# Patient Record
Sex: Male | Born: 1965 | Race: White | Hispanic: No | Marital: Married | Smoking: Never smoker
Health system: Southern US, Community
[De-identification: ages and names within clinical notes are randomized; demographics above are authoritative.]

## PROBLEM LIST (undated history)

## (undated) DIAGNOSIS — H7190 Unspecified cholesteatoma, unspecified ear: Secondary | ICD-10-CM

## (undated) DIAGNOSIS — F4320 Adjustment disorder, unspecified: Secondary | ICD-10-CM

## (undated) DIAGNOSIS — F329 Major depressive disorder, single episode, unspecified: Secondary | ICD-10-CM

## (undated) DIAGNOSIS — F32A Depression, unspecified: Secondary | ICD-10-CM

## (undated) DIAGNOSIS — M1990 Unspecified osteoarthritis, unspecified site: Secondary | ICD-10-CM

## (undated) DIAGNOSIS — Z8249 Family history of ischemic heart disease and other diseases of the circulatory system: Secondary | ICD-10-CM

## (undated) DIAGNOSIS — G43009 Migraine without aura, not intractable, without status migrainosus: Secondary | ICD-10-CM

## (undated) DIAGNOSIS — Z8709 Personal history of other diseases of the respiratory system: Secondary | ICD-10-CM

## (undated) DIAGNOSIS — M4800 Spinal stenosis, site unspecified: Secondary | ICD-10-CM

## (undated) DIAGNOSIS — E785 Hyperlipidemia, unspecified: Secondary | ICD-10-CM

## (undated) DIAGNOSIS — J45909 Unspecified asthma, uncomplicated: Secondary | ICD-10-CM

## (undated) DIAGNOSIS — F319 Bipolar disorder, unspecified: Secondary | ICD-10-CM

## (undated) HISTORY — DX: Bipolar disorder, unspecified: F31.9

## (undated) HISTORY — DX: Hyperlipidemia, unspecified: E78.5

## (undated) HISTORY — DX: Migraine without aura, not intractable, without status migrainosus: G43.009

## (undated) HISTORY — DX: Unspecified cholesteatoma, unspecified ear: H71.90

## (undated) HISTORY — PX: SYNOVIAL CYST EXCISION: SUR507

## (undated) HISTORY — DX: Unspecified osteoarthritis, unspecified site: M19.90

## (undated) HISTORY — DX: Spinal stenosis, site unspecified: M48.00

## (undated) HISTORY — DX: Personal history of other diseases of the respiratory system: Z87.09

## (undated) HISTORY — PX: INNER EAR SURGERY: SHX679

## (undated) HISTORY — DX: Family history of ischemic heart disease and other diseases of the circulatory system: Z82.49

## (undated) HISTORY — DX: Adjustment disorder, unspecified: F43.20

---

## 1996-05-08 HISTORY — PX: OTHER SURGICAL HISTORY: SHX169

## 1996-05-08 LAB — HM COLONOSCOPY: HM Colonoscopy: NORMAL

## 1997-05-08 HISTORY — PX: COLONOSCOPY: SHX174

## 2000-05-08 HISTORY — PX: NECK SURGERY: SHX720

## 2001-05-08 HISTORY — PX: ESOPHAGUS SURGERY: SHX626

## 2004-06-28 ENCOUNTER — Encounter: Payer: Self-pay | Admitting: Family Medicine

## 2007-01-14 ENCOUNTER — Ambulatory Visit: Payer: Self-pay | Admitting: Family Medicine

## 2007-01-14 DIAGNOSIS — M542 Cervicalgia: Secondary | ICD-10-CM | POA: Insufficient documentation

## 2007-01-14 DIAGNOSIS — H9319 Tinnitus, unspecified ear: Secondary | ICD-10-CM | POA: Insufficient documentation

## 2007-01-14 DIAGNOSIS — H6690 Otitis media, unspecified, unspecified ear: Secondary | ICD-10-CM | POA: Insufficient documentation

## 2007-01-14 DIAGNOSIS — J45909 Unspecified asthma, uncomplicated: Secondary | ICD-10-CM | POA: Insufficient documentation

## 2007-01-14 DIAGNOSIS — F319 Bipolar disorder, unspecified: Secondary | ICD-10-CM | POA: Insufficient documentation

## 2007-01-14 DIAGNOSIS — M549 Dorsalgia, unspecified: Secondary | ICD-10-CM | POA: Insufficient documentation

## 2007-01-14 DIAGNOSIS — G43009 Migraine without aura, not intractable, without status migrainosus: Secondary | ICD-10-CM | POA: Insufficient documentation

## 2007-01-22 ENCOUNTER — Encounter: Payer: Self-pay | Admitting: Family Medicine

## 2007-10-10 ENCOUNTER — Ambulatory Visit: Payer: Self-pay | Admitting: Family Medicine

## 2007-10-11 ENCOUNTER — Telehealth: Payer: Self-pay | Admitting: Family Medicine

## 2008-11-24 ENCOUNTER — Ambulatory Visit: Payer: Self-pay | Admitting: Family Medicine

## 2008-11-24 DIAGNOSIS — M79609 Pain in unspecified limb: Secondary | ICD-10-CM | POA: Insufficient documentation

## 2009-12-08 ENCOUNTER — Ambulatory Visit: Payer: Self-pay | Admitting: Unknown Physician Specialty

## 2010-05-08 DIAGNOSIS — H7190 Unspecified cholesteatoma, unspecified ear: Secondary | ICD-10-CM

## 2010-05-08 HISTORY — DX: Unspecified cholesteatoma, unspecified ear: H71.90

## 2010-06-07 NOTE — Assessment & Plan Note (Signed)
Summary: R FOOT/CLE   Vital Signs:  Patient profile:   45 year old male Height:      70.25 inches Weight:      202.25 pounds BMI:     28.92 Temp:     98.4 degrees F oral Pulse rate:   80 / minute Pulse rhythm:   regular BP sitting:   98 / 66  (left arm) Cuff size:   large  Vitals Entered By: Delilah Shan CMA (November 24, 2008 12:00 PM) CC: Right foot pain   History of Present Illness: 1 week of pain in right foot, 3-4/10 Pain with running or wlaking.  Feels like pressure on central foot. Noted lump in central foot. No swelling, no redness.  Has been running a lot, no change in activity. 3-5 miles at a time.  No injury.  No ankle pain.  No previous foot problems.  No OTC  Problems Prior to Update: 1)  Otitis Media, Left  (ICD-382.9) 2)  Back Pain, Upper  (ICD-724.5) 3)  Family History of Cad Male 1st Degree Relative <50  (ICD-V17.3) 4)  Neck Pain, Chronic  (ICD-723.1) 5)  Tinnitus, Chronic  (ICD-388.30) 6)  Common Migraine  (ICD-346.10) 7)  Disorder, Bipolar Nos  (ICD-296.80) 8)  Depression  (ICD-311) 9)  Asthma  (ICD-493.90)  Current Medications (verified): 1)  None  Allergies (verified): No Known Drug Allergies  Past History:  Past medical, surgical, family and social histories (including risk factors) reviewed, and no changes noted (except as noted below).  Past Medical History: Reviewed history from 01/14/2007 and no changes required. Asthma, childhood Depression  Past Surgical History: Reviewed history from 01/14/2007 and no changes required. 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 father age 26 CAD Family History of CAD Male 1st degree relative <50 mother age 30 groin tumor?,  sickle cell anemia, asthma 2  brothers healthy 2 kids (one adopted) healthy PGM: lung cancer PGF: prostate cancer  Social History: Reviewed history from 01/14/2007 and no changes required. 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 CV:  Denies chest pain or discomfort. Resp:  Denies shortness of breath.  Physical Exam  General:  Well-developed,well-nourished,in no acute distress; alert,appropriate and cooperative throughout examination Lungs:  Normal respiratory effort, chest expands symmetrically. Lungs are clear to auscultation, no crackles or wheezes. Heart:  Normal rate and regular rhythm. S1 and S2 normal without gallop, murmur, click, rub or other extra sounds.   Foot/Ankle Exam  General:    Well-developed, well-nourished ,normal body habitus; no deformities, normal grooming.    Gait:    Normal heel-toe gait pattern bilaterally.    Skin:    Intact with no scars, lesions, rashes, cafe-au-lait spots or bruising.    Inspection:    deformity: swelling at 3rd metatarsal head...tender at site as well, no eryhtema, no warmth  Vascular:    dorsalis pedis and posterior tibial pulses 2+ and symmetric, capillary refill < 2 seconds, normal hair pattern, no evidence of ischemia.   Ankle Exam:    Right:    Inspection:  Normal    Palpation:  Normal    Stability:  stable    Swelling:  no    Erythema:  no    Range of Motion:       Dorsiflex-Active: full       Plantar Flex-Active: full       Eversion-Active: full       Inversion-Active: full    Left:    Inspection:  Normal    Palpation:  Normal    Stability:  stable    Swelling:  no    Erythema:  no    Range of Motion:       Dorsiflex-Active: full       Plantar Flex-Active: full       Eversion-Active: full       Inversion-Active:  full  Foot Exam:    Right:    Stability:  stable    Left:    Stability:  stable  Syndesmosis Squeeze Test:    Right negative; Left negative First MTP Stability Test:    Right negative; Left negative   Impression & Recommendations:  Problem # 1:  FOOT PAIN, RIGHT (ICD-729.5) X-ray showed no evidence of March fracture. Will have radiaologist review.  Most likely metatarsalgia, but could be small stress fracture. If not improving in 4 weeks consider repeat X-ray/bone  Limit running x 1-2 weeks. Add metatarsal pads in shoes. NSAIDs, Ice as needed.  Orders: Misc. Referral (Misc. Ref)  Patient Instructions: 1)  Scheduled CPE with fasting labs prior Dx v77.91 lipids, CMET.  2)  Hold running x 1-2 weeks. Use ibuprofen 800 mg every 8 hours as needed pain. 3)  Elevat, ice.  4)  Insert metatarsal pads in shoes. 5)  Follow up in 3-4 weeks if not improving.   Prior Medications (reviewed today): None Current Allergies (reviewed today): No known allergies

## 2010-06-07 NOTE — Assessment & Plan Note (Signed)
Summary: COUGH, CONGESTION & EAR INFECTION  / LFW   Vital Signs:  Patient Profile:   45 Years Old Male Height:     70.25 inches Weight:      206.38 pounds Temp:     98.2 degrees F oral Pulse rate:   76 / minute Pulse rhythm:   regular BP sitting:   102 / 72  (left arm) Cuff size:   large  Vitals Entered By: Delilah Shan (October 10, 2007 3:34 PM)                 Chief Complaint:  Cough, congestion, and ? ear infection.  Acute Visit History:      The patient complains of cough and earache.  These symptoms began 1 week ago.  He denies fever, headache, sinus problems, and sore throat.  Other comments include: body aches using mucinex.        The character of the cough is described as productive.  There is no history of wheezing or shortness of breath associated with his cough.        The earache is located on the left side.  There is no history of recent antibiotic usage associated with the earache.          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 EOMI. Perrla.  Vision grossly normal. Ears:     left TM with white scarring, some wax no clear erythema or pus, Right TM clear Nose:     no external deformity and nasal discharge/mucosal pallor.   Mouth:     Oral mucosa and oropharynx  without lesions or exudates.  Teeth in good repair. Lungs:     Normal respiratory effort, chest expands symmetrically. Lungs are clear to auscultation, no crackles or wheezes. Heart:     Normal rate and regular rhythm. S1 and S2 normal without gallop, murmur, click, rub or other extra sounds. Cervical Nodes:     no cervical or supraclavicular lymphadenopathy     Impression & Recommendations:  Problem # 1:  BRONCHITIS-ACUTE (ICD-466.0)  His updated medication list for this problem includes:    Azithromycin 250 Mg Tabs (Azithromycin) .Marland Kitchen... 2 tab by mouth daily, 1 tab daily  Take antibiotics and other medications as directed. Encouraged to push clear liquids, get enough rest, and take acetaminophen as needed. To be seen in 5-7 days if no improvement, sooner if worse.   Complete Medication List: 1)  Azithromycin 250 Mg Tabs (Azithromycin) .... 2 tab by mouth daily, 1 tab daily  Prescriptions: AZITHROMYCIN 250 MG  TABS (AZITHROMYCIN) 2 tab by mouth daily, 1 tab daily  #6 x 0   Entered and Authorized by:   Kerby Nora MD   Signed by:   Kerby Nora MD on 10/10/2007   Method used:   Electronically sent to ...       [facility]*       [address]       [place], [place]  [address]       Ph: [phone] or [phone]       Fax: [phone]   RxID:   [id number]  ] Prior Medications (reviewed today): None Current Allergies (reviewed today): No known allergies  Current Medications (including changes made in today's visit):  AZITHROMYCIN 250 MG  TABS (AZITHROMYCIN) 2 tab by mouth daily, 1 tab daily

## 2010-06-07 NOTE — Letter (Signed)
Summary: Dr. Ocie Doyne Records  Dr. Ocie Doyne Records   Imported By: Beau Fanny 02/06/2007 16:55:59  _____________________________________________________________________  External Attachment:    Type:   Image     Comment:   External Document

## 2010-06-07 NOTE — Assessment & Plan Note (Signed)
Summary: NEW PATIENT/[facility]   Vital Signs:  Patient Profile:   45 Years Old Male Height:     70.25 inches Weight:      202.50 pounds Temp:     98.5 degrees F oral Pulse rate:   92 / minute Pulse rhythm:   regular BP sitting:   102 / 76  (left arm) Cuff size:   large  Vitals Entered By: Delilah Shan (January 14, 2007 3:30 PM)                 Chief Complaint:  New Patient to Establish.  History of Present Illness: Patient states that he has been on Lexapro and Depakote in the past for bipolar but has worked through it with counseling  to learn coping stratagies, etc. and has not been on it for a year or longer. Rapid cycler bipolar well controlled now treats with exercise   left ear fuid in it, decrease hearing, no pain, some dark brown liquid discharge that smells, wax plug frequently last ear infection 9 months  ago, no recent antibiotics  chronic neck pain, tried massage, PT chiropractor around T2-T3 Had previous surgery for c5-C6    Current Allergies: No known allergies   Past Medical History:    Asthma, childhood    Depression  Past Surgical History:    Hosp 2004 for depression, bipolar d/o, overdose ambien     mastoid tumor, left ear: s/p 2 surgeries 1975 and 1995    2003 torn esophagus    2002 neck surgery, spinal stenisis and arthritis    perianal abcess 1998   Family History:    father age 15 CAD    Family History of CAD Male 1st degree relative <50    mother age 30 groin tumor?,  sickle cell anemia, asthma    2 brothers healthy    2 kids (one adopted) healthy    PGM: lung cancer    PGF: prostate cancer      Social History:    Occupation: Runner, 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 No apparent alopecia or balding. Eyes:     No corneal or conjunctival inflammation noted. EOMI. Perrla.  Vision grossly normal. Ears:     haering aid right ear, l TM with purulent exudate and bulging Nose:     External nasal examination shows no deformity or inflammation. Nasal mucosa are pink and moist without lesions or exudates. Mouth:     Oral mucosa and oropharynx without lesions or exudates.  Teeth in good repair. Lungs:     Normal respiratory effort, chest expands symmetrically. Lungs are clear to auscultation, no crackles or wheezes. Heart:     Normal rate and regular rhythm. S1 and S2 normal without gallop, murmur, click, rub or other  extra sounds. Abdomen:     Bowel sounds positive,abdomen soft and non-tender without masses, organomegaly or hernias noted. Msk:     R erector spinae mucscle complex ttp neg straight leg neg faber's     Impression & Recommendations:  Problem # 1:  BACK PAIN, UPPER (ICD-724.5) Stretching exercises, NSAID, heat.  f/U as needed  Orders: Radiology Referral (Radiology) Check thoraic spine films  His updated medication list for this problem includes:    Arthrotec 75 75-200 Mg-mcg Tab (Diclofenac-misoprostol) .Marland Kitchen... Take one (1) tablet by mouth twice a day (with food).   Problem # 2:  OTITIS MEDIA, LEFT (ICD-382.9) Treat with antinbiotics, call if no improving. His updated medication list for this problem includes:    Arthrotec 75 75-200 Mg-mcg Tab (Diclofenac-misoprostol) .Marland Kitchen... Take one (1) tablet by mouth twice a day (with food).    Amoxicillin 500 Mg Cap (Amoxicillin) .Marland Kitchen... 2 caps by mouth two times a  day x 10 days Instructed on prevention and treatment. Call if no improvement in 48-72 hours or sooner if worsening symptoms.   Complete Medication List: 1)  Arthrotec 75 75-200 Mg-mcg Tab (Diclofenac-misoprostol) .... Take one (1) tablet by mouth twice a day (with food). 2)  Amoxicillin 500 Mg Cap (Amoxicillin) .... 2 caps by mouth two times a day x 10 days   Patient Instructions: 1)  Please schedule a follow-up appointment in 2 weeks if no improvement in pain. 2)  Referral Appointment Information 3)  Day/Date: 4)  Time: 5)  Place/MD: 6)  Address: 7)  Phone/Fax: 8)  Patient given appointment information. Information/Orders faxed/mailed.     Prescriptions: AMOXICILLIN 500 MG CAP (AMOXICILLIN) 2 caps by mouth two times a day x 10 days  #40 x 0   Entered and Authorized by:   Kerby Nora MD   Signed by:   Kerby Nora MD on 01/14/2007   Method used:   Print then Give to Patient   RxID:   [id number] ARTHROTEC 75 75-200 MG-MCG TAB (DICLOFENAC-MISOPROSTOL) Take one (1) tablet by mouth twice a day (with food).  #30 x 0   Entered and Authorized by:   Kerby Nora MD   Signed by:   Kerby Nora MD on 01/14/2007   Method used:   Print then Give to Patient   RxID:   [id number]   Prior Medications: ARTHROTEC 75 75-200 MG-MCG TAB (DICLOFENAC-MISOPROSTOL) Take one (1) tablet by mouth twice a day (with food). AMOXICILLIN 500 MG CAP (AMOXICILLIN) 2 caps by mouth two times a day x 10 days Current Allergies: No known allergies   Appended Document: NEW PATIENT/[facility]    Clinical Lists Changes  Observations: Added new observation of TD BOOSTER: Historical (11/18/2003 15:21)        Tetanus/Td Immunization History:    Tetanus/Td # 1:  Historical (11/18/2003)

## 2010-06-07 NOTE — Letter (Signed)
Summary: MEDICAL RELEASE FORM  MEDICAL RELEASE FORM   Imported By: Carrie Mew 01/22/2007 08:44:40  _____________________________________________________________________  External Attachment:    Type:   Image     Comment:   External Document

## 2010-07-05 ENCOUNTER — Other Ambulatory Visit: Payer: Self-pay | Admitting: Family Medicine

## 2010-07-05 ENCOUNTER — Ambulatory Visit (INDEPENDENT_AMBULATORY_CARE_PROVIDER_SITE_OTHER): Payer: BC Managed Care – PPO | Admitting: Family Medicine

## 2010-07-05 ENCOUNTER — Encounter: Payer: Self-pay | Admitting: Family Medicine

## 2010-07-05 ENCOUNTER — Telehealth: Payer: Self-pay | Admitting: Family Medicine

## 2010-07-05 DIAGNOSIS — F319 Bipolar disorder, unspecified: Secondary | ICD-10-CM

## 2010-07-06 LAB — BASIC METABOLIC PANEL
BUN: 18 mg/dL (ref 6–23)
CO2: 31 mEq/L (ref 19–32)
Calcium: 9.3 mg/dL (ref 8.4–10.5)
Chloride: 106 mEq/L (ref 96–112)
Creatinine, Ser: 1 mg/dL (ref 0.4–1.5)
GFR: 85.47 mL/min (ref >60.00)
Glucose, Bld: 76 mg/dL (ref 70–99)
Potassium: 4.6 mEq/L (ref 3.5–5.1)
Sodium: 143 mEq/L (ref 135–145)

## 2010-07-06 LAB — HEPATIC FUNCTION PANEL
ALT: 55 U/L — ABNORMAL HIGH (ref 0–53)
AST: 30 U/L (ref 0–37)
Albumin: 4.4 g/dL (ref 3.5–5.2)
Alkaline Phosphatase: 100 U/L (ref 39–117)
Bilirubin, Direct: 0.1 mg/dL (ref 0.0–0.3)
Total Bilirubin: 0.6 mg/dL (ref 0.3–1.2)
Total Protein: 6.9 g/dL (ref 6.0–8.3)

## 2010-07-06 LAB — TSH: TSH: 1.06 u[IU]/mL (ref 0.35–5.50)

## 2010-07-08 ENCOUNTER — Telehealth (INDEPENDENT_AMBULATORY_CARE_PROVIDER_SITE_OTHER): Payer: Self-pay | Admitting: *Deleted

## 2010-07-12 ENCOUNTER — Encounter (INDEPENDENT_AMBULATORY_CARE_PROVIDER_SITE_OTHER): Payer: Self-pay | Admitting: *Deleted

## 2010-07-12 ENCOUNTER — Encounter: Payer: Self-pay | Admitting: Family Medicine

## 2010-07-12 ENCOUNTER — Other Ambulatory Visit: Payer: Self-pay | Admitting: Family Medicine

## 2010-07-12 ENCOUNTER — Other Ambulatory Visit (INDEPENDENT_AMBULATORY_CARE_PROVIDER_SITE_OTHER): Payer: BC Managed Care – PPO

## 2010-07-12 DIAGNOSIS — F319 Bipolar disorder, unspecified: Secondary | ICD-10-CM

## 2010-07-13 LAB — CBC WITH DIFFERENTIAL/PLATELET
Basophils Absolute: 0 10*3/uL (ref 0.0–0.1)
Basophils Relative: 0.2 % (ref 0.0–3.0)
Eosinophils Absolute: 0.2 10*3/uL (ref 0.0–0.7)
Eosinophils Relative: 2.6 % (ref 0.0–5.0)
HCT: 42.9 % (ref 39.0–52.0)
Hemoglobin: 14.8 g/dL (ref 13.0–17.0)
Lymphocytes Relative: 33.9 % (ref 12.0–46.0)
Lymphs Abs: 2.4 10*3/uL (ref 0.7–4.0)
MCHC: 34.5 g/dL (ref 30.0–36.0)
MCV: 88.4 fl (ref 78.0–100.0)
Monocytes Absolute: 0.6 10*3/uL (ref 0.1–1.0)
Monocytes Relative: 8.2 % (ref 3.0–12.0)
Neutro Abs: 3.9 10*3/uL (ref 1.4–7.7)
Neutrophils Relative %: 55.1 % (ref 43.0–77.0)
Platelets: 228 10*3/uL (ref 150.0–400.0)
RBC: 4.85 Mil/uL (ref 4.22–5.81)
RDW: 13 % (ref 11.5–14.6)
WBC: 7.1 10*3/uL (ref 4.5–10.5)

## 2010-07-13 LAB — CONVERTED CEMR LAB: Valproic Acid Lvl: 87.9 ug/mL (ref 50.0–100.0)

## 2010-07-14 NOTE — Assessment & Plan Note (Signed)
Summary: DEPRESSION/CLE   [employer]   Vital Signs:  Patient profile:   45 year old male Weight:      205.75 pounds BMI:     29.42 Temp:     98.5 degrees F oral Pulse rate:   80 / minute Pulse rhythm:   regular BP sitting:   104 / 78  (left arm) Cuff size:   large  Vitals Entered By: [name] Dance CMA (AAMA) (July 05, 2010 3:11 PM) CC: Depression   History of Present Illness: CC: depression  h/o bipolar.  2004 - hospitalized psych in [place] for "sucide attempt" but actually ambien overdose because was trying to get some sleep.  denies trying to hurt himself.  there found to have bipolar d/o.  Was taking lexapro 20, depakote, and risperdal.  had been in with good psychiatric support network (psychiatrist, psychologist, counselors, etc).   then moved over last several years.  2006 moved to [place].  lost support.  had been controlling bipolar with diet changes and exercise.    Moved to [place] in last few years.  Recently worsening stressors esp last 2-3 wks.  Mentor of last 27 years passed away last week ([place]), has been dealing with this.  Marriage issues.  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  Pt feels like falling apart.  Doesn't want to get to point where he was back in [place].    mother Charity fundraiser at [employer].  Has started taking lexapro 10mg  daily (samples) for last several months.  Several ear issues recently - h/o cholesteatoma with R ear surgery 05/2010 and residual tinnitus.  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  another stressor.  Sxs bipolar include: big mood swings (easily agitated), erratic, short of temper, hyper.  Has not had manic sxs or able to go any duration of time without sleep.  No recent pleasure spending, not more goal directed, no pressured speech.  Does have trouble with sleep but due to tinnitus.  No SI/HI currently.  + frustration.  Current Medications (verified): 1)  Lexapro 10 Mg Tabs (Escitalopram Oxalate) .... 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   Neck:  No deformities, masses, or tenderness noted.  no TM Lungs:  Normal respiratory effort, chest expands symmetrically. Lungs are clear to auscultation, no crackles or wheezes. Heart:  Normal rate and regular rhythm. S1 and S2 normal without gallop, murmur, click, rub or other extra sounds. Psych:  Cognition and judgment appear intact. Alert and cooperative with normal attention span and concentration. No apparent delusions, illusions, hallucinations   Impression & Recommendations:  Problem # 1:  DISORDER, BIPOLAR NOS (ICD-296.80) Assessment Deteriorated referral to psych for initial eval given h/o bipolar although unsure if currently meeting criteria.  anticipate bipolar II.  does have deterioration recently, so start previous regimen of continued  lexapro and depakote.  not currently elevated mood so ok to continue lexapro.  blood work today for baseline.  currently not acute decompensated, no SI/HI.    considered starting antipsychotic (risperdal) but decided to go with lexapro/depakote as this was previous maintenance regimen and did well for him.  denies any current or past substance abuse.    will also get him set up with  psychology referral esp while get in to see psychiatrist.  Orders: Venipuncture ([address]) TLB-BMP (Basic Metabolic Panel-BMET) (80048-METABOL) TLB-TSH (Thyroid Stimulating Hormone) (84443-TSH) TLB-Hepatic/Liver Function Pnl (80076-HEPATIC) Psychiatric Referral (Psych) Psychology Referral (Psychology)  Problem # 2:  ADJUSTMENT DISORDER (ICD-309.9)  recent stressors in setting of h/o bipolar d/o.  likely aggravating situation.  pt interested in counseling.  given hx bipolar, start with psychiatric referral.    Orders: Psychology Referral (Psychology)  Complete Medication List: 1)  Lexapro 10 Mg Tabs (Escitalopram oxalate) .... One daily 2)  Valproic Acid 250 Mg Caps (Valproic acid) .... One by mouth three times a day  Patient Instructions: 1)  Return here in 2 weeks for follow up. 2)  Continue lexapro for now at 10mg  daily. 3)  start depakote (valproic acid) 250mg  three times a day. 4)  Return monday for repeat blood work [valproic acid level, CBC 296.8] 5)  I'll check with Dr. Ermalene Searing about setting up with psychiatry or psychology. 6)  Good to see you today, call us with any quesitons. Prescriptions: LEXAPRO 10 MG TABS (ESCITALOPRAM OXALATE) one daily  #30 x 1   Entered and Authorized by:   [name]  MD   Signed by:   [name]  MD on 07/05/2010   Method used:   Electronically to        [facility]* (retail)       [address]       [place], [place]  [address]       Ph: [phone]       Fax: [phone]   RxID:   [id number] VALPROIC ACID 250 MG CAPS (VALPROIC ACID) one by mouth three times a day  #90 x 1   Entered and Authorized by:   [name]  MD   Signed by:   [name]  MD on 07/05/2010   Method used:   Electronically to        [facility]* (retail)       [address]       [place], [place]  [address]       Ph: [phone]       Fax: [phone]   RxID:   [id number]    Orders Added: 1)  Venipuncture  [[address]] 2)  TLB-BMP (Basic Metabolic Panel-BMET) [80048-METABOL] 3)  TLB-TSH (Thyroid Stimulating Hormone) [84443-TSH] 4)  TLB-Hepatic/Liver Function Pnl [80076-HEPATIC] 5)  Est. Patient Level IV [[address]] 6)  Psychiatric Referral [Psych] 7)  Psychology Referral [Psychology]    Prior Medications: Current Allergies (reviewed today): No known allergies

## 2010-07-14 NOTE — Progress Notes (Signed)
----   Converted from flag ---- ---- 07/05/2010 6:25 PM, [name]  MD wrote: when pt returns next week I'd like [valproic acid level, CBC 296.8] checked. thanks. ------------------------------

## 2010-07-14 NOTE — Progress Notes (Signed)
Summary: lexapro is not covered by insurance  Phone Note From Pharmacy   Caller: [facility]* Summary of Call: Pharmacy has faxed a note stating lexapro is not covered by his insurance.  They will cover celexa, prozac, paxil or zoloft.          Lowella Petties CMA, AAMA  July 05, 2010 4:28 PM   Follow-up for Phone Call        in that case will have patient taper off lexapro (1/2 pill daily for 1 wk then stop if able to cut in half), just taper up depakote.  will readjust med dose based on next week lab visit for depakote level and CBC.  please call and notify above.  also notify we are setting him up for psychiatry and psychology referral. Follow-up by: [name]  MD,  July 05, 2010 6:23 PM  Additional Follow-up for Phone Call Additional follow up Details #1::        Message left for patient to return my call. Kim Dance CMA Duncan Dull)  July 06, 2010 11:44 AM   Advised pharmacist at [facility] drug that a new medicine would not be called in right now.  I asked them to have pt call us if they hear from him before we do.            Lowella Petties CMA, AAMA  July 06, 2010 12:22 PM     Additional Follow-up for Phone Call Additional follow up Details #2::    Spoke with pt and instructed as above.               Lowella Petties CMA, AAMA  July 06, 2010 1:23 PM

## 2010-07-19 ENCOUNTER — Encounter: Payer: Self-pay | Admitting: Family Medicine

## 2010-07-19 ENCOUNTER — Ambulatory Visit (INDEPENDENT_AMBULATORY_CARE_PROVIDER_SITE_OTHER): Payer: BC Managed Care – PPO | Admitting: Family Medicine

## 2010-07-19 DIAGNOSIS — F319 Bipolar disorder, unspecified: Secondary | ICD-10-CM

## 2010-07-20 ENCOUNTER — Ambulatory Visit (INDEPENDENT_AMBULATORY_CARE_PROVIDER_SITE_OTHER): Payer: BC Managed Care – PPO | Admitting: Psychology

## 2010-07-20 DIAGNOSIS — F4323 Adjustment disorder with mixed anxiety and depressed mood: Secondary | ICD-10-CM

## 2010-07-20 DIAGNOSIS — F319 Bipolar disorder, unspecified: Secondary | ICD-10-CM

## 2010-07-26 NOTE — Assessment & Plan Note (Signed)
Summary: 2WK FOLLOW UP / LFW   Vital Signs:  Patient profile:   45 year old male Weight:      204.25 pounds Temp:     98.4 degrees F oral Pulse rate:   80 / minute Pulse rhythm:   regular BP sitting:   108 / 62  (left arm) Cuff size:   large  Vitals Entered By: [name] Dance CMA Duncan Dull) (July 19, 2010 3:59 PM) CC: 2 week follow up   History of Present Illness: CC: 2 wk f/u  things geting better.  mood overall better.  not feeling depressed.  started feeling better on celexa, not so much on depakote.  Has appt tomorrow 4:30pm with Dr. Laymond Purser.  Has initial appt April with psych.  Taking depakote three times a day.  taking celexa daily in am.  stopped all caffeine.  (was 1 pot/day).  no headaches.  stopped caffeine because felt causing to be to hyper/irritable.  nausea/vomiting/diarrhea over weekend.  yesterday still had some diarrhea.  No more vomiting.  Pt out of work yesterday with illness.  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   Mouth:  MMM Lungs:  Normal respiratory effort, chest expands symmetrically. Lungs are clear to auscultation, no crackles or wheezes. Heart:  Normal rate and regular rhythm. S1 and S2 normal without  gallop, murmur, click, rub or other extra sounds. Pulses:  2+ rad pulses, brisk cap refill Psych:  Cognition and judgment appear intact. Alert and cooperative with normal attention span and concentration. No apparent delusions, illusions, hallucinations   Impression & Recommendations:  Problem # 1:  DISORDER, BIPOLAR NOS (ICD-296.80) improving on current regimen, feels better.  interestingly started feelign better only once antidepressant started.  ? true bipolar (II) vs depression. has appt tomorrow with Dr. Laymond Purser for CBT.   awaiting psychiatry referral 08/2010. return next week for blood work (prior to depakote dose in am), titrate according to level. RTC 1 mo. no SI/HI.  Complete Medication List: 1)  Valproic Acid 250 Mg Caps (Valproic acid) .... One by mouth three times a day 2)  Celexa 10 Mg Tabs (Citalopram hydrobromide) .... Take one daily for mood  Patient Instructions: 1)  Return in 1 month for f/u with myself or Dr. Ermalene Searing. 2)  Come in next week for blood work [valproic level 296.8] 3)  we will contact you with results and changes if any.   4)  No changes currently. 5)  good to see you today, call clinic with questions.   Orders Added: 1)  Est. Patient Level III [16109]    Current Allergies (reviewed today): No known allergies

## 2010-07-28 ENCOUNTER — Other Ambulatory Visit (INDEPENDENT_AMBULATORY_CARE_PROVIDER_SITE_OTHER): Payer: BC Managed Care – PPO | Admitting: Family Medicine

## 2010-07-28 DIAGNOSIS — F319 Bipolar disorder, unspecified: Secondary | ICD-10-CM

## 2010-07-29 ENCOUNTER — Telehealth (INDEPENDENT_AMBULATORY_CARE_PROVIDER_SITE_OTHER): Payer: BC Managed Care – PPO | Admitting: Family Medicine

## 2010-07-29 DIAGNOSIS — F3189 Other bipolar disorder: Secondary | ICD-10-CM

## 2010-07-29 DIAGNOSIS — F3181 Bipolar II disorder: Secondary | ICD-10-CM

## 2010-07-29 LAB — VALPROIC ACID LEVEL: Valproic Acid Lvl: 53.5 ug/mL (ref 50.0–100.0)

## 2010-07-29 MED ORDER — VALPROIC ACID 500 MG PO CPDR: 1.0000 | DELAYED_RELEASE_CAPSULE | Freq: Two times a day (BID) | ORAL | Status: DC

## 2010-07-29 NOTE — Telephone Encounter (Signed)
Message copied by [name] on Fri Jul 29, 2010  2:06 PM ------      Message from: [place], [place]      Created: Fri Jul 29, 2010  8:34 AM       Depakote in therapeutic range... Let pt know.      Keep appt with psychiatry in April.       I will forward this note and lab to Dr. Reece Agar who ordered it... He may have further instructions.

## 2010-07-29 NOTE — Telephone Encounter (Signed)
Level low limits therapeutic.  Room to go up.  Will recommend pt increase depakote to 500mg  bid (easier dosing as well). Please have him come in 3 wks for lab visit first thing in morning prior to 1st depakote dose [CBC, CMP, valproic level 296.8].  Already placed in chart. Keep appt with myself or PCP for April, keep appt with psychiatry. Notify us if questions.  Please fax copy of labwork as well as last 2 OV to psychiatry (appt scheduled 08/2010).

## 2010-08-01 ENCOUNTER — Telehealth: Payer: Self-pay | Admitting: *Deleted

## 2010-08-01 NOTE — Telephone Encounter (Signed)
Depakote will help with that.  If after a few days of new dose not feeling better, to let us know, will likely start risperdal. If worsening sooner, again let us know.

## 2010-08-01 NOTE — Telephone Encounter (Signed)
Noted  

## 2010-08-01 NOTE — Telephone Encounter (Signed)
Patient notified and will update [place] if not any improvement or if worsening symptoms.

## 2010-08-01 NOTE — Telephone Encounter (Signed)
Pt states his depakote was increased from 3 to 4 a day.  Because of the increase he will need a new script called to [facility].

## 2010-08-01 NOTE — Telephone Encounter (Signed)
Patient notified and lab appt scheduled. He said he is feeling okay with the depression issues but is really starting to feel very manic. He wanted to know if the depakote increase will help with that. He said it is really starting to bother him. I told him I would check and give him a call back.

## 2010-08-02 NOTE — Telephone Encounter (Signed)
Patient notified and verbalized understanding. 

## 2010-08-02 NOTE — Telephone Encounter (Signed)
Please notify ok to take 4 daily until runs out, but clarify with patient.  New dose will be 500mg  (increase from previous 250mg  dose) at a time so only needs to take 2 daily (one in am and one in pm).  Update me if any questions.  Already sent med to [facility]

## 2010-08-03 ENCOUNTER — Ambulatory Visit (INDEPENDENT_AMBULATORY_CARE_PROVIDER_SITE_OTHER): Payer: BC Managed Care – PPO | Admitting: Psychology

## 2010-08-03 DIAGNOSIS — F319 Bipolar disorder, unspecified: Secondary | ICD-10-CM

## 2010-08-03 DIAGNOSIS — F4323 Adjustment disorder with mixed anxiety and depressed mood: Secondary | ICD-10-CM

## 2010-08-23 ENCOUNTER — Encounter: Payer: Self-pay | Admitting: Family Medicine

## 2010-08-23 ENCOUNTER — Other Ambulatory Visit: Payer: BC Managed Care – PPO

## 2010-08-23 ENCOUNTER — Ambulatory Visit (INDEPENDENT_AMBULATORY_CARE_PROVIDER_SITE_OTHER): Payer: BC Managed Care – PPO | Admitting: Psychology

## 2010-08-23 DIAGNOSIS — F4323 Adjustment disorder with mixed anxiety and depressed mood: Secondary | ICD-10-CM

## 2010-08-23 DIAGNOSIS — F319 Bipolar disorder, unspecified: Secondary | ICD-10-CM

## 2010-08-24 ENCOUNTER — Other Ambulatory Visit (INDEPENDENT_AMBULATORY_CARE_PROVIDER_SITE_OTHER): Payer: BC Managed Care – PPO | Admitting: Family Medicine

## 2010-08-24 DIAGNOSIS — F3189 Other bipolar disorder: Secondary | ICD-10-CM

## 2010-08-24 DIAGNOSIS — F3181 Bipolar II disorder: Secondary | ICD-10-CM

## 2010-08-24 LAB — CBC WITH DIFFERENTIAL/PLATELET
Basophils Absolute: 0 10*3/uL (ref 0.0–0.1)
Basophils Relative: 0.7 % (ref 0.0–3.0)
Eosinophils Absolute: 0.2 10*3/uL (ref 0.0–0.7)
Eosinophils Relative: 3.8 % (ref 0.0–5.0)
HCT: 45.7 % (ref 39.0–52.0)
Hemoglobin: 15.7 g/dL (ref 13.0–17.0)
Lymphocytes Relative: 32.6 % (ref 12.0–46.0)
Lymphs Abs: 1.9 10*3/uL (ref 0.7–4.0)
MCHC: 34.3 g/dL (ref 30.0–36.0)
MCV: 89.3 fl (ref 78.0–100.0)
Monocytes Absolute: 0.6 10*3/uL (ref 0.1–1.0)
Monocytes Relative: 10.4 % (ref 3.0–12.0)
Neutro Abs: 3 10*3/uL (ref 1.4–7.7)
Neutrophils Relative %: 52.5 % (ref 43.0–77.0)
Platelets: 176 10*3/uL (ref 150.0–400.0)
RBC: 5.12 Mil/uL (ref 4.22–5.81)
RDW: 14 % (ref 11.5–14.6)
WBC: 5.8 10*3/uL (ref 4.5–10.5)

## 2010-08-24 LAB — COMPREHENSIVE METABOLIC PANEL
ALT: 20 U/L (ref 0–53)
AST: 16 U/L (ref 0–37)
Albumin: 4 g/dL (ref 3.5–5.2)
Alkaline Phosphatase: 56 U/L (ref 39–117)
BUN: 22 mg/dL (ref 6–23)
CO2: 30 mEq/L (ref 19–32)
Calcium: 9.1 mg/dL (ref 8.4–10.5)
Chloride: 106 mEq/L (ref 96–112)
Creatinine, Ser: 0.9 mg/dL (ref 0.4–1.5)
GFR: 92.88 mL/min (ref >60.00)
Glucose, Bld: 83 mg/dL (ref 70–99)
Potassium: 4.8 mEq/L (ref 3.5–5.1)
Sodium: 143 mEq/L (ref 135–145)
Total Bilirubin: 0.7 mg/dL (ref 0.3–1.2)
Total Protein: 6.8 g/dL (ref 6.0–8.3)

## 2010-08-24 NOTE — Telephone Encounter (Signed)
Addended by: Adriana Simas on: 08/24/2010 09:21 AM   Modules accepted: Orders

## 2010-08-25 ENCOUNTER — Ambulatory Visit (INDEPENDENT_AMBULATORY_CARE_PROVIDER_SITE_OTHER): Payer: BC Managed Care – PPO | Admitting: Family Medicine

## 2010-08-25 ENCOUNTER — Encounter: Payer: Self-pay | Admitting: Family Medicine

## 2010-08-25 ENCOUNTER — Telehealth: Payer: Self-pay | Admitting: Family Medicine

## 2010-08-25 VITALS — BP 136/80 | HR 84 | Temp 98.6°F | Wt 209.0 lb

## 2010-08-25 DIAGNOSIS — F319 Bipolar disorder, unspecified: Secondary | ICD-10-CM

## 2010-08-25 DIAGNOSIS — R21 Rash and other nonspecific skin eruption: Secondary | ICD-10-CM | POA: Insufficient documentation

## 2010-08-25 LAB — VALPROIC ACID LEVEL: Valproic Acid Lvl: 44.6 ug/mL — ABNORMAL LOW (ref 50.0–100.0)

## 2010-08-25 MED ORDER — VALPROIC ACID 500 MG PO CPDR: 1.0000 | DELAYED_RELEASE_CAPSULE | Freq: Three times a day (TID) | ORAL | Status: DC

## 2010-08-25 MED ORDER — RISPERIDONE 2 MG PO TABS: 3.0000 mg | ORAL_TABLET | Freq: Every day | ORAL | Status: DC

## 2010-08-25 MED ORDER — TRIAMCINOLONE ACETONIDE 0.1 % EX CREA: TOPICAL_CREAM | Freq: Two times a day (BID) | CUTANEOUS | Status: DC

## 2010-08-25 NOTE — Patient Instructions (Signed)
Increase depakote to 750mg  at night, 500mg  in AM for 4days then 500mg  three times daily. Start risperdal at 3 mg daily. Return in 2 wks for follow up, day prior for blood work.

## 2010-08-25 NOTE — Assessment & Plan Note (Addendum)
Not good control as evidenced by this past week increased irritability, angry outburst at school. Tolerating meds well. Continue to titrate up valproate, see pt instructions.  F/u 2 wks. Previously when treated was on lexapro, valporate and risperdal (seemed to need all three). Add on risperdal 3mg  daily. Discussed always has option of ER if things getting out of hand.

## 2010-08-25 NOTE — Assessment & Plan Note (Signed)
Seems consistent with a contact/irritant dermatitis. Treat with TCI cream.  Update if not better.

## 2010-08-25 NOTE — Telephone Encounter (Signed)
Please fax latest OV to psychiatry. Please notify patient I've sent in steroid cream for itchy rash, I forgot to mention this.  I think it's an allergic response to something he's exposed to. Update me if questions.

## 2010-08-25 NOTE — Progress Notes (Signed)
  Subjective:    Patient ID: Erik Smith, male    DOB: November 30, 1965, 45 y.o.   MRN: [id number]  HPI CC: f/u bipolar  depakote increased end of March.  Level yesterday am low at 46.  With intial titration of dose felt sedated/somnolent initially.  Last week vacation in [place], did well.  This week feels wired.  Sometimes feeling "out of control", no impulse control, angry, irritable.  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  Compliant with meds. Taking valproic acid 500mg  in am and 500mg  in pm as well as celexa 10mg  daily.  Tolerating well without side effects other than above.  Saw Dr. Laymond Purser for CBT.  Scheduled to see psychiatry May 9th (had to reschedule).  Still in grad school (significant stress) and with some stress at home.  Not sleeping well overall.  No mania, grandiosity.  No SI/HI.  Also noticing rash returning (had been present even prior to depakote, resolved with time).  Itchy.  Medications and allergies reviewed and updated as above. Current Outpatient Prescriptions on File Prior to Visit  Medication Sig Dispense Refill  . citalopram (CELEXA) 10 MG tablet Take 10 mg by mouth daily.        . Valproic Acid 500 MG CPDR Take 1 capsule (500 mg total) by mouth 2 (two) times daily.  60 each  3     Review of Systems Per HPI    Objective:   Physical Exam  Constitutional: He appears well-developed and well-nourished. No distress.  Neurological: He is alert.  Skin: Skin is warm and dry. Rash (itchy erythematous rash wrist R>L, macular, none between digits.) noted.  Psychiatric: His speech is normal and behavior is normal. Judgment and thought content normal. His mood appears not anxious. His affect is labile (slight). His affect is not angry, not blunt and not inappropriate. Cognition and memory are normal. He does not exhibit a depressed mood.          Assessment & Plan:

## 2010-08-26 NOTE — Telephone Encounter (Signed)
Dr. Mordecai Rasmussen.

## 2010-08-26 NOTE — Telephone Encounter (Signed)
Message left notifying patient of Rx. Notes faxed to psych as directed

## 2010-08-26 NOTE — Telephone Encounter (Signed)
Do you know who his psych is? I saw he saw Dr. Laymond Purser but was awaiting another appt and can't locate who he saw. Thanks!

## 2010-09-05 ENCOUNTER — Other Ambulatory Visit: Payer: BC Managed Care – PPO

## 2010-09-06 ENCOUNTER — Other Ambulatory Visit (INDEPENDENT_AMBULATORY_CARE_PROVIDER_SITE_OTHER): Payer: BC Managed Care – PPO

## 2010-09-06 DIAGNOSIS — F319 Bipolar disorder, unspecified: Secondary | ICD-10-CM

## 2010-09-06 LAB — CBC WITH DIFFERENTIAL/PLATELET
Basophils Absolute: 0 10*3/uL (ref 0.0–0.1)
Basophils Relative: 0.5 % (ref 0.0–3.0)
Eosinophils Absolute: 0.2 10*3/uL (ref 0.0–0.7)
Eosinophils Relative: 3.8 % (ref 0.0–5.0)
HCT: 44.8 % (ref 39.0–52.0)
Hemoglobin: 15.6 g/dL (ref 13.0–17.0)
Lymphocytes Relative: 34.6 % (ref 12.0–46.0)
Lymphs Abs: 1.8 10*3/uL (ref 0.7–4.0)
MCHC: 34.8 g/dL (ref 30.0–36.0)
MCV: 88.6 fl (ref 78.0–100.0)
Monocytes Absolute: 0.8 10*3/uL (ref 0.1–1.0)
Monocytes Relative: 14.4 % — ABNORMAL HIGH (ref 3.0–12.0)
Neutro Abs: 2.4 10*3/uL (ref 1.4–7.7)
Neutrophils Relative %: 46.7 % (ref 43.0–77.0)
Platelets: 206 10*3/uL (ref 150.0–400.0)
RBC: 5.05 Mil/uL (ref 4.22–5.81)
RDW: 14 % (ref 11.5–14.6)
WBC: 5.2 10*3/uL (ref 4.5–10.5)

## 2010-09-06 LAB — COMPREHENSIVE METABOLIC PANEL
ALT: 21 U/L (ref 0–53)
AST: 18 U/L (ref 0–37)
Albumin: 3.9 g/dL (ref 3.5–5.2)
Alkaline Phosphatase: 43 U/L (ref 39–117)
BUN: 15 mg/dL (ref 6–23)
CO2: 30 mEq/L (ref 19–32)
Calcium: 9.1 mg/dL (ref 8.4–10.5)
Chloride: 105 mEq/L (ref 96–112)
Creatinine, Ser: 1 mg/dL (ref 0.4–1.5)
GFR: 88.46 mL/min (ref >60.00)
Glucose, Bld: 95 mg/dL (ref 70–99)
Potassium: 4.7 mEq/L (ref 3.5–5.1)
Sodium: 141 mEq/L (ref 135–145)
Total Bilirubin: 0.6 mg/dL (ref 0.3–1.2)
Total Protein: 6.6 g/dL (ref 6.0–8.3)

## 2010-09-07 ENCOUNTER — Ambulatory Visit (INDEPENDENT_AMBULATORY_CARE_PROVIDER_SITE_OTHER): Payer: BC Managed Care – PPO | Admitting: Family Medicine

## 2010-09-07 ENCOUNTER — Encounter: Payer: Self-pay | Admitting: Family Medicine

## 2010-09-07 VITALS — BP 112/64 | HR 84 | Temp 98.6°F | Wt 214.1 lb

## 2010-09-07 DIAGNOSIS — F319 Bipolar disorder, unspecified: Secondary | ICD-10-CM

## 2010-09-07 DIAGNOSIS — R21 Rash and other nonspecific skin eruption: Secondary | ICD-10-CM

## 2010-09-07 DIAGNOSIS — W57XXXA Bitten or stung by nonvenomous insect and other nonvenomous arthropods, initial encounter: Secondary | ICD-10-CM

## 2010-09-07 LAB — VALPROIC ACID LEVEL: Valproic Acid Lvl: 70.7 ug/mL (ref 50.0–100.0)

## 2010-09-07 LAB — POCT SKIN KOH: Skin KOH, POC: NEGATIVE

## 2010-09-07 MED ORDER — VALPROIC ACID 250 MG PO CAPS: ORAL_CAPSULE | ORAL | Status: DC

## 2010-09-07 MED ORDER — CLOTRIMAZOLE 1 % EX CREA: TOPICAL_CREAM | Freq: Two times a day (BID) | CUTANEOUS | Status: AC

## 2010-09-07 MED ORDER — CITALOPRAM HYDROBROMIDE 10 MG PO TABS: 10.0000 mg | ORAL_TABLET | Freq: Every day | ORAL | Status: DC

## 2010-09-07 NOTE — Assessment & Plan Note (Addendum)
Initially thought to have contact dermatitis component, still likely part of this. Given scaling - checked KOH = negative.  However, as scaled on steroids, treat with clotrimazole as well.  Will send in.   Triamcinolone cream hasn't seemed to help. ? dyshydrotic eczema? Doubt scabies as not intensely pruritic.  Doubt valproate reaction as started prior to that med's commencement.   Update [place] if not better, consider referral to derm.

## 2010-09-07 NOTE — Assessment & Plan Note (Signed)
Removed head of tick, nothing remains. Discussed red flags to call us for abx course.  Pt reliable.

## 2010-09-07 NOTE — Patient Instructions (Signed)
Increase valproate to 500mg  in am, 750mg  at noon and 750mg  at dinner for 1 week then increase to 750mg  three times a day. Watch for fever, headache, abd pain, muscle aches, new rash on arms, confusion in next 7-10 days.  If case, call us for antibiotic. Return in 2 weeks for recheck valproate level and in 1 month for follow up office visit.

## 2010-09-07 NOTE — Progress Notes (Signed)
  Subjective:    Patient ID: Erik Smith, male    DOB: 1966-02-21, 45 y.o.   MRN: [id number]  HPI CC: f/u as well as tick bite  1. Tick - noticed this morning.  Went to school, took most of it out, tried and tried to get little piece out but unable to.  Wants me to take look at it.  No HA, fever, NEW rashes, abd pain, n/v, joint pains.  Wants entire tick removed.  2. Bipolar - mood improved.  appetite up.  Feels stress tolerating better, no manic sxs.  No depressive sxs.  Saw Dr. Laymond Smith for CBT. Scheduled to see psychiatry May 9th (had to reschedule). Still in grad school (significant stress) and with some stress at home.  Not sleeping well overall.  No mania, grandiosity.  No SI/HI.  3. Skin rash on hands - noticed even before VPA.  Itchy occasionally.  Along glove distribution bilateral hands.  More irritating certain areas ie ring finger, so has removed ring.  Thought initially contact dermatitis, and treated with TCI.  Hasn't resolved rash, now scaling.  No new products, no mouth lesions.  Medications and allergies reviewed and updated as above. PMHx reviewed  Review of Systems Per HPI    Objective:   Physical Exam  Constitutional: He appears well-developed and well-nourished. No distress.  Neurological: He is alert.  Skin: Skin is warm and dry. Rash (erythematous scaly rash wrist R>L, macular, now spread to between digits especially L ring finger.  ) noted.       Head of tick remains in R upper back, removed in entirety with forceps, area cleaned with alcohol swab.  Psychiatric: His speech is normal and behavior is normal. Judgment and thought content normal. His mood appears not anxious. His affect is labile (slight). His affect is not angry, not blunt and not inappropriate. Cognition and memory are normal. He does not exhibit a depressed mood.          Assessment & Plan:

## 2010-09-07 NOTE — Assessment & Plan Note (Addendum)
Much improved on increased dose of VPA, mood more stable.  Not at therapeutic range however (85-125). Increase VPA again, recheck in 2 wks. Goal is stablility with just celexa and VPA.  Hopeful to titrate off risperdal as has noted weight gain with this med in just first 2 wks. RTC 2 wks for recheck levels titrate accordingly, 1 mo for f/u. Has psych appt 09/14/2010. (valproate 1/2 life is 9-16 hours, could do BID dosing)

## 2010-09-08 ENCOUNTER — Ambulatory Visit: Payer: BC Managed Care – PPO | Admitting: Family Medicine

## 2010-09-14 ENCOUNTER — Ambulatory Visit (INDEPENDENT_AMBULATORY_CARE_PROVIDER_SITE_OTHER): Payer: BC Managed Care – PPO | Admitting: Psychology

## 2010-09-14 DIAGNOSIS — F4323 Adjustment disorder with mixed anxiety and depressed mood: Secondary | ICD-10-CM

## 2010-09-14 DIAGNOSIS — F319 Bipolar disorder, unspecified: Secondary | ICD-10-CM

## 2010-09-19 ENCOUNTER — Other Ambulatory Visit: Payer: BC Managed Care – PPO | Admitting: Family Medicine

## 2010-09-19 DIAGNOSIS — F319 Bipolar disorder, unspecified: Secondary | ICD-10-CM

## 2010-09-20 ENCOUNTER — Telehealth: Payer: Self-pay | Admitting: Family Medicine

## 2010-09-20 ENCOUNTER — Ambulatory Visit: Payer: BC Managed Care – PPO | Admitting: Psychology

## 2010-09-20 LAB — VALPROIC ACID LEVEL: Valproic Acid Lvl: 89.8 ug/mL (ref 50.0–100.0)

## 2010-09-20 NOTE — Telephone Encounter (Signed)
Message left on cell notifying pt of results and to increase to 1250 mg twice daily. Instructed him to call with any questions.

## 2010-09-20 NOTE — Telephone Encounter (Signed)
VPA level good at 89.  Is this on 750 tid?  If so, go up to 1250mg  bid.  I have changed dose in chart.

## 2010-10-05 ENCOUNTER — Ambulatory Visit (INDEPENDENT_AMBULATORY_CARE_PROVIDER_SITE_OTHER): Payer: BC Managed Care – PPO | Admitting: Psychology

## 2010-10-05 DIAGNOSIS — F4323 Adjustment disorder with mixed anxiety and depressed mood: Secondary | ICD-10-CM

## 2010-10-05 DIAGNOSIS — F319 Bipolar disorder, unspecified: Secondary | ICD-10-CM

## 2010-10-10 ENCOUNTER — Encounter: Payer: Self-pay | Admitting: Family Medicine

## 2010-10-10 ENCOUNTER — Ambulatory Visit (INDEPENDENT_AMBULATORY_CARE_PROVIDER_SITE_OTHER): Payer: BC Managed Care – PPO | Admitting: Family Medicine

## 2010-10-10 VITALS — BP 110/72 | HR 84 | Temp 98.1°F | Wt 223.1 lb

## 2010-10-10 DIAGNOSIS — F319 Bipolar disorder, unspecified: Secondary | ICD-10-CM

## 2010-10-10 DIAGNOSIS — W57XXXA Bitten or stung by nonvenomous insect and other nonvenomous arthropods, initial encounter: Secondary | ICD-10-CM

## 2010-10-10 DIAGNOSIS — Z0000 Encounter for general adult medical examination without abnormal findings: Secondary | ICD-10-CM

## 2010-10-10 MED ORDER — PROMETHAZINE HCL 25 MG PO TABS: 25.0000 mg | ORAL_TABLET | Freq: Four times a day (QID) | ORAL | Status: AC | PRN

## 2010-10-10 NOTE — Patient Instructions (Addendum)
Return in next few months for physical. Return fasting a few days prior for blood work. Continue meds as up to now. May consider trial off risperdal (check with psych). Good to see you today.  Call us with questions.

## 2010-10-10 NOTE — Assessment & Plan Note (Addendum)
Discussed possibility of coming off risperdal to see how he does now that VPA level therapeutic (and hopeful to decrease appetite).  Pt prefers to continue meds for now, no change. To check with psych tomorrow regarding trial of risperdal. Due for CPE, return fasting a few days prior for blood work

## 2010-10-10 NOTE — Progress Notes (Signed)
  Subjective:    Patient ID: Erik Smith, male    DOB: 1965/12/12, 44 y.o.   MRN: [id number]  HPI CC: mood f/u  School ends next week.  Grad school starting same time.  Will finish master's education this summer.  Things at home ok.  Besides 25lb weight gain, thinks things doing well.  On depakote 1250mg  bid.  Feeling some GERD sxs - epigastric burning - since put on weight.  No CP/tightness, SOB.  Feels weight.  Thinks this summer will have more time for exercise, watching diet.  Planning to start walking/running.  Did run out of risperdal over weekend last month, noted deterioration off med.  H/o torn esophagus 2001, treated with nexium.    appt with psychiatry tomorrow.  Also wants me to recheck tick bite - wife worried.  Not bothering him.  CPE - unsure when last one was.  Due.  Review of Systems Per HPI    Objective:   Physical Exam  Nursing note and vitals reviewed. Constitutional: He appears well-developed and well-nourished. No distress.  HENT:  Head: Normocephalic and atraumatic.  Right Ear: External ear normal.  Left Ear: External ear normal.  Nose: Nose normal.  Mouth/Throat: Oropharynx is clear and moist. No oropharyngeal exudate.       No cerumen  Eyes: Conjunctivae and EOM are normal. Pupils are equal, round, and reactive to light. No scleral icterus.  Neck: Normal range of motion. Neck supple. No thyromegaly present.  Cardiovascular: Normal rate, regular rhythm, normal heart sounds and intact distal pulses.   No murmur heard. Pulmonary/Chest: Effort normal and breath sounds normal. No respiratory distress. He has no wheezes. He has no rales.  Lymphadenopathy:    He has no cervical adenopathy.  Skin: Skin is warm and dry. No rash noted.       R post shoulderblade with nodule where tick bite was, no break in skin, nontender, no erythema or pruritis  Psychiatric: He has a normal mood and affect. Judgment and thought content normal.          Assessment &  Plan:

## 2010-10-10 NOTE — Assessment & Plan Note (Signed)
Firm nodule remains at site of tick head removal, but pretty confident removed entire tick head last visit.   Not bothering him currently, continue to monitor.

## 2010-10-17 ENCOUNTER — Encounter: Payer: BC Managed Care – PPO | Admitting: Family Medicine

## 2010-11-02 ENCOUNTER — Ambulatory Visit (INDEPENDENT_AMBULATORY_CARE_PROVIDER_SITE_OTHER): Payer: BC Managed Care – PPO | Admitting: Psychology

## 2010-11-02 DIAGNOSIS — F4323 Adjustment disorder with mixed anxiety and depressed mood: Secondary | ICD-10-CM

## 2010-11-02 DIAGNOSIS — F319 Bipolar disorder, unspecified: Secondary | ICD-10-CM

## 2010-12-06 ENCOUNTER — Ambulatory Visit: Payer: BC Managed Care – PPO | Admitting: Psychology

## 2010-12-12 ENCOUNTER — Other Ambulatory Visit (INDEPENDENT_AMBULATORY_CARE_PROVIDER_SITE_OTHER): Payer: BC Managed Care – PPO | Admitting: Family Medicine

## 2010-12-12 DIAGNOSIS — Z0000 Encounter for general adult medical examination without abnormal findings: Secondary | ICD-10-CM

## 2010-12-12 DIAGNOSIS — F319 Bipolar disorder, unspecified: Secondary | ICD-10-CM

## 2010-12-12 LAB — COMPREHENSIVE METABOLIC PANEL
ALT: 19 U/L (ref 0–53)
AST: 22 U/L (ref 0–37)
Albumin: 4 g/dL (ref 3.5–5.2)
Alkaline Phosphatase: 48 U/L (ref 39–117)
BUN: 17 mg/dL (ref 6–23)
CO2: 26 mEq/L (ref 19–32)
Calcium: 8.6 mg/dL (ref 8.4–10.5)
Chloride: 100 mEq/L (ref 96–112)
Creatinine, Ser: 1.1 mg/dL (ref 0.4–1.5)
GFR: 78.9 mL/min (ref >60.00)
Glucose, Bld: 83 mg/dL (ref 70–99)
Potassium: 4.2 mEq/L (ref 3.5–5.1)
Sodium: 136 mEq/L (ref 135–145)
Total Bilirubin: 1 mg/dL (ref 0.3–1.2)
Total Protein: 6.4 g/dL (ref 6.0–8.3)

## 2010-12-12 LAB — CBC WITH DIFFERENTIAL/PLATELET
Basophils Absolute: 0 10*3/uL (ref 0.0–0.1)
Basophils Relative: 0.6 % (ref 0.0–3.0)
Eosinophils Absolute: 0.3 10*3/uL (ref 0.0–0.7)
Eosinophils Relative: 4.8 % (ref 0.0–5.0)
HCT: 44 % (ref 39.0–52.0)
Hemoglobin: 14.8 g/dL (ref 13.0–17.0)
Lymphocytes Relative: 38.9 % (ref 12.0–46.0)
Lymphs Abs: 2 10*3/uL (ref 0.7–4.0)
MCHC: 33.7 g/dL (ref 30.0–36.0)
MCV: 91.8 fl (ref 78.0–100.0)
Monocytes Absolute: 0.6 10*3/uL (ref 0.1–1.0)
Monocytes Relative: 10.7 % (ref 3.0–12.0)
Neutro Abs: 2.4 10*3/uL (ref 1.4–7.7)
Neutrophils Relative %: 45 % (ref 43.0–77.0)
Platelets: 174 10*3/uL (ref 150.0–400.0)
RBC: 4.79 Mil/uL (ref 4.22–5.81)
RDW: 14.2 % (ref 11.5–14.6)
WBC: 5.2 10*3/uL (ref 4.5–10.5)

## 2010-12-12 LAB — LIPID PANEL
Cholesterol: 179 mg/dL (ref 0–200)
HDL: 59 mg/dL (ref >39.00)
LDL Cholesterol: 98 mg/dL (ref 0–99)
Total CHOL/HDL Ratio: 3
Triglycerides: 108 mg/dL (ref 0.0–149.0)
VLDL: 21.6 mg/dL (ref 0.0–40.0)

## 2010-12-12 LAB — TSH: TSH: 1.03 u[IU]/mL (ref 0.35–5.50)

## 2010-12-14 ENCOUNTER — Ambulatory Visit (INDEPENDENT_AMBULATORY_CARE_PROVIDER_SITE_OTHER): Payer: BC Managed Care – PPO | Admitting: Psychology

## 2010-12-14 DIAGNOSIS — F4323 Adjustment disorder with mixed anxiety and depressed mood: Secondary | ICD-10-CM

## 2010-12-14 DIAGNOSIS — F319 Bipolar disorder, unspecified: Secondary | ICD-10-CM

## 2010-12-19 ENCOUNTER — Ambulatory Visit (INDEPENDENT_AMBULATORY_CARE_PROVIDER_SITE_OTHER): Payer: BC Managed Care – PPO | Admitting: Family Medicine

## 2010-12-19 ENCOUNTER — Encounter: Payer: Self-pay | Admitting: Family Medicine

## 2010-12-19 VITALS — BP 112/74 | HR 76 | Temp 98.0°F | Wt 227.8 lb

## 2010-12-19 DIAGNOSIS — Z0000 Encounter for general adult medical examination without abnormal findings: Secondary | ICD-10-CM | POA: Insufficient documentation

## 2010-12-19 DIAGNOSIS — F319 Bipolar disorder, unspecified: Secondary | ICD-10-CM

## 2010-12-19 MED ORDER — VALPROIC ACID 250 MG PO CAPS: ORAL_CAPSULE | ORAL | Status: DC

## 2010-12-19 NOTE — Assessment & Plan Note (Signed)
Reviewed preventative protocols. Seat belt 100%, reviewed sunscreen use. UTD tetanus. Reviewed blood work. Not due for colonoscopy, blood in stool sounds external, advised if any change to return sooner.

## 2010-12-19 NOTE — Assessment & Plan Note (Signed)
To f/u with psych 01/2011. RTC 3 mo for recheck blood work, 6 mo for f/u OV.

## 2010-12-19 NOTE — Patient Instructions (Signed)
Blood work provided today. Good to see you today. Call us with quesitons. Return in 3 months for blood work, in 6 months for office visit, sooner if needed.

## 2010-12-19 NOTE — Progress Notes (Signed)
Subjective:    Patient ID: Erik Smith, male    DOB: [birth year], 45 y.o.   MRN: [id number]  HPI CC: CPE  Bipolar - to see psych beginning of September.  Advised to stay on risperdal for now.  Doing well on 5 tabs depakote twice daily as well as celexa daily.  Only worry is weight gain.  Tries to stay active.    Here for CPE today.    Occasionally gets sore spot under arms.  H/o boil under arm, none currently.  H/o perianal abscess.  Has noted surface blood occasionally.  At times feels blisters around area.  Thinks has had hemorrhoids in past.  No BM changes.  More when wiping, not in stool or on stool.  Wt Readings from Last 3 Encounters:  12/19/10 227 lb 12 oz (103.307 kg)  10/10/10 223 lb 1.9 oz (101.207 kg)  09/07/10 214 lb 1.3 oz (97.106 kg)   Preventative: Colonoscopy - 1999 for blood in stool, normal.  No BM changes. Prostate - strong stream, no nocturia.  + fmhx prostate cancer - paternal grandfather. Tetanus 2005.  Medications and allergies reviewed and updated in chart. Patient Active Problem List  Diagnoses  . DISORDER, BIPOLAR NOS  . DEPRESSION  . COMMON MIGRAINE  . TINNITUS, CHRONIC  . ASTHMA  . NECK PAIN, CHRONIC  . Skin rash  . Tick bite   Past Medical History  Diagnosis Date  . Unspecified adjustment reaction   . History of asthma   . Migraine without aura, without mention of intractable migraine without mention of status migrainosus   . Bipolar disorder, unspecified     hospitalization 2004 for depression, bipolar, ambien overdose  . Family history of ischemic heart disease   . Cholesteatoma 05/2010    s/p R ear surgery, residual tinnitus  . Spinal stenosis   . Arthritis    Past Surgical History  Procedure Date  . Inner ear surgery [phone] and 1995    for cholesteatoma, bilateral first L then R  . Esophagus surgery 2003    torn esophagus  . Neck surgery 2002    spinal stenosis and arthritis  . Perianal abscess 1998  . Colonoscopy 1999    normal  (in )   History  Substance Use Topics  . Smoking status: Never Smoker   . Smokeless tobacco: Not on file  . Alcohol Use: Yes     1 beer/day   Family History  Problem Relation Age of Onset  . Coronary artery disease Father 20  . Prostate cancer Paternal Grandfather 35    deceased from prostate CA  . Lung cancer Paternal Grandmother 74    smoker  . Sickle cell anemia Mother   . Asthma Mother     smoker  . Breast cancer Paternal Uncle   . Stroke Neg Hx   . Diabetes Neg Hx    No Known Allergies Current Outpatient Prescriptions on File Prior to Visit  Medication Sig Dispense Refill  . citalopram (CELEXA) 10 MG tablet Take 1 tablet (10 mg total) by mouth daily.  90 tablet  1  . risperiDONE (RISPERDAL) 2 MG tablet Take 1.5 tablets (3 mg total) by mouth daily.  30 tablet  3  . clotrimazole (LOTRIMIN) 1 % cream Apply topically 2 (two) times daily. For 3 wks  30 g  0  . triamcinolone (KENALOG) 0.1 % cream Apply topically 2 (two) times daily.  30 g  0   Review of Systems  Constitutional: Positive for  unexpected weight change. Negative for fever, chills, activity change, appetite change and fatigue.  HENT: Negative for hearing loss and neck pain.   Eyes: Negative for visual disturbance.  Respiratory: Negative for cough, chest tightness, shortness of breath and wheezing.   Cardiovascular: Negative for chest pain, palpitations and leg swelling.  Gastrointestinal: Negative for nausea, vomiting, abdominal pain, diarrhea, constipation, blood in stool and abdominal distention.  Genitourinary: Negative for hematuria and difficulty urinating.  Musculoskeletal: Negative for myalgias and arthralgias.  Skin: Negative for rash.  Neurological: Negative for dizziness, seizures, syncope and headaches.  Hematological: Does not bruise/bleed easily.  Psychiatric/Behavioral: Negative for dysphoric mood. The patient is not nervous/anxious.        Objective:   Physical Exam  Nursing note  and vitals reviewed. Constitutional: He is oriented to person, place, and time. He appears well-developed and well-nourished. No distress.  HENT:  Head: Normocephalic and atraumatic.  Right Ear: External ear normal.  Left Ear: External ear normal.  Nose: Nose normal.  Mouth/Throat: Oropharynx is clear and moist.  Eyes: Conjunctivae and EOM are normal. Pupils are equal, round, and reactive to light.  Neck: Normal range of motion. Neck supple.  Cardiovascular: Normal rate, regular rhythm, normal heart sounds and intact distal pulses.   No murmur heard. Pulses:      Radial pulses are 2+ on the right side, and 2+ on the left side.  Pulmonary/Chest: Effort normal and breath sounds normal. No respiratory distress. He has no wheezes. He has no rales.  Abdominal: Soft. Bowel sounds are normal. He exhibits no distension and no mass. There is no tenderness. There is no rebound and no guarding.  Musculoskeletal: Normal range of motion. He exhibits no edema.  Lymphadenopathy:    He has no cervical adenopathy.    He has no axillary adenopathy.       Right: No supraclavicular adenopathy present.       Left: No supraclavicular adenopathy present.  Neurological: He is alert and oriented to person, place, and time.       CN grossly intact, station and gait intact  Skin: Skin is warm and dry. No rash noted.       No skin changes under arms.  Psychiatric: He has a normal mood and affect. His behavior is normal. Judgment and thought content normal.          Assessment & Plan:

## 2010-12-28 ENCOUNTER — Ambulatory Visit (INDEPENDENT_AMBULATORY_CARE_PROVIDER_SITE_OTHER): Payer: BC Managed Care – PPO | Admitting: Psychology

## 2010-12-28 DIAGNOSIS — F319 Bipolar disorder, unspecified: Secondary | ICD-10-CM

## 2010-12-28 DIAGNOSIS — F4323 Adjustment disorder with mixed anxiety and depressed mood: Secondary | ICD-10-CM

## 2011-01-06 ENCOUNTER — Other Ambulatory Visit: Payer: Self-pay | Admitting: *Deleted

## 2011-01-06 MED ORDER — RISPERIDONE 2 MG PO TABS: 3.0000 mg | ORAL_TABLET | Freq: Every day | ORAL | Status: DC

## 2011-01-06 NOTE — Telephone Encounter (Signed)
Refilled

## 2011-01-06 NOTE — Telephone Encounter (Signed)
Ok to refill 

## 2011-01-18 ENCOUNTER — Ambulatory Visit: Payer: BC Managed Care – PPO | Admitting: Psychology

## 2011-02-01 ENCOUNTER — Ambulatory Visit (INDEPENDENT_AMBULATORY_CARE_PROVIDER_SITE_OTHER): Payer: BC Managed Care – PPO | Admitting: Psychology

## 2011-02-01 DIAGNOSIS — F319 Bipolar disorder, unspecified: Secondary | ICD-10-CM

## 2011-02-01 DIAGNOSIS — F4323 Adjustment disorder with mixed anxiety and depressed mood: Secondary | ICD-10-CM

## 2011-02-22 ENCOUNTER — Other Ambulatory Visit: Payer: Self-pay | Admitting: *Deleted

## 2011-02-22 MED ORDER — PROMETHAZINE HCL 25 MG PO TABS: 25.0000 mg | ORAL_TABLET | Freq: Four times a day (QID) | ORAL | Status: DC | PRN

## 2011-02-22 NOTE — Telephone Encounter (Signed)
Ok to refill x 1  

## 2011-02-22 NOTE — Telephone Encounter (Signed)
Ok to refill? (It wasn't on current med list, so I added it.)

## 2011-02-22 NOTE — Telephone Encounter (Signed)
Rx called in as directed.   

## 2011-03-01 ENCOUNTER — Ambulatory Visit (INDEPENDENT_AMBULATORY_CARE_PROVIDER_SITE_OTHER): Payer: BC Managed Care – PPO | Admitting: Psychology

## 2011-03-01 DIAGNOSIS — F319 Bipolar disorder, unspecified: Secondary | ICD-10-CM

## 2011-03-01 DIAGNOSIS — F4323 Adjustment disorder with mixed anxiety and depressed mood: Secondary | ICD-10-CM

## 2011-03-21 ENCOUNTER — Ambulatory Visit (INDEPENDENT_AMBULATORY_CARE_PROVIDER_SITE_OTHER): Payer: BC Managed Care – PPO | Admitting: Psychology

## 2011-03-21 DIAGNOSIS — F319 Bipolar disorder, unspecified: Secondary | ICD-10-CM

## 2011-03-21 DIAGNOSIS — F4323 Adjustment disorder with mixed anxiety and depressed mood: Secondary | ICD-10-CM

## 2011-03-27 ENCOUNTER — Other Ambulatory Visit: Payer: Self-pay | Admitting: *Deleted

## 2011-03-27 MED ORDER — CITALOPRAM HYDROBROMIDE 10 MG PO TABS: 10.0000 mg | ORAL_TABLET | Freq: Every day | ORAL | Status: DC

## 2011-03-29 ENCOUNTER — Other Ambulatory Visit: Payer: Self-pay | Admitting: *Deleted

## 2011-03-29 MED ORDER — PROMETHAZINE HCL 25 MG PO TABS: 25.0000 mg | ORAL_TABLET | Freq: Four times a day (QID) | ORAL | Status: DC | PRN

## 2011-03-29 NOTE — Telephone Encounter (Signed)
Ok. Sent in

## 2011-03-29 NOTE — Telephone Encounter (Signed)
Ok to refill 

## 2011-04-19 ENCOUNTER — Ambulatory Visit (INDEPENDENT_AMBULATORY_CARE_PROVIDER_SITE_OTHER): Payer: BC Managed Care – PPO | Admitting: Psychology

## 2011-04-19 DIAGNOSIS — F4323 Adjustment disorder with mixed anxiety and depressed mood: Secondary | ICD-10-CM

## 2011-04-19 DIAGNOSIS — F319 Bipolar disorder, unspecified: Secondary | ICD-10-CM

## 2011-05-30 ENCOUNTER — Ambulatory Visit: Payer: BC Managed Care – PPO | Admitting: Psychology

## 2012-02-12 ENCOUNTER — Ambulatory Visit (INDEPENDENT_AMBULATORY_CARE_PROVIDER_SITE_OTHER): Payer: BC Managed Care – PPO | Admitting: Family Medicine

## 2012-02-12 ENCOUNTER — Encounter: Payer: Self-pay | Admitting: Family Medicine

## 2012-02-12 VITALS — BP 108/76 | HR 84 | Temp 98.1°F | Wt 213.0 lb

## 2012-02-12 DIAGNOSIS — R21 Rash and other nonspecific skin eruption: Secondary | ICD-10-CM

## 2012-02-12 DIAGNOSIS — R197 Diarrhea, unspecified: Secondary | ICD-10-CM | POA: Insufficient documentation

## 2012-02-12 MED ORDER — CIPROFLOXACIN HCL 500 MG PO TABS: 500.0000 mg | ORAL_TABLET | Freq: Two times a day (BID) | ORAL | Status: DC

## 2012-02-12 MED ORDER — CLOTRIMAZOLE 1 % EX CREA: TOPICAL_CREAM | Freq: Two times a day (BID) | CUTANEOUS | Status: DC

## 2012-02-12 NOTE — Assessment & Plan Note (Signed)
Anticipate infectious enteritis. Going on 11+ days - will treat with cipro x 3 days. Recent exposure to possibly raw chicken - sent stool culture today. Discussed importance of hydration.

## 2012-02-12 NOTE — Patient Instructions (Addendum)
I wonder if spots are ringworm - treat with clotrimazole (lotrimin) cream twice daly for 2-4 weeks. For diarrhea - we will send you to lab for stool cultures.  After you return specimen, may start antibiotic.  Let us know if not resolved with this. Good to see you today, call us with questions.

## 2012-02-12 NOTE — Progress Notes (Signed)
  Subjective:    Patient ID: Erik Smith, male    DOB: 21-Dec-1965, 46 y.o.   MRN: [id number]  HPI CC: diarrhea  H/o bipolar - off all meds.  Feels doing well with this.  Started running more. Wt Readings from Last 3 Encounters:  02/12/12 213 lb (96.616 kg)  12/19/10 227 lb 12 oz (103.307 kg)  10/10/10 223 lb 1.9 oz (101.207 kg)    Diarrhea started 11d ago.  Went to [employer] last weekend, wonders if started after bad chik fil a sandwich - chills and in bed all night.  Fever finally broke, but diarrhea has continued.    Using immodium, not helping. Has had diarrhea x2 today, loose and water.  Appetite slowly improving.  drinking more fluids.  Mild abd pain.  No more fevers/chills, no clay colored stool.  This past weekend noticed blood in stool - bright red with wiping.  Bottom doesn't feel irritated.  Uses city water and denies recent abx use.  Noticing red blotches at armpits last few months.  Not itchy or irritating  Past Medical History  Diagnosis Date  . Unspecified adjustment reaction   . History of asthma   . Migraine without aura, without mention of intractable migraine without mention of status migrainosus   . Bipolar disorder, unspecified     hospitalization 2004 for depression, bipolar, ambien overdose  . Family history of ischemic heart disease   . Cholesteatoma 05/2010    s/p R ear surgery, residual tinnitus, planning on L ear surgery  . Spinal stenosis   . Arthritis      Review of Systems Per HPI    Objective:   Physical Exam  Nursing note and vitals reviewed. Constitutional: He appears well-developed and well-nourished. No distress.  HENT:  Head: Normocephalic and atraumatic.  Mouth/Throat: Oropharynx is clear and moist. No oropharyngeal exudate.  Eyes: Conjunctivae normal and EOM are normal. Pupils are equal, round, and reactive to light. No scleral icterus.  Neck: Normal range of motion. Neck supple.  Cardiovascular: Normal rate, regular rhythm,  normal heart sounds and intact distal pulses.   No murmur heard. Pulmonary/Chest: Effort normal and breath sounds normal. No respiratory distress. He has no wheezes. He has no rales.  Abdominal: Soft. Bowel sounds are normal. He exhibits no distension and no mass. There is no hepatosplenomegaly. There is tenderness (mild to deep palpation) in the left lower quadrant. There is no rebound, no guarding and no CVA tenderness.  Musculoskeletal: He exhibits no edema.  Skin: Skin is warm and dry. No rash noted.       bilat axillae with annular erythematous patches with some central clearing, nonpruritic  Psychiatric: He has a normal mood and affect.       Assessment & Plan:

## 2012-02-12 NOTE — Assessment & Plan Note (Signed)
?  tinea corporis - treat with clotrimazole cream bid x 3-4 wks.  Update if not better.  Consider pityriasis rosea.

## 2012-02-19 ENCOUNTER — Telehealth: Payer: Self-pay

## 2012-02-19 NOTE — Telephone Encounter (Signed)
Erik Smith with [facility] said final stool report was campylobacter species positive. [name] will notify Dr Reece Agar.

## 2012-02-19 NOTE — Telephone Encounter (Signed)
Noted.  plz notify pt - stool returned positive for campylobacter.  abx should cover infection, to notify us if not improving despite treatment. plz notify health dept.

## 2012-02-20 ENCOUNTER — Telehealth: Payer: Self-pay | Admitting: Radiology

## 2012-02-20 ENCOUNTER — Telehealth: Payer: Self-pay

## 2012-02-20 NOTE — Telephone Encounter (Signed)
Noted. See prior phone note. 

## 2012-02-20 NOTE — Telephone Encounter (Signed)
Erik Smith with [employer] health dept sent request for additional info. Received and given to [employer]. Erik Smith notified while on phone did receive request.

## 2012-02-20 NOTE — Telephone Encounter (Signed)
Message left notifying patient that stool culture came back positive for bacteria, but that abx should take care of it. Advised to call if no better after treatment. Health Dept. Notified.

## 2012-02-20 NOTE — Telephone Encounter (Signed)
Additional info sent as requested.

## 2012-02-20 NOTE — Telephone Encounter (Signed)
Pt left v/m returning Kims call. Call pt [phone].

## 2012-02-20 NOTE — Telephone Encounter (Signed)
[facility] called critical stool culture - positive Campylobacter species, results given to Dr Sharen Hones

## 2012-02-21 NOTE — Telephone Encounter (Signed)
Spoke with patient. He just wanted to know about why health department would be calling him, but he had already spoken with them.

## 2012-02-26 LAB — STOOL CULTURE

## 2012-10-17 ENCOUNTER — Telehealth: Payer: Self-pay | Admitting: Family Medicine

## 2012-10-17 ENCOUNTER — Other Ambulatory Visit: Payer: Self-pay | Admitting: Family Medicine

## 2012-10-17 ENCOUNTER — Ambulatory Visit
Admission: RE | Admit: 2012-10-17 | Discharge: 2012-10-17 | Disposition: A | Discharge status: Home / Self Care | Payer: Worker's Compensation | Source: Ambulatory Visit | Attending: Family Medicine | Admitting: Family Medicine

## 2012-10-17 DIAGNOSIS — T1490XA Injury, unspecified, initial encounter: Secondary | ICD-10-CM

## 2012-10-17 NOTE — Telephone Encounter (Signed)
Patient Information:  Caller Name: [place]  Phone: [phone]  Patient: Shanta, Dorvil  Gender: Male  DOB: 11-30-1965  Age: 47 Years  PCP: [name] [facility])  Office Follow Up:  Does the office need to follow up with this patient?: No  Instructions For The Office: N/A  RN Note:  Before RN called back for triage, Augusten was told by school staff that worker's comp case needs to be seen at [employer] in [place] so he declined triage. Agreed to call back if needs assistance.  Symptoms  Reason For Call & Symptoms: Injured right knee while playing tug of war with 3rd grade class at school. Felt snap followed by knee pain.  Reviewed Health History In EMR: N/A  Reviewed Medications In EMR: N/A  Reviewed Allergies In EMR: N/A  Reviewed Surgeries / Procedures: N/A  Date of Onset of Symptoms: 10/17/2012  Treatments Tried: ice pack  Treatments Tried Worked: No  Guideline(s) Used:  No Protocol Available - Information Only  Disposition Per Guideline:   Home Care  Reason For Disposition Reached:   Information only question and nurse able to answer  Advice Given:  N/A  RN Overrode Recommendation:  Document Patient  [place]an is seeking care from Workers Comp office per work Tour manager.

## 2012-12-09 ENCOUNTER — Telehealth: Payer: Self-pay

## 2012-12-09 NOTE — Telephone Encounter (Signed)
Noted  Will discuss then

## 2012-12-09 NOTE — Telephone Encounter (Signed)
Since starting Risperdal and Depakene pt has had issues with upset stomach; pt has tried taking with different foods but no improvement; pt stopped meds end of May. Pt scheduled appt with Dr Reece Agar on 12/10/12 at 9:15 am. Pt said he will be OK to wait until 12/10/12; wants to get started on new med prior to school starting back. [place] [facility].

## 2012-12-10 ENCOUNTER — Encounter: Payer: Self-pay | Admitting: Family Medicine

## 2012-12-10 ENCOUNTER — Ambulatory Visit (INDEPENDENT_AMBULATORY_CARE_PROVIDER_SITE_OTHER): Payer: BC Managed Care – PPO | Admitting: Family Medicine

## 2012-12-10 VITALS — BP 118/82 | HR 68 | Temp 98.3°F | Wt 212.5 lb

## 2012-12-10 DIAGNOSIS — I499 Cardiac arrhythmia, unspecified: Secondary | ICD-10-CM

## 2012-12-10 DIAGNOSIS — F319 Bipolar disorder, unspecified: Secondary | ICD-10-CM

## 2012-12-10 DIAGNOSIS — M79609 Pain in unspecified limb: Secondary | ICD-10-CM

## 2012-12-10 DIAGNOSIS — M79629 Pain in unspecified upper arm: Secondary | ICD-10-CM

## 2012-12-10 MED ORDER — LITHIUM CARBONATE ER 300 MG PO TBCR: 300.0000 mg | EXTENDED_RELEASE_TABLET | Freq: Two times a day (BID) | ORAL | Status: DC

## 2012-12-10 NOTE — Assessment & Plan Note (Signed)
Unclear etiology.  Exam WNL today. Will continue to monitor.

## 2012-12-10 NOTE — Assessment & Plan Note (Addendum)
Start lithium ER 300mg  bid - discussed precautions. rtc 10 d for lithium level and other blood work. Check EKG today given irreg heart beat.  EKG - NSR rate 75, normal axis, intervals, no acute ST/T changes.  1 PVC. ?p mitrale

## 2012-12-10 NOTE — Patient Instructions (Addendum)
EKG today. Start lithium extended release twice daily. Return in 10 days first thing in am for blood draw.  We will call you with plan after that. (blood work prior to lithium dose that morning)  Lithium extended-release tablets What is this medicine? LITHIUM (LITH ee um) is used to prevent and treat the manic episodes caused by manic-depressive illness. This medicine may be used for other purposes; ask your health care provider or pharmacist if you have questions. What should I tell my health care provider before I take this medicine? They need to know if you have any of these conditions: -Brugada Syndrome -dehydration (diarrhea or sweating) -heart or blood vessel disease -kidney disease -low level of salt in the blood, or low salt diet -an unusual or allergic reaction to lithium, other medicines, foods, dyes, or preservatives -pregnant or trying to get pregnant -breast-feeding How should I use this medicine? Take this medicine by mouth with a glass of water. Follow the directions on the prescription label. Swallow the tablets whole. Do not break, crush or chew. Take after a meal or snack to avoid stomach upset. Take your doses at regular intervals. Do not take your medicine more often than directed. The amount of this medicine you take is very important. Taking more than the prescribed dose can cause serious side effects. Do not stop taking except on your the advice of your doctor or health care professional. Talk to your pediatrician regarding the use of this medicine in children. Special care may be needed. While this drug may be prescribed for children as young as 12 years for selected conditions, precautions do apply. Overdosage: If you think you have taken too much of this medicine contact a poison control center or emergency room at once. NOTE: This medicine is only for you. Do not share this medicine with others. What if I miss a dose? If you miss a dose, take it as soon as you can.  If it is almost time for your next dose, take only that dose. Do not take double or extra doses. What may interact with this medicine? Do not take this medicine with any of the following medications: -stimulant medicines used to treat ADHD or narcolepsy This medicine may also interact with the following medications: -caffeine -calcium iodide -carbamazepine -diuretics -medicines for high blood pressure -medicines for mental problems and psychotic disturbances -metronidazole -NSAIDs, medicines for pain and inflammation, like ibuprofen or naproxen -phenytoin -potassium iodide, KI -sodium bicarbonate -sodium chloride -urea This list may not describe all possible interactions. Give your health care provider a list of all the medicines, herbs, non-prescription drugs, or dietary supplements you use. Also tell them if you smoke, drink alcohol, or use illegal drugs. Some items may interact with your medicine. What should I watch for while using this medicine? Visit your doctor or health care professional for regular checks on your progress. It can take several weeks of treatment before you start to get better. The amount of salt (sodium) in your body influences the effects of this medicine, and this medicine can increase salt loss from the body. Eat a normal diet that includes salt. Do not change to salt substitutes. Avoid changes involving diet, or medications that include large amounts of sodium like sodium bicarbonate. Ask your doctor or health care professional for advice if you are not sure. Drink plenty of fluids while you are taking this medicine. Avoid drinks that contain caffeine, such as coffee, tea and colas. You will need extra fluids if you  have diarrhea or sweat a lot. This will help prevent toxic effects from this medicine. Be careful not to get overheated during exercise, saunas, hot baths, and hot weather. Consult your doctor or health care professional if you have a high fever or  persistent diarrhea. You may get drowsy or dizzy. Do not drive, use machinery, or do anything that needs mental alertness until you know how this medicine affects you. Do not stand or sit up quickly, especially if you are an older patient. This reduces the risk of dizzy or fainting spells. What side effects may I notice from receiving this medicine? Side effects that you should report to your doctor or health care professional as soon as possible: -allergic reactions like skin rash, itching or hives, swelling of the face, lips, or tongue -blurred vision -breathing problems -clumsiness or loss of balance -confusion -difficulty speaking or swallowing -dizziness -feeling faint or lightheaded, falls -increased thirst -increased urination -loss of appetite -muscle weakness -nausea, vomiting -pain, coldness, or blue coloration of fingers or toes -sensitivity to cold -seizures -slow, fast, or irregular heartbeat (palpitations) -slurred speech -swelling in the neck -unusually weak or tired Side effects that usually do not require medical attention (report to your doctor or health care professional if they continue or are bothersome): -acne -diarrhea -mild tremor -stomach pain -weight gain This list may not describe all possible side effects. Call your doctor for medical advice about side effects. You may report side effects to FDA at 1-800-FDA-1088. Where should I keep my medicine? Keep out of the reach of children. Store at room temperature between 15 and 30 degrees C (59 and 86 degrees F). Keep container tightly closed. Protect from light. Throw away any unused medicine after the expiration date. NOTE: This sheet is a summary. It may not cover all possible information. If you have questions about this medicine, talk to your doctor, pharmacist, or health care provider.  2012, [employer]vier/[employer] Standard. (05/27/2010 3:18:53 PM)

## 2012-12-10 NOTE — Progress Notes (Signed)
  Subjective:    Patient ID: Erik Smith, male    DOB: 1965-11-28, 47 y.o.   MRN: [id number]  HPI CC: meds   pleasant 47 yo with h/o bipolar presents today to discuss med concerns. Prior on depakote and risperdal and celexa 10mg  daily, but concerned about weight gain on risperdal and depakote.  Had gained weight and was on nausea, GI upset.  Stopped all meds cold [place] several months ago. Has been on depakote longterm. Over last 2 weeks increased irritability, short temper, lashing out more. School starts 12/30/2012, wants to be on med for then. Would like to discuss starting new bipolar medication. Not interested    Hand rash - TCI cream helps but persists.  Moisturizing daily.  Sensitive in axillary region intermittently over last few years.  No muscle pain.  Cousin with h/o leukemia.  Wt Readings from Last 3 Encounters:  12/10/12 212 lb 8 oz (96.389 kg)  02/12/12 213 lb (96.616 kg)  12/19/10 227 lb 12 oz (103.307 kg)   Body mass index is 30.29 kg/(m^2).  Past Medical History  Diagnosis Date  . Unspecified adjustment reaction   . History of asthma   . Migraine without aura, without mention of intractable migraine without mention of status migrainosus   . Bipolar disorder, unspecified     hospitalization 2004 for depression, bipolar, ambien overdose  . Family history of ischemic heart disease   . Cholesteatoma 05/2010    s/p R ear surgery, residual tinnitus, planning on L ear surgery  . Spinal stenosis   . Arthritis     Review of Systems Per HPI    Objective:   Physical Exam  Nursing note and vitals reviewed. Constitutional: He appears well-developed and well-nourished. No distress.  HENT:  Head: Normocephalic and atraumatic.  Mouth/Throat: Oropharynx is clear and moist. No oropharyngeal exudate.  Neck: Normal range of motion. Neck supple. No thyromegaly present.  Cardiovascular: Normal rate, normal heart sounds and intact distal pulses.  An irregular rhythm present.  No  murmur heard. Intermittent skipped beats  Pulmonary/Chest: Effort normal and breath sounds normal. No respiratory distress. He has no wheezes. He has no rales.  Musculoskeletal: He exhibits no edema.  No axillary LAD  No axillary skin changes  Psychiatric: He has a normal mood and affect. His behavior is normal. Judgment and thought content normal.       Assessment & Plan:

## 2012-12-20 ENCOUNTER — Other Ambulatory Visit (INDEPENDENT_AMBULATORY_CARE_PROVIDER_SITE_OTHER): Payer: BC Managed Care – PPO

## 2012-12-20 DIAGNOSIS — F319 Bipolar disorder, unspecified: Secondary | ICD-10-CM

## 2012-12-20 LAB — COMPREHENSIVE METABOLIC PANEL
ALT: 48 U/L (ref 0–53)
AST: 32 U/L (ref 0–37)
Albumin: 4.3 g/dL (ref 3.5–5.2)
Alkaline Phosphatase: 91 U/L (ref 39–117)
BUN: 12 mg/dL (ref 6–23)
CO2: 29 mEq/L (ref 19–32)
Calcium: 9.6 mg/dL (ref 8.4–10.5)
Chloride: 102 mEq/L (ref 96–112)
Creatinine, Ser: 1 mg/dL (ref 0.4–1.5)
GFR: 82.66 mL/min (ref >60.00)
Glucose, Bld: 83 mg/dL (ref 70–99)
Potassium: 4.2 mEq/L (ref 3.5–5.1)
Sodium: 137 mEq/L (ref 135–145)
Total Bilirubin: 0.9 mg/dL (ref 0.3–1.2)
Total Protein: 7.2 g/dL (ref 6.0–8.3)

## 2012-12-20 LAB — CBC WITH DIFFERENTIAL/PLATELET
Basophils Absolute: 0 10*3/uL (ref 0.0–0.1)
Basophils Relative: 0.5 % (ref 0.0–3.0)
Eosinophils Absolute: 0.3 10*3/uL (ref 0.0–0.7)
Eosinophils Relative: 3.4 % (ref 0.0–5.0)
HCT: 46.5 % (ref 39.0–52.0)
Hemoglobin: 15.9 g/dL (ref 13.0–17.0)
Lymphocytes Relative: 25 % (ref 12.0–46.0)
Lymphs Abs: 2.2 10*3/uL (ref 0.7–4.0)
MCHC: 34.3 g/dL (ref 30.0–36.0)
MCV: 87 fl (ref 78.0–100.0)
Monocytes Absolute: 0.7 10*3/uL (ref 0.1–1.0)
Monocytes Relative: 7.3 % (ref 3.0–12.0)
Neutro Abs: 5.7 10*3/uL (ref 1.4–7.7)
Neutrophils Relative %: 63.8 % (ref 43.0–77.0)
Platelets: 250 10*3/uL (ref 150.0–400.0)
RBC: 5.34 Mil/uL (ref 4.22–5.81)
RDW: 13.2 % (ref 11.5–14.6)
WBC: 8.9 10*3/uL (ref 4.5–10.5)

## 2012-12-20 LAB — TSH: TSH: 1.75 u[IU]/mL (ref 0.35–5.50)

## 2012-12-21 LAB — LITHIUM LEVEL: Lithium Lvl: 0.3 mEq/L — ABNORMAL LOW (ref 0.80–1.40)

## 2012-12-23 ENCOUNTER — Other Ambulatory Visit: Payer: Self-pay | Admitting: Family Medicine

## 2012-12-23 DIAGNOSIS — F319 Bipolar disorder, unspecified: Secondary | ICD-10-CM

## 2012-12-23 MED ORDER — LITHIUM CARBONATE ER 450 MG PO TBCR: 450.0000 mg | EXTENDED_RELEASE_TABLET | Freq: Two times a day (BID) | ORAL | Status: DC

## 2012-12-23 MED ORDER — LITHIUM CARBONATE ER 300 MG PO TBCR: 600.0000 mg | EXTENDED_RELEASE_TABLET | Freq: Two times a day (BID) | ORAL | Status: DC

## 2013-01-09 ENCOUNTER — Other Ambulatory Visit (INDEPENDENT_AMBULATORY_CARE_PROVIDER_SITE_OTHER): Payer: BC Managed Care – PPO

## 2013-01-09 DIAGNOSIS — F319 Bipolar disorder, unspecified: Secondary | ICD-10-CM

## 2013-01-09 DIAGNOSIS — Z0000 Encounter for general adult medical examination without abnormal findings: Secondary | ICD-10-CM

## 2013-01-09 LAB — CBC WITH DIFFERENTIAL/PLATELET
Basophils Absolute: 0.1 10*3/uL (ref 0.0–0.1)
Basophils Relative: 0.7 % (ref 0.0–3.0)
Eosinophils Absolute: 0.2 10*3/uL (ref 0.0–0.7)
Eosinophils Relative: 2.7 % (ref 0.0–5.0)
HCT: 46.4 % (ref 39.0–52.0)
Hemoglobin: 15.6 g/dL (ref 13.0–17.0)
Lymphocytes Relative: 21.7 % (ref 12.0–46.0)
Lymphs Abs: 1.9 10*3/uL (ref 0.7–4.0)
MCHC: 33.5 g/dL (ref 30.0–36.0)
MCV: 88.6 fl (ref 78.0–100.0)
Monocytes Absolute: 0.8 10*3/uL (ref 0.1–1.0)
Monocytes Relative: 9 % (ref 3.0–12.0)
Neutro Abs: 5.8 10*3/uL (ref 1.4–7.7)
Neutrophils Relative %: 65.9 % (ref 43.0–77.0)
Platelets: 273 10*3/uL (ref 150.0–400.0)
RBC: 5.24 Mil/uL (ref 4.22–5.81)
RDW: 13.4 % (ref 11.5–14.6)
WBC: 8.8 10*3/uL (ref 4.5–10.5)

## 2013-01-09 LAB — BASIC METABOLIC PANEL
BUN: 12 mg/dL (ref 6–23)
CO2: 30 mEq/L (ref 19–32)
Calcium: 9.5 mg/dL (ref 8.4–10.5)
Chloride: 103 mEq/L (ref 96–112)
Creatinine, Ser: 1 mg/dL (ref 0.4–1.5)
GFR: 80.81 mL/min (ref >60.00)
Glucose, Bld: 91 mg/dL (ref 70–99)
Potassium: 4.5 mEq/L (ref 3.5–5.1)
Sodium: 137 mEq/L (ref 135–145)

## 2013-01-09 LAB — TSH: TSH: 1.4 u[IU]/mL (ref 0.35–5.50)

## 2013-01-10 ENCOUNTER — Encounter: Payer: Self-pay | Admitting: *Deleted

## 2013-01-10 LAB — LITHIUM LEVEL: Lithium Lvl: 0.4 mEq/L — ABNORMAL LOW (ref 0.80–1.40)

## 2013-01-15 ENCOUNTER — Other Ambulatory Visit: Payer: Self-pay | Admitting: Family Medicine

## 2013-01-15 DIAGNOSIS — F319 Bipolar disorder, unspecified: Secondary | ICD-10-CM

## 2013-01-15 MED ORDER — LITHIUM CARBONATE ER 300 MG PO TBCR: 600.0000 mg | EXTENDED_RELEASE_TABLET | Freq: Two times a day (BID) | ORAL | Status: DC

## 2013-01-23 ENCOUNTER — Other Ambulatory Visit (INDEPENDENT_AMBULATORY_CARE_PROVIDER_SITE_OTHER): Payer: BC Managed Care – PPO

## 2013-01-23 DIAGNOSIS — F319 Bipolar disorder, unspecified: Secondary | ICD-10-CM

## 2013-01-24 ENCOUNTER — Encounter: Payer: Self-pay | Admitting: *Deleted

## 2013-01-24 LAB — LITHIUM LEVEL: Lithium Lvl: 0.6 mEq/L — ABNORMAL LOW (ref 0.80–1.40)

## 2013-04-16 ENCOUNTER — Other Ambulatory Visit: Payer: Self-pay | Admitting: Family Medicine

## 2013-04-16 DIAGNOSIS — F319 Bipolar disorder, unspecified: Secondary | ICD-10-CM

## 2013-04-24 ENCOUNTER — Other Ambulatory Visit: Payer: BC Managed Care – PPO

## 2013-04-29 ENCOUNTER — Encounter: Payer: Self-pay | Admitting: Family Medicine

## 2013-04-29 ENCOUNTER — Ambulatory Visit (INDEPENDENT_AMBULATORY_CARE_PROVIDER_SITE_OTHER): Payer: BC Managed Care – PPO | Admitting: Family Medicine

## 2013-04-29 VITALS — BP 110/74 | HR 76 | Temp 98.5°F | Wt 222.0 lb

## 2013-04-29 DIAGNOSIS — F319 Bipolar disorder, unspecified: Secondary | ICD-10-CM

## 2013-04-29 DIAGNOSIS — Z23 Encounter for immunization: Secondary | ICD-10-CM

## 2013-04-29 NOTE — Assessment & Plan Note (Signed)
Mood stable on lithium ER 600mg  BID. Noticing some GI side effects but manageable. Check Lithium level today and update pt with results. No changes today. rtc 6 mo for annual exam.

## 2013-04-29 NOTE — Addendum Note (Signed)
Addended by: Baldomero Lamy on: 04/29/2013 01:31 PM   Modules accepted: Orders

## 2013-04-29 NOTE — Patient Instructions (Signed)
Flu shot today. Come in fasting for blood work and afterwards for physical next summer. Good to see you today, call us with questions.

## 2013-04-29 NOTE — Addendum Note (Signed)
Addended by: Josph Macho A on: 04/29/2013 10:24 AM   Modules accepted: Orders

## 2013-04-29 NOTE — Progress Notes (Signed)
   Subjective:    Patient ID: Erik Smith, male    DOB: [birth year], 47 y.o.   MRN: [id number]  HPI CC: f/u bipolar  Pleasant 47 yo with h/o bipolar presents today for routine f/u.  Prior on depakote and risperdal and celexa 10mg  daily, but concerned about weight gain on risperdal and depakote.  Started on lithium 12/2012 (taking 600mg  ER BID).  Has noticed side effect of soft stool, mild nausea, worse smelling stool, stays hungry.  No blood in stool.  Overall mood is stable.  No recent asthma issues. Flu shot today.  Wt Readings from Last 3 Encounters:  04/29/13 222 lb (100.699 kg)  12/10/12 212 lb 8 oz (96.389 kg)  02/12/12 213 lb (96.616 kg)  Body mass index is 31.64 kg/(m^2).   Past Medical History  Diagnosis Date  . Unspecified adjustment reaction   . History of asthma   . Migraine without aura, without mention of intractable migraine without mention of status migrainosus   . Bipolar disorder, unspecified     hospitalization 2004 for depression, bipolar, ambien overdose  . Family history of ischemic heart disease   . Cholesteatoma 05/2010    s/p R ear surgery, residual tinnitus, planning on L ear surgery  . Spinal stenosis   . Arthritis     Past Surgical History  Procedure Laterality Date  . Inner ear surgery  [phone] and 1995    for cholesteatoma, bilateral first L then R  . Esophagus surgery  2003    torn esophagus  . Neck surgery  2002    spinal stenosis and arthritis  . Perianal abscess  1998  . Colonoscopy  1999    normal (in [place])    Review of Systems Per HPI    Objective:   Physical Exam  Nursing note and vitals reviewed. Constitutional: He appears well-developed and well-nourished. No distress.  HENT:  Mouth/Throat: No oropharyngeal exudate.  Oropharyngeal cobblestoning  Neck: Normal range of motion. Neck supple.  Cardiovascular: Normal rate, regular rhythm, normal heart sounds and intact distal pulses.   No murmur heard. Pulmonary/Chest:  Effort normal and breath sounds normal. No respiratory distress. He has no wheezes. He has no rales.  Musculoskeletal: He exhibits no edema.  Skin: Skin is warm and dry. No rash noted.       Assessment & Plan:

## 2013-04-29 NOTE — Progress Notes (Signed)
Pre-visit discussion using our clinic review tool. No additional management support is needed unless otherwise documented below in the visit note.  

## 2013-04-30 LAB — LITHIUM LEVEL: Lithium Lvl: 0.5 mEq/L — ABNORMAL LOW (ref 0.80–1.40)

## 2013-10-22 ENCOUNTER — Other Ambulatory Visit: Payer: Self-pay | Admitting: Family Medicine

## 2013-10-22 ENCOUNTER — Other Ambulatory Visit (INDEPENDENT_AMBULATORY_CARE_PROVIDER_SITE_OTHER): Payer: BC Managed Care – PPO

## 2013-10-22 DIAGNOSIS — F319 Bipolar disorder, unspecified: Secondary | ICD-10-CM

## 2013-10-22 DIAGNOSIS — E669 Obesity, unspecified: Secondary | ICD-10-CM

## 2013-10-22 DIAGNOSIS — Z0000 Encounter for general adult medical examination without abnormal findings: Secondary | ICD-10-CM

## 2013-10-22 LAB — COMPREHENSIVE METABOLIC PANEL
ALT: 32 U/L (ref 0–53)
AST: 27 U/L (ref 0–37)
Albumin: 4.3 g/dL (ref 3.5–5.2)
Alkaline Phosphatase: 83 U/L (ref 39–117)
BUN: 9 mg/dL (ref 6–23)
CO2: 28 mEq/L (ref 19–32)
Calcium: 9.5 mg/dL (ref 8.4–10.5)
Chloride: 107 mEq/L (ref 96–112)
Creatinine, Ser: 0.9 mg/dL (ref 0.4–1.5)
GFR: 92.79 mL/min (ref >60.00)
Glucose, Bld: 101 mg/dL — ABNORMAL HIGH (ref 70–99)
Potassium: 4.6 mEq/L (ref 3.5–5.1)
Sodium: 140 mEq/L (ref 135–145)
Total Bilirubin: 0.6 mg/dL (ref 0.2–1.2)
Total Protein: 6.6 g/dL (ref 6.0–8.3)

## 2013-10-22 LAB — CBC WITH DIFFERENTIAL/PLATELET
Basophils Absolute: 0 10*3/uL (ref 0.0–0.1)
Basophils Relative: 0.5 % (ref 0.0–3.0)
Eosinophils Absolute: 0.3 10*3/uL (ref 0.0–0.7)
Eosinophils Relative: 3.6 % (ref 0.0–5.0)
HCT: 46.4 % (ref 39.0–52.0)
Hemoglobin: 15.7 g/dL (ref 13.0–17.0)
Lymphocytes Relative: 27 % (ref 12.0–46.0)
Lymphs Abs: 1.9 10*3/uL (ref 0.7–4.0)
MCHC: 33.8 g/dL (ref 30.0–36.0)
MCV: 86.8 fl (ref 78.0–100.0)
Monocytes Absolute: 0.8 10*3/uL (ref 0.1–1.0)
Monocytes Relative: 10.6 % (ref 3.0–12.0)
Neutro Abs: 4.2 10*3/uL (ref 1.4–7.7)
Neutrophils Relative %: 58.3 % (ref 43.0–77.0)
Platelets: 260 10*3/uL (ref 150.0–400.0)
RBC: 5.34 Mil/uL (ref 4.22–5.81)
RDW: 13.7 % (ref 11.5–15.5)
WBC: 7.2 10*3/uL (ref 4.0–10.5)

## 2013-10-22 LAB — LIPID PANEL
Cholesterol: 180 mg/dL (ref 0–200)
HDL: 58.3 mg/dL (ref >39.00)
LDL Cholesterol: 101 mg/dL — ABNORMAL HIGH (ref 0–99)
NonHDL: 121.7
Total CHOL/HDL Ratio: 3
Triglycerides: 102 mg/dL (ref 0.0–149.0)
VLDL: 20.4 mg/dL (ref 0.0–40.0)

## 2013-10-22 LAB — TSH: TSH: 1.64 u[IU]/mL (ref 0.35–4.50)

## 2013-10-23 LAB — LITHIUM LEVEL: Lithium Lvl: 0.6 mEq/L — ABNORMAL LOW (ref 0.80–1.40)

## 2013-10-29 ENCOUNTER — Encounter: Payer: Self-pay | Admitting: Family Medicine

## 2013-10-29 ENCOUNTER — Ambulatory Visit (INDEPENDENT_AMBULATORY_CARE_PROVIDER_SITE_OTHER): Payer: BC Managed Care – PPO | Admitting: Family Medicine

## 2013-10-29 VITALS — BP 110/72 | HR 66 | Temp 98.2°F | Ht 70.0 in | Wt 225.5 lb

## 2013-10-29 DIAGNOSIS — Z0000 Encounter for general adult medical examination without abnormal findings: Secondary | ICD-10-CM

## 2013-10-29 DIAGNOSIS — M25512 Pain in left shoulder: Secondary | ICD-10-CM | POA: Insufficient documentation

## 2013-10-29 DIAGNOSIS — F319 Bipolar disorder, unspecified: Secondary | ICD-10-CM

## 2013-10-29 DIAGNOSIS — M25519 Pain in unspecified shoulder: Secondary | ICD-10-CM

## 2013-10-29 MED ORDER — NAPROXEN 500 MG PO TABS: ORAL_TABLET | ORAL | Status: DC

## 2013-10-29 NOTE — Assessment & Plan Note (Signed)
Anticipate L subacromial bursitis as well as RTC tendinopathy. Treat with naprosyn, rest, ice, and exercises from SM pt advisor provided today. Update if not better to consider steroid shot.

## 2013-10-29 NOTE — Progress Notes (Signed)
Pre visit review using our clinic review tool, if applicable. No additional management support is needed unless otherwise documented below in the visit note. 

## 2013-10-29 NOTE — Progress Notes (Signed)
BP 110/72  Pulse 66  Temp(Src) 98.2 F (36.8 C) (Oral)  Ht 5\' 10"  (1.778 m)  Wt 225 lb 8 oz (102.286 kg)  BMI 32.36 kg/m2  SpO2 97%   CC: CPE  Subjective:    Patient ID: Erik Smith, male    DOB: February 16, 1966, 48 y.o.   MRN: [id number]  HPI: Erik Smith is a 48 y.o. male presenting on 10/29/2013 for Annual Exam   Bipolar - when takes lithium consistently causes some diarrhea. Occasionally skips a dose. L shoulder pain. Worried about torn RTC. Certain position causes sharp shoulder pain. Ongoing for last 6 weeks. Self treated with aleve. Denies inciting trauma/injury or falls. Not worse at night time.  Taking trip to [place] today.  Wt Readings from Last 3 Encounters:  10/29/13 225 lb 8 oz (102.286 kg)  04/29/13 222 lb (100.699 kg)  12/10/12 212 lb 8 oz (96.389 kg)   Body mass index is 32.36 kg/(m^2).  Preventative:  Colonoscopy - 1999 for blood in stool, normal. No BM changes.  Prostate - strong stream, no nocturia. + fmhx prostate cancer - paternal grandfather at older age. Flu 04/2013 Tetanus 2005. Hold off for now, consider visit.  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  Allergies and medications reviewed and updated. Current Outpatient Prescriptions on File Prior to Visit  Medication Sig  . lithium carbonate (LITHOBID) 300 MG CR tablet Take 2 tablets (600 mg total) by mouth 2 (two) times daily.   No current facility-administered medications on file prior to visit.    Review of Systems  Constitutional: Negative for fever, chills, activity change, appetite change, fatigue and unexpected weight change.  HENT: Negative for hearing loss.   Eyes: Negative for visual disturbance.  Respiratory: Negative for cough, chest tightness, shortness of breath and wheezing.   Cardiovascular: Negative for chest pain,  palpitations and leg swelling.  Gastrointestinal: Positive for diarrhea (on lithium) and blood in stool (?hemorrhoid related). Negative for nausea, vomiting, abdominal pain, constipation and abdominal distention.  Genitourinary: Negative for hematuria and difficulty urinating.  Musculoskeletal: Negative for arthralgias, myalgias and neck pain.  Skin: Negative for rash.  Neurological: Negative for dizziness, seizures, syncope and headaches.  Hematological: Negative for adenopathy. Does not bruise/bleed easily.  Psychiatric/Behavioral: Negative for dysphoric mood. The patient is not nervous/anxious.    Per HPI unless specifically indicated above    Objective:    BP 110/72  Pulse 66  Temp(Src) 98.2 F (36.8 C) (Oral)  Ht 5\' 10"  (1.778 m)  Wt 225 lb 8 oz (102.286 kg)  BMI 32.36 kg/m2  SpO2 97%  Physical Exam  Nursing note and vitals reviewed. Constitutional: He is oriented to person, place, and time. He appears well-developed and well-nourished. No distress.  HENT:  Head: Normocephalic and atraumatic.  Right Ear: Hearing, external ear and ear canal normal.  Left Ear: Hearing, external ear and ear canal normal.  Nose: Nose normal.  Mouth/Throat: Uvula is midline, oropharynx is clear and moist and mucous membranes are normal. No oropharyngeal exudate, posterior oropharyngeal edema or posterior oropharyngeal erythema.  S/p bilateral TM reconstruction  Eyes: Conjunctivae and EOM are normal. Pupils are equal, round, and reactive to light. No scleral icterus.  Neck: Normal range of motion. Neck supple. No thyromegaly present.  Cardiovascular: Normal rate, regular rhythm, normal heart sounds and intact distal pulses.  No murmur heard. Pulses:      Radial pulses are 2+ on the right side, and 2+ on the left side.  Pulmonary/Chest: Effort normal and breath sounds normal. No respiratory distress. He has no wheezes. He has no rales.  Abdominal: Soft. Bowel sounds are normal. He exhibits no  distension and no mass. There is no tenderness. There is no rebound and no guarding.  Musculoskeletal: Normal range of motion. He exhibits no edema.  R shoulder WNL L Shoulder exam: No deformity of shoulders on inspection. Tender to palpation L subacromial bursa FROM in abduction and forward flexion. Pain with testing SITS in ext rotation and empty can sign. Pain with speed test. No impingement. No pain with crossover test. No pain with rotation of humeral head in Jordan joint.   Lymphadenopathy:    He has no cervical adenopathy.  Neurological: He is alert and oriented to person, place, and time.  CN grossly intact, station and gait intact  Skin: Skin is warm and dry. No rash noted.  Psychiatric: He has a normal mood and affect. His behavior is normal. Judgment and thought content normal.   Results for orders placed in visit on 10/22/13  LITHIUM LEVEL      Result Value Ref Range   Lithium Lvl 0.60 (*) 0.80 - 1.40 mEq/L  LIPID PANEL      Result Value Ref Range   Cholesterol 180  0 - 200 mg/dL   Triglycerides 102.0  0.0 - 149.0 mg/dL   HDL 58.30  >39.00 mg/dL   VLDL 20.4  0.0 - 40.0 mg/dL   LDL Cholesterol 101 (*) 0 - 99 mg/dL   Total CHOL/HDL Ratio 3     NonHDL 121.70    COMPREHENSIVE METABOLIC PANEL      Result Value Ref Range   Sodium 140  135 - 145 mEq/L   Potassium 4.6  3.5 - 5.1 mEq/L   Chloride 107  96 - 112 mEq/L   CO2 28  19 - 32 mEq/L   Glucose, Bld 101 (*) 70 - 99 mg/dL   BUN 9  6 - 23 mg/dL   Creatinine, Ser 0.9  0.4 - 1.5 mg/dL   Total Bilirubin 0.6  0.2 - 1.2 mg/dL   Alkaline Phosphatase 83  39 - 117 U/L   AST 27  0 - 37 U/L   ALT 32  0 - 53 U/L   Total Protein 6.6  6.0 - 8.3 g/dL   Albumin 4.3  3.5 - 5.2 g/dL   Calcium 9.5  8.4 - 10.5 mg/dL   GFR 92.79  >60.00 mL/min  CBC WITH DIFFERENTIAL      Result Value Ref Range   WBC 7.2  4.0 - 10.5 K/uL   RBC 5.34  4.22 - 5.81 Mil/uL   Hemoglobin 15.7  13.0 - 17.0 g/dL   HCT 46.4  39.0 - 52.0 %   MCV 86.8  78.0 -  100.0 fl   MCHC 33.8  30.0 - 36.0 g/dL   RDW 13.7  11.5 - 15.5 %   Platelets 260.0  150.0 - 400.0 K/uL   Neutrophils Relative % 58.3  43.0 - 77.0 %   Lymphocytes Relative 27.0  12.0 - 46.0 %   Monocytes Relative 10.6  3.0 - 12.0 %   Eosinophils Relative 3.6  0.0 - 5.0 %   Basophils Relative 0.5  0.0 - 3.0 %   Neutro Abs 4.2  1.4 - 7.7 K/uL   Lymphs Abs 1.9  0.7 - 4.0 K/uL   Monocytes Absolute 0.8  0.1 - 1.0 K/uL   Eosinophils Absolute 0.3  0.0 - 0.7 K/uL   Basophils Absolute 0.0  0.0 - 0.1 K/uL  TSH      Result Value Ref Range   TSH 1.64  0.35 - 4.50 uIU/mL      Assessment & Plan:   Problem List Items Addressed This Visit   Left shoulder pain     Anticipate L subacromial bursitis as well as RTC tendinopathy. Treat with naprosyn, rest, ice, and exercises from [facility] pt advisor provided today. Update if not better to consider steroid shot.    Healthcare maintenance - Primary     Preventative protocols reviewed and updated unless pt declined. Discussed healthy diet and lifestyle.    DISORDER, BIPOLAR NOS     Chronic, stable. Continue lithium.        Follow up plan: Return in about 1 year (around 10/30/2014), or if symptoms worsen or fail to improve, for annual exam, prior fasting for blood work.

## 2013-10-29 NOTE — Assessment & Plan Note (Signed)
Preventative protocols reviewed and updated unless pt declined. Discussed healthy diet and lifestyle.  

## 2013-10-29 NOTE — Assessment & Plan Note (Signed)
Chronic, stable. Continue lithium.

## 2013-10-29 NOTE — Patient Instructions (Signed)
Good to see you today, call us with questions. Safe trip. For left shoulder - I think you have combination of bursitis and rotator cuff tendinopathy. Treat with naprosyn with food for 5-7 days then as needed. May use ice and start stretching exercises provided today. If not better, let me know to discuss steroid shot

## 2014-01-19 ENCOUNTER — Other Ambulatory Visit: Payer: Self-pay | Admitting: Family Medicine

## 2014-09-28 ENCOUNTER — Ambulatory Visit (INDEPENDENT_AMBULATORY_CARE_PROVIDER_SITE_OTHER): Payer: BC Managed Care – PPO | Admitting: Family Medicine

## 2014-09-28 ENCOUNTER — Ambulatory Visit (INDEPENDENT_AMBULATORY_CARE_PROVIDER_SITE_OTHER)
Admission: RE | Admit: 2014-09-28 | Discharge: 2014-09-28 | Disposition: A | Discharge status: Home / Self Care | Payer: BC Managed Care – PPO | Source: Ambulatory Visit | Attending: Family Medicine | Admitting: Family Medicine

## 2014-09-28 ENCOUNTER — Encounter: Payer: Self-pay | Admitting: Family Medicine

## 2014-09-28 VITALS — BP 130/80 | HR 65 | Temp 98.3°F | Ht 70.0 in | Wt 229.8 lb

## 2014-09-28 DIAGNOSIS — M7542 Impingement syndrome of left shoulder: Secondary | ICD-10-CM

## 2014-09-28 DIAGNOSIS — M5412 Radiculopathy, cervical region: Secondary | ICD-10-CM

## 2014-09-28 MED ORDER — GABAPENTIN 300 MG PO CAPS: 300.0000 mg | ORAL_CAPSULE | Freq: Three times a day (TID) | ORAL | Status: DC

## 2014-09-28 NOTE — Patient Instructions (Signed)
REFERRALS TO SPECIALISTS, SPECIAL TESTS (MRI, CT, ULTRASOUNDS)  [facility] or [facility] will help you. ASK CHECK-IN FOR HELP.  Imaging / Special Testing referrals sometimes can be done same day if EMERGENCY, but others can take 2 or 3 days to get an appointment. Starting in 2015, many of the new Medicare plans and Obamacare plans take much longer.   Specialist appointment times vary a great deal, based on their schedule / openings. -- Some specialists have very long wait times. (Example. Dermatology. Multiple months  for non-cancer)    Generic Gabapentin Titration Schedule  Generic Gabapentin (generic form of Neurontin) comes in 300 mg tablets or capsules.   You have to titrate your dose slowly to reduce side effects and reduce sedation / sleepiness.    Week               Breakfast  Lunch   Dinner One                 0   0   300 mg Two   300mg    0   300mg  Three  300mg    300mg    300mg   If you have any problems at any time, drop back to the previous dosing schedule. Continue with this dose for 1 week, and then try to go up the next step again.

## 2014-09-28 NOTE — Progress Notes (Signed)
Pre visit review using our clinic review tool, if applicable. No additional management support is needed unless otherwise documented below in the visit note. 

## 2014-09-28 NOTE — Progress Notes (Signed)
Dr. Frederico Hamman T. Takela Varden, MD, [facility] [place], [address] Phone: [phone] Fax: [phone]  09/28/2014  Patient: Erik Smith, MRN: [id number], DOB: 1966-04-01, 49 y.o.  Primary Physician:  Ria Bush, MD  Chief Complaint: Shoulder Pain and Neck Pain  Subjective:   Erik Smith is a 49 y.o. very pleasant male patient who presents with the following:  Left arm and shouldre, and for the past 4-6 weeks and numbness and especially on the pinky side. Dr. Darnell Level thought that he had some partial cuff tear. This eval was 11 months ago! He primarily is having pain with abduction and terminal internal range of motion at this point, and this is all on the left side.  Certain places will hurt with movement in the shoulder ROM.  Neck has been hurting since the 1990's.  2002 had a cervical laminectomy. Neck ROM is decreased. Exact procedure is unknown. No interventions since then.   Naprosyn has not helped much.  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  No shoulder interventions in the past.   History is also significant for the patient being manic depressive, and he is stopped all of his medications at this point.  Currently appears to be interacting with me normally in the office without evidence of significant hypomania or depression.  Past Medical History, Surgical History, Social History, Family History, Problem List, Medications, and Allergies have been reviewed and updated if relevant.  GEN: No fevers, chills. Nontoxic. Primarily MSK c/o today. MSK: Detailed in the HPI GI: tolerating PO intake without difficulty Neuro: as above Otherwise the pertinent positives of the ROS are noted above.   Objective:   BP 130/80 mmHg  Pulse 65  Temp(Src) 98.3 F (36.8 C) (Oral)  Ht 5\' 10"  (1.778 m)  Wt 229 lb 12 oz (104.214 kg)  BMI 32.97 kg/m2   GEN: Well-developed,well-nourished,in no acute distress; alert,appropriate and  cooperative throughout examination HEENT: Normocephalic and atraumatic without obvious abnormalities. Ears, externally no deformities PULM: Breathing comfortably in no respiratory distress EXT: No clubbing, cyanosis, or edema PSYCH: Normally interactive. Cooperative during the interview. Pleasant. Friendly and conversant. Not anxious or depressed appearing. Normal, full affect.  Shoulder: L Inspection: No muscle wasting or winging Ecchymosis/edema: neg  AC joint, scapula, clavicle: NT  CERVICAL SPINE EXAM Range of motion: Flexion, extension, lateral bending, and rotation: modest restriction only Pain with terminal motion: mild Spinous Processes: NT SCM: NT Upper paracervical muscles: mild Upper traps: NT C5-T1 intact, sensation and motor   Spurling's: neg Abduction: full, 5/5, painful arc of motion Flexion: full, 5/5 IR, full, lift-off: 5/5 ER at neutral: full, 5/5 AC crossover: neg Neer: pos Hawkins: pos Drop Test: neg Empty Can: pos Supraspinatus insertion: mild-mod T Bicipital groove: NT Speed's: neg Yergason's: neg Sulcus sign: neg Scapular dyskinesis: notable winging C5-T1 intact  Neuro: Sensation intact Grip 5/5   Radiology: Dg Cervical Spine Complete  09/28/2014   CLINICAL DATA:  Left cervical radiculopathy for 5 weeks, no injury.  EXAM: CERVICAL SPINE  4+ VIEWS  COMPARISON:  None.  FINDINGS: Cervical spine is visualized from the occiput to the cervicothoracic junction. Alignment is anatomic. Vertebral body height is maintained. Prevertebral soft tissues are within normal limits. Endplate degenerative changes, loss of disc space height, uncovertebral hypertrophy and facet sclerosis are seen at C5-6 and C6-7. Mild neural foraminal narrowing on the left at C5-6 and C6-7. Visualized lung apices show left apical pleural thickening.  IMPRESSION: 1. No acute findings.  2. Degenerative disc disease at C5-6 and C6-7.   Electronically Signed   By: Lorin Picket M.D.   On:  09/28/2014 16:55   Dg Shoulder Left  09/28/2014   CLINICAL DATA:  Left shoulder pain for 1 year, no trauma. Left arm numbness.  EXAM: LEFT SHOULDER - 2+ VIEW  COMPARISON:  None.  FINDINGS: No acute osseous or joint abnormality. There may be trace degenerative change along the inferior margin of the glenohumeral joint.  IMPRESSION: 1. No acute findings. 2. Question trace degenerative change along the inferior margin of the glenohumeral joint.   Electronically Signed   By: Lorin Picket M.D.   On: 09/28/2014 16:56    The radiological images were independently reviewed by myself in the office and results were reviewed with the patient. My independent interpretation of images:  No significant GH or AC joint arthropathy on my interpretation. No fx or dislocation. Owens Loffler, MD   Assessment and Plan:   Impingement syndrome of left shoulder - Plan: DG Shoulder Left, Ambulatory referral to Physical Therapy  Left cervical radiculopathy - Plan: DG Cervical Spine Complete, Ambulatory referral to Physical Therapy  The patient's history of cervical spine surgery and cervical radiculopathy confound this case, but I think that most of his symptoms are from his shoulder.  We will start some relatively low dose gabapentin, which she has not tried before, and see if some physical therapy and traction will help with his neck. RTC strong and appears intact.  He also has some fairly significant scapular dyskinesis on examination, and I hope that physical therapy will help with this also.  I would be reluctant to inject this patient's shoulder and highly reluctant to recommend cervical spine injections without him being on any form of mood stabilizer given risk for induction of mania.  Generic Gabapentin Titration Schedule  Generic Gabapentin (generic form of Neurontin) comes in 300 mg tablets or capsules.   You have to titrate your dose slowly to reduce side effects and reduce sedation / sleepiness.     Week               Breakfast  Lunch   Dinner One                 0   0   300 mg Two   300mg    0   300mg  Three  300mg    300mg    300mg   If you have any problems at any time, drop back to the previous dosing schedule. Continue with this dose for 1 week, and then try to go up the next step again.    Follow-up: 2 mo   New Prescriptions   GABAPENTIN (NEURONTIN) 300 MG CAPSULE    Take 1 capsule (300 mg total) by mouth 3 (three) times daily.   Orders Placed This Encounter  Procedures  . DG Cervical Spine Complete  . DG Shoulder Left  . Ambulatory referral to Physical Therapy    Signed,  Frederico Hamman T. Dhanvin Szeto, MD   Patient's Medications  New Prescriptions   GABAPENTIN (NEURONTIN) 300 MG CAPSULE    Take 1 capsule (300 mg total) by mouth 3 (three) times daily.  Previous Medications   LITHIUM CARBONATE (LITHOBID) 300 MG CR TABLET    TAKE 2 TABLETS BY MOUTH TWICE A DAY   NAPROXEN (NAPROSYN) 500 MG TABLET    Take one po bid x 1 week then prn pain, take with food  Modified Medications   No medications on  file  Discontinued Medications   No medications on file

## 2014-10-01 ENCOUNTER — Encounter: Payer: Self-pay | Admitting: Family Medicine

## 2014-10-01 ENCOUNTER — Ambulatory Visit: Payer: Self-pay | Admitting: Family Medicine

## 2014-11-05 ENCOUNTER — Emergency Department: Payer: BC Managed Care – PPO

## 2014-11-05 ENCOUNTER — Emergency Department
Admission: EM | Admit: 2014-11-05 | Discharge: 2014-11-05 | Disposition: A | Discharge status: Home / Self Care | Payer: BC Managed Care – PPO | Attending: Emergency Medicine | Admitting: Emergency Medicine

## 2014-11-05 ENCOUNTER — Encounter: Payer: Self-pay | Admitting: *Deleted

## 2014-11-05 DIAGNOSIS — Y9289 Other specified places as the place of occurrence of the external cause: Secondary | ICD-10-CM | POA: Insufficient documentation

## 2014-11-05 DIAGNOSIS — Y288XXA Contact with other sharp object, undetermined intent, initial encounter: Secondary | ICD-10-CM | POA: Insufficient documentation

## 2014-11-05 DIAGNOSIS — Z791 Long term (current) use of non-steroidal anti-inflammatories (NSAID): Secondary | ICD-10-CM | POA: Diagnosis not present

## 2014-11-05 DIAGNOSIS — Y9389 Activity, other specified: Secondary | ICD-10-CM | POA: Insufficient documentation

## 2014-11-05 DIAGNOSIS — Y998 Other external cause status: Secondary | ICD-10-CM | POA: Diagnosis not present

## 2014-11-05 DIAGNOSIS — S92912B Unspecified fracture of left toe(s), initial encounter for open fracture: Secondary | ICD-10-CM

## 2014-11-05 DIAGNOSIS — Z792 Long term (current) use of antibiotics: Secondary | ICD-10-CM | POA: Diagnosis not present

## 2014-11-05 DIAGNOSIS — S99922A Unspecified injury of left foot, initial encounter: Secondary | ICD-10-CM | POA: Diagnosis present

## 2014-11-05 DIAGNOSIS — Z79899 Other long term (current) drug therapy: Secondary | ICD-10-CM | POA: Insufficient documentation

## 2014-11-05 DIAGNOSIS — S91115A Laceration without foreign body of left lesser toe(s) without damage to nail, initial encounter: Secondary | ICD-10-CM | POA: Insufficient documentation

## 2014-11-05 DIAGNOSIS — S92532B Displaced fracture of distal phalanx of left lesser toe(s), initial encounter for open fracture: Secondary | ICD-10-CM | POA: Insufficient documentation

## 2014-11-05 MED ORDER — BUPIVACAINE HCL 0.5 % IJ SOLN: 50.0000 mL | Freq: Once | INTRAMUSCULAR | Status: DC

## 2014-11-05 MED ORDER — OXYCODONE HCL 5 MG PO TABS
5.0000 mg | ORAL_TABLET | Freq: Once | ORAL | Status: AC
  Administered 2014-11-05: 5 mg via ORAL

## 2014-11-05 MED ORDER — LIDOCAINE HCL (PF) 1 % IJ SOLN
INTRAMUSCULAR | Status: AC
  Filled 2014-11-05: qty 5

## 2014-11-05 MED ORDER — DIAZEPAM 2 MG PO TABS
2.0000 mg | ORAL_TABLET | Freq: Once | ORAL | Status: AC
  Administered 2014-11-05: 2 mg via ORAL

## 2014-11-05 MED ORDER — SULFAMETHOXAZOLE-TRIMETHOPRIM 800-160 MG PO TABS
1.0000 | ORAL_TABLET | Freq: Once | ORAL | Status: AC
  Administered 2014-11-05: 1 via ORAL

## 2014-11-05 MED ORDER — OXYCODONE HCL 5 MG PO TABS
ORAL_TABLET | ORAL | Status: AC
  Administered 2014-11-05: 5 mg via ORAL
  Filled 2014-11-05: qty 1

## 2014-11-05 MED ORDER — BUPIVACAINE HCL (PF) 0.25 % IJ SOLN
INTRAMUSCULAR | Status: AC
  Filled 2014-11-05: qty 60

## 2014-11-05 MED ORDER — LIDOCAINE HCL (PF) 1 % IJ SOLN: 5.0000 mL | Freq: Once | INTRAMUSCULAR | Status: DC

## 2014-11-05 MED ORDER — CEPHALEXIN 500 MG PO CAPS
ORAL_CAPSULE | ORAL | Status: AC
  Filled 2014-11-05: qty 1

## 2014-11-05 MED ORDER — DIAZEPAM 2 MG PO TABS
ORAL_TABLET | ORAL | Status: AC
  Administered 2014-11-05: 2 mg via ORAL
  Filled 2014-11-05: qty 1

## 2014-11-05 MED ORDER — SULFAMETHOXAZOLE-TRIMETHOPRIM 800-160 MG PO TABS: 1.0000 | ORAL_TABLET | Freq: Two times a day (BID) | ORAL | Status: DC

## 2014-11-05 MED ORDER — CEPHALEXIN 500 MG PO CAPS
500.0000 mg | ORAL_CAPSULE | Freq: Once | ORAL | Status: AC
  Administered 2014-11-05: 500 mg via ORAL

## 2014-11-05 MED ORDER — OXYCODONE-ACETAMINOPHEN 5-325 MG PO TABS: 1.0000 | ORAL_TABLET | ORAL | Status: DC | PRN

## 2014-11-05 MED ORDER — SULFAMETHOXAZOLE-TRIMETHOPRIM 800-160 MG PO TABS
ORAL_TABLET | ORAL | Status: AC
  Filled 2014-11-05: qty 1

## 2014-11-05 MED ORDER — CEPHALEXIN 500 MG PO CAPS: 500.0000 mg | ORAL_CAPSULE | Freq: Four times a day (QID) | ORAL | Status: DC

## 2014-11-05 MED ORDER — IBUPROFEN 800 MG PO TABS: 800.0000 mg | ORAL_TABLET | Freq: Three times a day (TID) | ORAL | Status: DC | PRN

## 2014-11-05 NOTE — ED Notes (Signed)
Pt discharged home after verbalizing understanding of discharge instructions; nad noted. 

## 2014-11-05 NOTE — Discharge Instructions (Signed)
Toe Fracture Your caregiver has diagnosed you as having a fractured toe. A toe fracture is a break in the bone of a toe. "Buddy taping" is a way of splinting your broken toe, by taping the broken toe to the toe next to it. This "buddy taping" will keep the injured toe from moving beyond normal range of motion. Buddy taping also helps the toe heal in a more normal alignment. It may take 6 to 8 weeks for the toe injury to heal. [place] your toes taped together for as long as directed by your caregiver or until you see a doctor for a follow-up examination. You can change the tape after bathing. Always use a small piece of gauze or cotton between the toes when taping them together. This will help the skin stay dry and prevent infection.  Apply ice to the injury for 15-20 minutes each hour while awake for the first 2 days. Put the ice in a plastic bag and place a towel between the bag of ice and your skin.  After the first 2 days, apply heat to the injured area. Use heat for the next 2 to 3 days. Place a heating pad on the foot or soak the foot in warm water as directed by your caregiver.  Keep your foot elevated as much as possible to lessen swelling.  Wear sturdy, supportive shoes. The shoes should not pinch the toes or fit tightly against the toes.  Your caregiver may prescribe a rigid shoe if your foot is very swollen.  Your may be given crutches if the pain is too great and it hurts too much to walk.  Only take over-the-counter or prescription medicines for pain, discomfort, or fever as directed by your caregiver.  If your caregiver has given you a follow-up appointment, it is very important to keep that appointment. Not keeping the appointment could result in a chronic or permanent injury, pain, and disability. If there is any problem keeping the appointment, you must call back to this facility for assistance. SEEK MEDICAL CARE IF:   You have increased pain or swelling,  not relieved with medications.  The pain does not get better after 1 week.  Your injured toe is cold when the others are warm. SEEK IMMEDIATE MEDICAL CARE IF:   The toe becomes cold, numb, or white.  The toe becomes hot (inflamed) and red. Document Released: 04/21/2000 Document Revised: 07/17/2011 Document Reviewed: 12/09/2007 [facility] Patient Information 2015 [place], [place]. This information is not intended to replace advice given to you by your health care provider. Make sure you discuss any questions you have with your health care provider.  Foot elevated for the next 4 days. All with orthopedics, Dr. Sabra Heck as directed. No full weightbearing, use crutches.

## 2014-11-05 NOTE — ED Notes (Signed)
Pt has pain in left 5th toe.  Pt states his toe got caught on the mat while doing [place] [employer] [employer].  Bleeding controlled.  States toe pulled off the bone.

## 2014-11-05 NOTE — ED Provider Notes (Addendum)
[facility] Emergency Department Provider Note  ____________________________________________  Time seen: Approximately 9:01 PM  I have reviewed the triage vital signs and the nursing notes.   HISTORY  Chief Complaint Toe Injury   HPI Erik Smith is a 49 y.o. male who presents for evaluation of a laceration to his left fifth toe. Patient states that "the entire outer skinning covering came off the toe and he re-capped the toe by putting the skin back on."   Past Medical History  Diagnosis Date  . Unspecified adjustment reaction   . History of asthma   . Migraine without aura, without mention of intractable migraine without mention of status migrainosus   . Bipolar disorder, unspecified     hospitalization 2004 for depression, bipolar, ambien overdose  . Family history of ischemic heart disease   . Cholesteatoma 05/2010    s/p R ear surgery, residual tinnitus, planning on L ear surgery  . Spinal stenosis   . Arthritis     Patient Active Problem List   Diagnosis Date Noted  . Left shoulder pain 10/29/2013  . Healthcare maintenance 12/19/2010  . DISORDER, BIPOLAR NOS 01/14/2007  . DEPRESSION 01/14/2007  . COMMON MIGRAINE 01/14/2007  . TINNITUS, CHRONIC 01/14/2007  . ASTHMA 01/14/2007  . NECK PAIN, CHRONIC 01/14/2007    Past Surgical History  Procedure Laterality Date  . Inner ear surgery  [phone] and 1995    for cholesteatoma, bilateral first L then R  . Esophagus surgery  2003    torn esophagus  . Neck surgery  2002    spinal stenosis and arthritis  . Perianal abscess  1998  . Colonoscopy  1999    normal (in [place])    Current Outpatient Rx  Name  Route  Sig  Dispense  Refill  . cephALEXin (KEFLEX) 500 MG capsule   Oral   Take 1 capsule (500 mg total) by mouth 4 (four) times daily.   40 capsule   0   . gabapentin (NEURONTIN) 300 MG capsule   Oral   Take 1 capsule (300 mg total) by mouth 3 (three) times daily.   90 capsule    3   . ibuprofen (ADVIL,MOTRIN) 800 MG tablet   Oral   Take 1 tablet (800 mg total) by mouth every 8 (eight) hours as needed.   30 tablet   0   . lithium carbonate (LITHOBID) 300 MG CR tablet      TAKE 2 TABLETS BY MOUTH TWICE A DAY Patient not taking: Reported on 09/28/2014   120 tablet   3   . naproxen (NAPROSYN) 500 MG tablet      Take one po bid x 1 week then prn pain, take with food   60 tablet   0   . oxyCODONE-acetaminophen (ROXICET) 5-325 MG per tablet   Oral   Take 1-2 tablets by mouth every 4 (four) hours as needed for severe pain.   15 tablet   0   . sulfamethoxazole-trimethoprim (BACTRIM DS,SEPTRA DS) 800-160 MG per tablet   Oral   Take 1 tablet by mouth 2 (two) times daily.   20 tablet   0     Allergies Morphine and related  Family History  Problem Relation Age of Onset  . Coronary artery disease Father 66  . Prostate cancer Paternal Grandfather 30    deceased from prostate CA  . Lung cancer Paternal Grandmother 37    smoker  . Sickle cell anemia Mother   .  Asthma Mother     smoker  . Breast cancer Paternal Uncle   . Stroke Neg Hx   . Diabetes Neg Hx     Social History History  Substance Use Topics  . Smoking status: Never Smoker   . Smokeless tobacco: Never Used  . Alcohol Use: Yes     Comment: 1 beer/day    Review of Systems Constitutional: No fever/chills Eyes: No visual changes. ENT: No sore throat. Cardiovascular: Denies chest pain. Respiratory: Denies shortness of breath. Gastrointestinal: No abdominal pain.  No nausea, no vomiting.  No diarrhea.  No constipation. Genitourinary: Negative for dysuria. Musculoskeletal: Negative for back pain. Skin: Positive for laceration and left foot pain, 5th toe Neurological: Negative for headaches, focal weakness or numbness.  10-point ROS otherwise negative.  ____________________________________________   PHYSICAL EXAM:  VITAL SIGNS: ED Triage Vitals  Enc Vitals Group     BP  11/05/14 2028 141/102 mmHg     Pulse Rate 11/05/14 2028 85     Resp 11/05/14 2028 20     Temp 11/05/14 2028 98.6 F (37 C)     Temp Source 11/05/14 2028 Oral     SpO2 11/05/14 2028 96 %     Weight 11/05/14 2028 220 lb (99.791 kg)     Height 11/05/14 2028 5\' 10"  (1.778 m)     Head Cir --      Peak Flow --      Pain Score --      Pain Loc --      Pain Edu? --      Excl. in [place]? --      Constitutional: Alert and oriented. Well appearing and in no acute distress. Eyes: Conjunctivae are normal. PERRL. EOMI. Head: Atraumatic. Nose: No congestion/rhinnorhea. Mouth/Throat: Mucous membranes are moist.  Oropharynx non-erythematous. Neck: No stridor.   Cardiovascular: Normal rate, regular rhythm. Grossly normal heart sounds.  Good peripheral circulation. Respiratory: Normal respiratory effort.  No retractions. Lungs CTAB. Gastrointestinal: Soft and nontender. No distention. No abdominal bruits. No CVA tenderness. Musculoskeletal: No lower extremity tenderness nor edema.  No joint effusions. Neurologic:  Normal speech and language. No gross focal neurologic deficits are appreciated. Speech is normal. No gait instability. Skin:  Skin is warm, dry and intact. No rash noted. Psychiatric: Mood and affect are normal. Speech and behavior are normal.  ____________________________________________   LABS (all labs ordered are listed, but only abnormal results are displayed)  Labs Reviewed - No data to display ____________________________________________  EKG  Not applicable ____________________________________________  RADIOLOGY  Distal fifth phalanx displaced fracture interpreted by radiologist and reviewed by myself. ____________________________________________   PROCEDURES  Procedure(s) performed: YES  LACERATION REPAIR Performed by: Sherrie George, FNP-C Authorized by: Sherrie George, FNP-C Consent: Verbal consent obtained. Risks and benefits: risks, benefits and alternatives  were discussed Consent given by: patient Patient identity confirmed: provided demographic data Prepped and Draped in normal sterile fashion Wound explored  Laceration Location: Right 5th toe  Laceration Length: 3.5cm  No Foreign Bodies seen or palpated  Anesthesia: local infiltration  Local anesthetic: lidocaine1% without epinephrine, with Bupivicaine 0.25%  Anesthetic total: 3 ml  Irrigation method: syringe Amount of cleaning: standard  Skin closure:4-0 Nylon  Number of sutures: 6  Technique: Simple interrupted, loosely closed per Dr. Ammie Ferrier instruction.  Patient tolerance: Patient tolerated the procedure well with no immediate complications.  Critical Care performed: No  ____________________________________________   INITIAL IMPRESSION / ASSESSMENT AND PLAN / ED COURSE  Pertinent labs & imaging results  that were available during my care of the patient were reviewed by me and considered in my medical decision making (see chart for details).  Open displaced fracture cussed with Dr. Sabra Heck, orthopedic provider on call. Instructions given to loosely close the laceration after copiously irrigating with saline. Keep foot elevated start on antibiotics Keflex and Bactrim provided crutches and have patient follow-up with orthopedics next week. Patient tolerated procedure well voices no other urgency medical complaints at this time. Patient reports Tdap up-to-date prior to departure. ____________________________________________   FINAL CLINICAL IMPRESSION(S) / ED DIAGNOSES  Final diagnoses:  Toe fracture, left, open, initial encounter      Arlyss Repress, PA-C 11/05/14 [email or web address]  Medical screening examination/treatment/procedure(s) were performed by non-physician practitioner and as supervising physician I was immediately available for consultation/collaboration.   Earleen Newport, MD 11/16/14 0700  Earleen Newport, MD 11/18/14 [phone]

## 2014-11-05 NOTE — ED Notes (Signed)
Pt presents with injury to left 5th toe. Reports that he was doing martial arts and came down and caught his toe in the mat and it de-gloved. Pt alert & oriented with NAD. Pt denies pain.

## 2014-11-12 ENCOUNTER — Ambulatory Visit (INDEPENDENT_AMBULATORY_CARE_PROVIDER_SITE_OTHER): Payer: BC Managed Care – PPO | Admitting: Family Medicine

## 2014-11-12 ENCOUNTER — Encounter: Payer: Self-pay | Admitting: Family Medicine

## 2014-11-12 VITALS — BP 110/70 | HR 84 | Temp 98.2°F | Wt 216.5 lb

## 2014-11-12 DIAGNOSIS — S99922D Unspecified injury of left foot, subsequent encounter: Secondary | ICD-10-CM

## 2014-11-12 DIAGNOSIS — Z23 Encounter for immunization: Secondary | ICD-10-CM

## 2014-11-12 DIAGNOSIS — S99922A Unspecified injury of left foot, initial encounter: Secondary | ICD-10-CM | POA: Insufficient documentation

## 2014-11-12 NOTE — Progress Notes (Signed)
Pre visit review using our clinic review tool, if applicable. No additional management support is needed unless otherwise documented below in the visit note. 

## 2014-11-12 NOTE — Progress Notes (Addendum)
BP 110/70 mmHg  Pulse 84  Temp(Src) 98.2 F (36.8 C) (Oral)  Wt 216 lb 8 oz (98.204 kg)   CC: f/u [facility] ER, suture removal  Subjective:    Patient ID: Erik Smith, male    DOB: August 05, 1965, 49 y.o.   MRN: [id number]  HPI: Erik Smith is a 49 y.o. male presenting on 11/12/2014 for Follow-up   Records reviewed  Seen at [facility] ER 11/05/2014 - R pinky toe degloving injury while doing martial arts 11/05/2014. Xray showed distal 5th phalanx displaced fracture. Laceration repaired with 6 stitches. Placed on Keflex/Bactrim course. rec f/u with ortho.   Td 2005.  Relevant past medical, surgical, family and social history reviewed and updated as indicated. Interim medical history since our last visit reviewed. Allergies and medications reviewed and updated. Current Outpatient Prescriptions on File Prior to Visit  Medication Sig  . cephALEXin (KEFLEX) 500 MG capsule Take 1 capsule (500 mg total) by mouth 4 (four) times daily.  Marland Kitchen gabapentin (NEURONTIN) 300 MG capsule Take 1 capsule (300 mg total) by mouth 3 (three) times daily.  Marland Kitchen ibuprofen (ADVIL,MOTRIN) 800 MG tablet Take 1 tablet (800 mg total) by mouth every 8 (eight) hours as needed.  . naproxen (NAPROSYN) 500 MG tablet Take one po bid x 1 week then prn pain, take with food  . oxyCODONE-acetaminophen (ROXICET) 5-325 MG per tablet Take 1-2 tablets by mouth every 4 (four) hours as needed for severe pain.  Marland Kitchen sulfamethoxazole-trimethoprim (BACTRIM DS,SEPTRA DS) 800-160 MG per tablet Take 1 tablet by mouth 2 (two) times daily.  Marland Kitchen lithium carbonate (LITHOBID) 300 MG CR tablet TAKE 2 TABLETS BY MOUTH TWICE A DAY   No current facility-administered medications on file prior to visit.    Review of Systems Per HPI unless specifically indicated above     Objective:    BP 110/70 mmHg  Pulse 84  Temp(Src) 98.2 F (36.8 C) (Oral)  Wt 216 lb 8 oz (98.204 kg)  Wt Readings from Last 3 Encounters:  11/16/14 216 lb (97.977 kg)  11/12/14 216 lb 8 oz  (98.204 kg)  11/05/14 220 lb (99.791 kg)    Physical Exam  Constitutional: He appears well-developed and well-nourished. No distress.  Musculoskeletal: He exhibits edema.  Swelling L lateral toe around 5th digit 5th digit neurovascularly intact.  Skin: Skin is warm and dry.  6 sutures in place on inside of base of 5th digit with skin well approximated, some maceration present.  No erythema/drainage appreciated.  Nursing note and vitals reviewed.   LEFT FOOT - COMPLETE 3+ VIEW COMPARISON: None. FINDINGS: Moderately displaced fracture is seen involving the medial aspect of the distal portion of the fifth proximal phalanx. This appears to be closed and posttraumatic. Joint spaces are intact. No radiopaque foreign body is noted. IMPRESSION: Moderately displaced fracture is seen involving the distal portion of the fifth proximal phalanx. Electronically Signed  By: Marijo Conception, M.D.  On: 11/05/2014 21:02    Assessment & Plan:  Over 25 minutes were spent face-to-face with the patient during this encounter and >50% of that time was spent on counseling and coordination of care  Problem List Items Addressed This Visit    Injury of toe on left foot - Primary    Today is day 6 after stitches placed - sutures not ready to remove yet - RTC Monday for suture removal and re-eval.  Xray reviewed with patient.  Do recommend f/u with ortho given it was an open intra articular fracture.  Discussed all this with patient, including risks of bone and joint infection, risks of arthritis and future pain/disability if not healing well. Pt very hesitant for ortho referral for financial reasons. Discussed possibility of osteomyelitis, with hospitalization IV abx and possible amputation.  Pt will consider over weekend and let me know decision when he returns.  Wound redressed and 5th digit buddy taped to 4th. rec crutches and non -weight bearing as much as possible along with use of closed toed shoes as  much as possible to optimize healing. Tdap updated today.       Other Visit Diagnoses    Need for Tdap vaccination        Relevant Orders    Tdap vaccine greater than or equal to 7yo IM (Completed)        Follow up plan: No Follow-up on file.

## 2014-11-12 NOTE — Patient Instructions (Addendum)
Tdap today. Stitches not ready yet for removal return Monday for re evaluation. I do recommend ortho involvement - let me know if you decide to do this. After shower, soak a few minutes in water then pat dry, then apply neosporin and buddy tape to 4th toe - every day.  Wear closed toed shoes as much as able. Elevate leg when sitting down.

## 2014-11-12 NOTE — Assessment & Plan Note (Addendum)
Today is day 6 after stitches placed - sutures not ready to remove yet - RTC Monday for suture removal and re-eval.  Xray reviewed with patient.  Do recommend f/u with ortho given it was an open intra articular fracture. Discussed all this with patient, including risks of bone and joint infection, risks of arthritis and future pain/disability if not healing well. Pt very hesitant for ortho referral for financial reasons. Discussed possibility of osteomyelitis, with hospitalization IV abx and possible amputation.  Pt will consider over weekend and let me know decision when he returns.  Wound redressed and 5th digit buddy taped to 4th. rec crutches and non -weight bearing as much as possible along with use of closed toed shoes as much as possible to optimize healing. Tdap updated today.

## 2014-11-12 NOTE — Addendum Note (Signed)
Addended by: Royann Shivers A on: 11/12/2014 11:05 AM   Modules accepted: Orders

## 2014-11-16 ENCOUNTER — Encounter: Payer: Self-pay | Admitting: Family Medicine

## 2014-11-16 ENCOUNTER — Ambulatory Visit (INDEPENDENT_AMBULATORY_CARE_PROVIDER_SITE_OTHER): Payer: BC Managed Care – PPO | Admitting: Family Medicine

## 2014-11-16 VITALS — BP 128/80 | HR 76 | Temp 98.4°F | Wt 216.0 lb

## 2014-11-16 DIAGNOSIS — S99922D Unspecified injury of left foot, subsequent encounter: Secondary | ICD-10-CM

## 2014-11-16 NOTE — Progress Notes (Signed)
Pre visit review using our clinic review tool, if applicable. No additional management support is needed unless otherwise documented below in the visit note. 

## 2014-11-16 NOTE — Assessment & Plan Note (Signed)
Stitches removed. Pt tolerated well. Discussed ortho referral. Pt today agrees to this. Has seen [place] [facility] in past [employer]). Will refer today. Buddy tape toe until seen.

## 2014-11-16 NOTE — Patient Instructions (Addendum)
Good to see you today. Stitches removed. Pat dry after shower, then have some time with buddy tape and shoe off to allow for drying, then use buddy tape and close toed shoe. Referral to ortho.

## 2014-11-16 NOTE — Progress Notes (Addendum)
BP 128/80 mmHg  Pulse 76  Temp(Src) 98.4 F (36.9 C) (Oral)  Wt 216 lb (97.977 kg)   CC: 4 d f/u  Subjective:    Patient ID: Erik Smith, male    DOB: 1965-05-22, 49 y.o.   MRN: [id number]  HPI: Erik Smith is a 49 y.o. male presenting on 11/16/2014 for Follow-up   See prior note for details.  Degloving injury to LEFT pinky. Seen at [facility] ER on 11/05/2014, xray showing distal 5th phalax displaced fracture with bone chip in articulation. Stitched with 6 stitches, placed on keflex/bactrim which he is finishing. rec f/u with ortho. F/u with me on 11/12/2014. Did not want to f/u with ortho. Discussed multiple reasons to f/u with ortho.  Last visit stitches not ready to be removed. Here today for stitch removal. Agrees to ortho referral today.  Tdap updated last visit.  Relevant past medical, surgical, family and social history reviewed and updated as indicated. Interim medical history since our last visit reviewed. Allergies and medications reviewed and updated. Current Outpatient Prescriptions on File Prior to Visit  Medication Sig  . cephALEXin (KEFLEX) 500 MG capsule Take 1 capsule (500 mg total) by mouth 4 (four) times daily.  Marland Kitchen gabapentin (NEURONTIN) 300 MG capsule Take 1 capsule (300 mg total) by mouth 3 (three) times daily.  Marland Kitchen ibuprofen (ADVIL,MOTRIN) 800 MG tablet Take 1 tablet (800 mg total) by mouth every 8 (eight) hours as needed.  . lithium carbonate (LITHOBID) 300 MG CR tablet TAKE 2 TABLETS BY MOUTH TWICE A DAY  . naproxen (NAPROSYN) 500 MG tablet Take one po bid x 1 week then prn pain, take with food  . oxyCODONE-acetaminophen (ROXICET) 5-325 MG per tablet Take 1-2 tablets by mouth every 4 (four) hours as needed for severe pain.  Marland Kitchen sulfamethoxazole-trimethoprim (BACTRIM DS,SEPTRA DS) 800-160 MG per tablet Take 1 tablet by mouth 2 (two) times daily.   No current facility-administered medications on file prior to visit.   Past Medical History  Diagnosis Date  .  Unspecified adjustment reaction   . History of asthma   . Migraine without aura, without mention of intractable migraine without mention of status migrainosus   . Bipolar disorder, unspecified     hospitalization 2004 for depression, bipolar, ambien overdose  . Family history of ischemic heart disease   . Cholesteatoma 05/2010    s/p R ear surgery, residual tinnitus, planning on L ear surgery  . Spinal stenosis   . Arthritis     Past Surgical History  Procedure Laterality Date  . Inner ear surgery  [phone] and 1995    for cholesteatoma, bilateral first L then R  . Esophagus surgery  2003    torn esophagus  . Neck surgery  2002    spinal stenosis and arthritis  . Perianal abscess  1998  . Colonoscopy  1999    normal (in [place])    History   Social History  . Marital Status: Married    Spouse Name: N/A  . Number of Children: 2  . Years of Education: N/A   Occupational History  . 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  [place] History Main Topics  . Smoking status: Never Smoker   . Smokeless tobacco: Never Used  . Alcohol Use: Yes     Comment: 1 beer/day  . Drug Use: No  . Sexual Activity: Not on file   Other Topics Concern  . 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 No distress.  Musculoskeletal: He exhibits edema.  Swelling L lateral toe around 5th digit 5th digit neurovascularly intact.  Skin: Skin is warm and dry.  6 sutures  removed. Maceration present.  No erythema/drainage appreciated.  Nursing note and vitals reviewed.     Assessment & Plan:  Injury only to LEFT foot, not right.  Problem List Items Addressed This Visit    Injury of toe on left foot - Primary    Stitches removed. Pt tolerated well. Discussed ortho referral. Pt today agrees to this. Has seen [place] [facility] in past [employer]). Will refer today. Buddy tape toe until seen.      Relevant Orders   Ambulatory referral to Orthopedic Surgery       Follow up plan: Return if symptoms worsen or fail to improve.

## 2015-04-06 ENCOUNTER — Other Ambulatory Visit: Payer: Self-pay | Admitting: Family Medicine

## 2015-04-06 ENCOUNTER — Encounter: Payer: Self-pay | Admitting: Family Medicine

## 2015-04-06 MED ORDER — NAPROXEN 500 MG PO TABS: 500.0000 mg | ORAL_TABLET | Freq: Two times a day (BID) | ORAL | Status: DC

## 2015-05-09 DIAGNOSIS — L719 Rosacea, unspecified: Secondary | ICD-10-CM

## 2015-05-09 HISTORY — DX: Rosacea, unspecified: L71.9

## 2015-06-25 ENCOUNTER — Ambulatory Visit (INDEPENDENT_AMBULATORY_CARE_PROVIDER_SITE_OTHER): Payer: BC Managed Care – PPO | Admitting: Family Medicine

## 2015-06-25 ENCOUNTER — Encounter: Payer: Self-pay | Admitting: Family Medicine

## 2015-06-25 VITALS — BP 126/84 | HR 89 | Temp 98.0°F | Wt 210.0 lb

## 2015-06-25 DIAGNOSIS — M25561 Pain in right knee: Secondary | ICD-10-CM

## 2015-06-25 DIAGNOSIS — A6002 Herpesviral infection of other male genital organs: Secondary | ICD-10-CM

## 2015-06-25 DIAGNOSIS — R21 Rash and other nonspecific skin eruption: Secondary | ICD-10-CM

## 2015-06-25 DIAGNOSIS — F319 Bipolar disorder, unspecified: Secondary | ICD-10-CM | POA: Diagnosis not present

## 2015-06-25 DIAGNOSIS — A609 Anogenital herpesviral infection, unspecified: Secondary | ICD-10-CM

## 2015-06-25 MED ORDER — LITHIUM CARBONATE ER 300 MG PO TBCR: 600.0000 mg | EXTENDED_RELEASE_TABLET | Freq: Two times a day (BID) | ORAL | Status: DC

## 2015-06-25 MED ORDER — FLUCONAZOLE 150 MG PO TABS: 150.0000 mg | ORAL_TABLET | ORAL | Status: DC

## 2015-06-25 MED ORDER — VALACYCLOVIR HCL 500 MG PO TABS: 500.0000 mg | ORAL_TABLET | Freq: Two times a day (BID) | ORAL | Status: DC

## 2015-06-25 MED ORDER — CLOTRIMAZOLE 1 % EX CREA: 1.0000 "application " | TOPICAL_CREAM | Freq: Two times a day (BID) | CUTANEOUS | Status: DC

## 2015-06-25 NOTE — Patient Instructions (Addendum)
Flu shot today Blood work today Meds refilled today Valtrex sent to pharmacy Letter written today. For rash under arms - treat with oral antifungal sent to pharmacy, use clotrimazole to skin. Possible ACL irritation. Use brace to right knee as needed. Rest knee, elevate, ice as needed. If worsening let us know for ortho referral ([facility] [place] [facility]).

## 2015-06-25 NOTE — Progress Notes (Signed)
BP 126/84 mmHg  Pulse 89  Temp(Src) 98 F (36.7 C) (Oral)  Wt 210 lb (95.255 kg)  SpO2 98%   CC: mult issues  Subjective:    Patient ID: Erik Smith, male    DOB: Jan 04, 1966, 50 y.o.   MRN: [id number]  HPI: Erik Smith is a 50 y.o. male presenting on 06/25/2015 for Rash; Knee Pain; genital herpes; and Medication Refill   Rash under arms - present for weeks to months. Wonders if contact dermatitis to deodorant. Some spits distal to axilla on upper arm as well. Stopped deodorant and may have improved some.   Genital herpes - new dx. 2nd outbreak over the past 6 months. GF dx with this as well. Requests rx for flares. Currently towards end of flare.   Bipolar disorder - on lithium CR 600mg  BID. Last seen here for bipolar 10/2013. No recent labs. Saw psychiatrist but we started following after stable med. Due for labwork  Remote R knee injury playing with children (around 2004). Active in taekwondo - worsening knee pain with this. Main concern is when he does L knee kicks with R knee planted. Needs doctor's note that he cannot participate in taekwondo. Never had knee surgery. Has knee brace to use when pain flares. Really enjoys taekwondo and is green belt but feels physically unable to continue this.   Now off gabapentin - prior on this for neck pain.  Relevant past medical, surgical, family and social history reviewed and updated as indicated. Interim medical history since our last visit reviewed. Allergies and medications reviewed and updated. Current Outpatient Prescriptions on File Prior to Visit  Medication Sig  . gabapentin (NEURONTIN) 300 MG capsule Take 1 capsule (300 mg total) by mouth 3 (three) times daily.  . naproxen (NAPROSYN) 500 MG tablet Take 1 tablet (500 mg total) by mouth 2 (two) times daily with a meal. Take one po bid x 1 week then prn pain, take with food   No current facility-administered medications on file prior to visit.    Review of Systems Per HPI unless  specifically indicated in ROS section     Objective:    BP 126/84 mmHg  Pulse 89  Temp(Src) 98 F (36.7 C) (Oral)  Wt 210 lb (95.255 kg)  SpO2 98%  Wt Readings from Last 3 Encounters:  06/25/15 210 lb (95.255 kg)  11/16/14 216 lb (97.977 kg)  11/12/14 216 lb 8 oz (98.204 kg)    Physical Exam  Constitutional: He appears well-developed and well-nourished. No distress.  Genitourinary: Penis normal.  Dried lesion with scab on dorsal glans of penis with minimal surrounding erythema  Musculoskeletal: He exhibits no edema.  L knee WNL R Knee exam: No deformity on inspection. No pain with palpation of knee landmarks. No effusion/swelling noted. FROM in flex/extension without crepitus. No popliteal fullness. ++ tender with testing ACL without obvious laxity Neg mcmurray test. No pain with valgus/varus stress. No PFgrind. No abnormal patellar mobility.   Skin: Skin is warm and dry. Rash noted. There is erythema.  Erythematous patchy scaling rash bilateral axilla with clearing erythematous rash distal inner upper arms  Psychiatric: He has a normal mood and affect.  Nursing note and vitals reviewed.  Results for orders placed or performed in visit on 06/25/15  Lithium level  Result Value Ref Range   Lithium Lvl <0.10 (L) 0.80 - 1.40 mEq/L  TSH  Result Value Ref Range   TSH 1.39 0.40 - 4.50 mIU/L  Basic Metabolic Panel  Result Value Ref Range   Sodium 141 135 - 146 mmol/L   Potassium 4.3 3.5 - 5.3 mmol/L   Chloride 105 98 - 110 mmol/L   CO2 26 20 - 31 mmol/L   Glucose, Bld 93 65 - 99 mg/dL   BUN 21 7 - 25 mg/dL   Creat 1.00 0.70 - 1.33 mg/dL   Calcium 9.5 8.6 - 10.3 mg/dL      Assessment & Plan:  RTC 6 mo CPE Problem List Items Addressed This Visit    Skin rash    ?candidal intertrigo with satellite lesions - will treat with diflucan 150mg  weekly x3 wks and topical clotrimazole - update with effect.       Right knee pain - Primary    Concern for remote ACL injury  that is now exacerbated with increased stress from higher taekwondo exertion. No obvious tear appreciated today on exam. Will provide letter for pt to be able to get out of his 68 month taekwondo contract.  Rec rest, elevation of leg, knee brace use and to update [place] if worsening sxs for referral to ortho - declines ortho referral at this time.      Genital herpes in men    GF with dx, now pt with 2 outbreaks over the past year.  Discussed with patient.  Discussed valtrex use - will prescribe 500mg  bid x 3d each flare #30.       Relevant Medications   fluconazole (DIFLUCAN) 150 MG tablet   clotrimazole (LOTRIMIN) 1 % cream   valACYclovir (VALTREX) 500 MG tablet   Bipolar disorder, unspecified ([place])    Reports compliance with lithium, very stable on this medication. No recent psychiatric evaluation. Check labs today, refilled.       Relevant Orders   Lithium level (Completed)   TSH (Completed)   Basic Metabolic Panel (Completed)       Follow up plan: Return in about 6 months (around 12/23/2015), or as needed, for annual exam, prior fasting for blood work.

## 2015-06-25 NOTE — Progress Notes (Signed)
Pre visit review using our clinic review tool, if applicable. No additional management support is needed unless otherwise documented below in the visit note. 

## 2015-06-26 ENCOUNTER — Telehealth: Payer: Self-pay | Admitting: Family Medicine

## 2015-06-26 DIAGNOSIS — M25561 Pain in right knee: Secondary | ICD-10-CM | POA: Insufficient documentation

## 2015-06-26 DIAGNOSIS — R21 Rash and other nonspecific skin eruption: Secondary | ICD-10-CM | POA: Insufficient documentation

## 2015-06-26 DIAGNOSIS — A6000 Herpesviral infection of urogenital system, unspecified: Secondary | ICD-10-CM | POA: Insufficient documentation

## 2015-06-26 LAB — LITHIUM LEVEL: Lithium Lvl: <0.1 mEq/L — ABNORMAL LOW (ref 0.80–1.40)

## 2015-06-26 LAB — BASIC METABOLIC PANEL
BUN: 21 mg/dL (ref 7–25)
CO2: 26 mmol/L (ref 20–31)
Calcium: 9.5 mg/dL (ref 8.6–10.3)
Chloride: 105 mmol/L (ref 98–110)
Creat: 1 mg/dL (ref 0.70–1.33)
Glucose, Bld: 93 mg/dL (ref 65–99)
Potassium: 4.3 mmol/L (ref 3.5–5.3)
Sodium: 141 mmol/L (ref 135–146)

## 2015-06-26 LAB — TSH: TSH: 1.39 mIU/L (ref 0.40–4.50)

## 2015-06-26 NOTE — Telephone Encounter (Signed)
plz notify - I forgot to provide letter for taekwondo to patient while at office visit. In chart, plz mail or have him drop by to pick up. thanks

## 2015-06-26 NOTE — Assessment & Plan Note (Signed)
Reports compliance with lithium, very stable on this medication. No recent psychiatric evaluation. Check labs today, refilled.

## 2015-06-26 NOTE — Assessment & Plan Note (Signed)
Concern for remote ACL injury that is now exacerbated with increased stress from higher taekwondo exertion. No obvious tear appreciated today on exam. 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  Rec rest, elevation of leg, knee brace use and to update [place] if worsening sxs for referral to ortho - declines ortho referral at this time.

## 2015-06-26 NOTE — Assessment & Plan Note (Addendum)
?  candidal intertrigo with satellite lesions - will treat with diflucan 150mg  weekly x3 wks and topical clotrimazole - update with effect.

## 2015-06-26 NOTE — Assessment & Plan Note (Signed)
GF with dx, now pt with 2 outbreaks over the past year.  Discussed with patient.  Discussed valtrex use - will prescribe 500mg  bid x 3d each flare #30.

## 2015-06-27 ENCOUNTER — Encounter: Payer: Self-pay | Admitting: Family Medicine

## 2015-06-27 NOTE — Telephone Encounter (Signed)
See pt e-mail

## 2015-06-28 NOTE — Telephone Encounter (Signed)
Patient notified. Letter placed up front for pick up.  

## 2015-07-19 ENCOUNTER — Telehealth: Payer: Self-pay | Admitting: Family Medicine

## 2015-07-19 NOTE — Telephone Encounter (Signed)
Pt dropped off form needed to be filled out in order to get out of [employer]ae [employer]  Please fill out and call when ready to pick up  Thanks  cb number is [phone] Placing in rx tower

## 2015-07-19 NOTE — Telephone Encounter (Signed)
In your IN box for completion.  

## 2015-07-23 NOTE — Telephone Encounter (Signed)
filled out - will see if Dr Damita Dunnings can take into office on Monday.

## 2015-07-26 NOTE — Telephone Encounter (Signed)
Form faxed. Message left advising patient.

## 2015-10-20 DIAGNOSIS — C801 Malignant (primary) neoplasm, unspecified: Secondary | ICD-10-CM

## 2015-10-20 HISTORY — DX: Malignant (primary) neoplasm, unspecified: C80.1

## 2016-05-24 ENCOUNTER — Ambulatory Visit: Payer: BC Managed Care – PPO | Admitting: Psychology

## 2016-06-14 ENCOUNTER — Ambulatory Visit (INDEPENDENT_AMBULATORY_CARE_PROVIDER_SITE_OTHER): Payer: BC Managed Care – PPO | Admitting: Psychology

## 2016-06-14 DIAGNOSIS — F4322 Adjustment disorder with anxiety: Secondary | ICD-10-CM

## 2016-06-14 DIAGNOSIS — F3111 Bipolar disorder, current episode manic without psychotic features, mild: Secondary | ICD-10-CM

## 2016-07-05 ENCOUNTER — Ambulatory Visit (INDEPENDENT_AMBULATORY_CARE_PROVIDER_SITE_OTHER): Payer: BC Managed Care – PPO | Admitting: Psychology

## 2016-07-05 DIAGNOSIS — F4322 Adjustment disorder with anxiety: Secondary | ICD-10-CM | POA: Diagnosis not present

## 2016-07-05 DIAGNOSIS — F3173 Bipolar disorder, in partial remission, most recent episode manic: Secondary | ICD-10-CM

## 2016-07-26 ENCOUNTER — Ambulatory Visit (INDEPENDENT_AMBULATORY_CARE_PROVIDER_SITE_OTHER): Payer: BC Managed Care – PPO | Admitting: Psychology

## 2016-07-26 ENCOUNTER — Other Ambulatory Visit: Payer: Self-pay | Admitting: Family Medicine

## 2016-07-26 DIAGNOSIS — F3112 Bipolar disorder, current episode manic without psychotic features, moderate: Secondary | ICD-10-CM | POA: Diagnosis not present

## 2016-07-27 MED ORDER — VALACYCLOVIR HCL 500 MG PO TABS: 500.0000 mg | ORAL_TABLET | Freq: Two times a day (BID) | ORAL | 0 refills | Status: DC

## 2016-08-30 ENCOUNTER — Ambulatory Visit (INDEPENDENT_AMBULATORY_CARE_PROVIDER_SITE_OTHER): Payer: BC Managed Care – PPO | Admitting: Psychology

## 2016-09-20 ENCOUNTER — Ambulatory Visit (INDEPENDENT_AMBULATORY_CARE_PROVIDER_SITE_OTHER): Payer: BC Managed Care – PPO | Admitting: Psychology

## 2016-09-20 DIAGNOSIS — F3112 Bipolar disorder, current episode manic without psychotic features, moderate: Secondary | ICD-10-CM

## 2016-09-27 ENCOUNTER — Ambulatory Visit (INDEPENDENT_AMBULATORY_CARE_PROVIDER_SITE_OTHER): Payer: BC Managed Care – PPO | Admitting: Psychology

## 2016-09-27 DIAGNOSIS — F3112 Bipolar disorder, current episode manic without psychotic features, moderate: Secondary | ICD-10-CM

## 2016-09-27 DIAGNOSIS — F4323 Adjustment disorder with mixed anxiety and depressed mood: Secondary | ICD-10-CM

## 2016-10-23 ENCOUNTER — Ambulatory Visit (INDEPENDENT_AMBULATORY_CARE_PROVIDER_SITE_OTHER): Payer: BC Managed Care – PPO | Admitting: Psychology

## 2016-10-30 ENCOUNTER — Ambulatory Visit (INDEPENDENT_AMBULATORY_CARE_PROVIDER_SITE_OTHER): Payer: BC Managed Care – PPO | Admitting: Psychology

## 2016-10-30 DIAGNOSIS — F312 Bipolar disorder, current episode manic severe with psychotic features: Secondary | ICD-10-CM

## 2016-10-30 DIAGNOSIS — F4323 Adjustment disorder with mixed anxiety and depressed mood: Secondary | ICD-10-CM

## 2016-11-27 ENCOUNTER — Ambulatory Visit (INDEPENDENT_AMBULATORY_CARE_PROVIDER_SITE_OTHER): Payer: BC Managed Care – PPO | Admitting: Psychology

## 2016-11-27 DIAGNOSIS — F3112 Bipolar disorder, current episode manic without psychotic features, moderate: Secondary | ICD-10-CM | POA: Diagnosis not present

## 2016-12-13 ENCOUNTER — Other Ambulatory Visit: Payer: Self-pay | Admitting: Family Medicine

## 2016-12-13 ENCOUNTER — Encounter: Payer: BC Managed Care – PPO | Admitting: Family Medicine

## 2016-12-13 DIAGNOSIS — Z0000 Encounter for general adult medical examination without abnormal findings: Secondary | ICD-10-CM

## 2016-12-13 DIAGNOSIS — Z0001 Encounter for general adult medical examination with abnormal findings: Secondary | ICD-10-CM | POA: Diagnosis not present

## 2016-12-13 DIAGNOSIS — Z125 Encounter for screening for malignant neoplasm of prostate: Secondary | ICD-10-CM

## 2016-12-13 DIAGNOSIS — Z1211 Encounter for screening for malignant neoplasm of colon: Secondary | ICD-10-CM

## 2016-12-13 DIAGNOSIS — F3176 Bipolar disorder, in full remission, most recent episode depressed: Secondary | ICD-10-CM

## 2016-12-13 DIAGNOSIS — H73891 Other specified disorders of tympanic membrane, right ear: Secondary | ICD-10-CM | POA: Diagnosis not present

## 2016-12-14 ENCOUNTER — Other Ambulatory Visit (INDEPENDENT_AMBULATORY_CARE_PROVIDER_SITE_OTHER): Payer: BC Managed Care – PPO

## 2016-12-14 DIAGNOSIS — Z125 Encounter for screening for malignant neoplasm of prostate: Secondary | ICD-10-CM | POA: Diagnosis not present

## 2016-12-14 DIAGNOSIS — Z0000 Encounter for general adult medical examination without abnormal findings: Secondary | ICD-10-CM

## 2016-12-14 DIAGNOSIS — F3176 Bipolar disorder, in full remission, most recent episode depressed: Secondary | ICD-10-CM

## 2016-12-14 LAB — CBC WITH DIFFERENTIAL/PLATELET
Basophils Absolute: 0 10*3/uL (ref 0.0–0.1)
Basophils Relative: 0.6 % (ref 0.0–3.0)
Eosinophils Absolute: 0.1 10*3/uL (ref 0.0–0.7)
Eosinophils Relative: 2.1 % (ref 0.0–5.0)
HCT: 45.4 % (ref 39.0–52.0)
Hemoglobin: 15.2 g/dL (ref 13.0–17.0)
Lymphocytes Relative: 31.9 % (ref 12.0–46.0)
Lymphs Abs: 2 10*3/uL (ref 0.7–4.0)
MCHC: 33.5 g/dL (ref 30.0–36.0)
MCV: 89 fl (ref 78.0–100.0)
Monocytes Absolute: 0.6 10*3/uL (ref 0.1–1.0)
Monocytes Relative: 9.2 % (ref 3.0–12.0)
Neutro Abs: 3.6 10*3/uL (ref 1.4–7.7)
Neutrophils Relative %: 56.2 % (ref 43.0–77.0)
Platelets: 248 10*3/uL (ref 150.0–400.0)
RBC: 5.1 Mil/uL (ref 4.22–5.81)
RDW: 13.6 % (ref 11.5–15.5)
WBC: 6.4 10*3/uL (ref 4.0–10.5)

## 2016-12-14 LAB — COMPREHENSIVE METABOLIC PANEL
ALT: 18 U/L (ref 0–53)
AST: 15 U/L (ref 0–37)
Albumin: 4.6 g/dL (ref 3.5–5.2)
Alkaline Phosphatase: 70 U/L (ref 39–117)
BUN: 15 mg/dL (ref 6–23)
CO2: 27 mEq/L (ref 19–32)
Calcium: 9.3 mg/dL (ref 8.4–10.5)
Chloride: 105 mEq/L (ref 96–112)
Creatinine, Ser: 0.95 mg/dL (ref 0.40–1.50)
GFR: 88.31 mL/min (ref >60.00)
Glucose, Bld: 74 mg/dL (ref 70–99)
Potassium: 4.6 mEq/L (ref 3.5–5.1)
Sodium: 140 mEq/L (ref 135–145)
Total Bilirubin: 0.6 mg/dL (ref 0.2–1.2)
Total Protein: 6.9 g/dL (ref 6.0–8.3)

## 2016-12-14 LAB — LIPID PANEL
Cholesterol: 182 mg/dL (ref 0–200)
HDL: 59.7 mg/dL (ref >39.00)
LDL Cholesterol: 110 mg/dL — ABNORMAL HIGH (ref 0–99)
NonHDL: 122.66
Total CHOL/HDL Ratio: 3
Triglycerides: 62 mg/dL (ref 0.0–149.0)
VLDL: 12.4 mg/dL (ref 0.0–40.0)

## 2016-12-14 LAB — TSH: TSH: 1.35 u[IU]/mL (ref 0.35–4.50)

## 2016-12-14 LAB — PSA: PSA: 0.16 ng/mL (ref 0.10–4.00)

## 2016-12-15 LAB — LITHIUM LEVEL: Lithium Lvl: <0.1 mmol/L — ABNORMAL LOW (ref 0.6–1.2)

## 2016-12-21 ENCOUNTER — Other Ambulatory Visit (INDEPENDENT_AMBULATORY_CARE_PROVIDER_SITE_OTHER): Payer: BC Managed Care – PPO

## 2016-12-21 DIAGNOSIS — Z0000 Encounter for general adult medical examination without abnormal findings: Secondary | ICD-10-CM

## 2016-12-21 DIAGNOSIS — Z1211 Encounter for screening for malignant neoplasm of colon: Secondary | ICD-10-CM | POA: Diagnosis not present

## 2016-12-21 LAB — FECAL OCCULT BLOOD, IMMUNOCHEMICAL: Fecal Occult Bld: NEGATIVE

## 2016-12-22 ENCOUNTER — Encounter: Payer: Self-pay | Admitting: *Deleted

## 2017-05-25 ENCOUNTER — Other Ambulatory Visit: Payer: Self-pay | Admitting: Orthopedic Surgery

## 2017-08-08 ENCOUNTER — Ambulatory Visit (INDEPENDENT_AMBULATORY_CARE_PROVIDER_SITE_OTHER): Payer: BC Managed Care – PPO | Admitting: Psychology

## 2017-08-08 DIAGNOSIS — F3112 Bipolar disorder, current episode manic without psychotic features, moderate: Secondary | ICD-10-CM | POA: Diagnosis not present

## 2017-08-08 DIAGNOSIS — F4323 Adjustment disorder with mixed anxiety and depressed mood: Secondary | ICD-10-CM

## 2017-08-14 ENCOUNTER — Other Ambulatory Visit: Payer: Self-pay | Admitting: Family Medicine

## 2017-08-14 ENCOUNTER — Other Ambulatory Visit: Payer: Self-pay

## 2017-08-14 ENCOUNTER — Encounter: Payer: Self-pay | Admitting: Family Medicine

## 2017-08-14 NOTE — Telephone Encounter (Signed)
Last rx:  06/25/15, #120 Last OV:  06/25/15 Next OV:  none

## 2017-08-14 NOTE — Telephone Encounter (Signed)
Last rx:  07/27/16, #30 Last OV:  11/16/14 Next OV:  None  Pt requests rx be sent to [facility]

## 2017-08-15 MED ORDER — VALACYCLOVIR HCL 500 MG PO TABS: 500.0000 mg | ORAL_TABLET | Freq: Two times a day (BID) | ORAL | 0 refills | Status: DC

## 2017-08-15 MED ORDER — LITHIUM CARBONATE ER 300 MG PO TBCR: 600.0000 mg | EXTENDED_RELEASE_TABLET | Freq: Two times a day (BID) | ORAL | 2 refills | Status: DC

## 2017-08-15 NOTE — Telephone Encounter (Signed)
Sent in - will need OV prior to more refills as overdue.  plz call and schedule appt

## 2017-08-16 NOTE — Telephone Encounter (Signed)
Spoke with pt notifying him refill was sent to pharmacy.  Also, informed pt he is overdue for OV and will need one for further refills.  Says he will have to call back to schedule.

## 2017-09-25 ENCOUNTER — Ambulatory Visit (INDEPENDENT_AMBULATORY_CARE_PROVIDER_SITE_OTHER): Payer: BC Managed Care – PPO | Admitting: Psychology

## 2017-09-25 DIAGNOSIS — F3112 Bipolar disorder, current episode manic without psychotic features, moderate: Secondary | ICD-10-CM | POA: Diagnosis not present

## 2017-09-25 DIAGNOSIS — F4323 Adjustment disorder with mixed anxiety and depressed mood: Secondary | ICD-10-CM | POA: Diagnosis not present

## 2017-10-08 ENCOUNTER — Other Ambulatory Visit: Payer: Self-pay

## 2017-10-08 ENCOUNTER — Encounter (HOSPITAL_BASED_OUTPATIENT_CLINIC_OR_DEPARTMENT_OTHER): Payer: Self-pay | Admitting: *Deleted

## 2017-10-18 ENCOUNTER — Ambulatory Visit (HOSPITAL_BASED_OUTPATIENT_CLINIC_OR_DEPARTMENT_OTHER)
Admission: RE | Admit: 2017-10-18 | Discharge: 2017-10-18 | Disposition: A | Discharge status: Home / Self Care | Payer: BC Managed Care – PPO | Source: Ambulatory Visit | Attending: Orthopedic Surgery | Admitting: Orthopedic Surgery

## 2017-10-18 ENCOUNTER — Other Ambulatory Visit: Payer: Self-pay

## 2017-10-18 ENCOUNTER — Ambulatory Visit (HOSPITAL_BASED_OUTPATIENT_CLINIC_OR_DEPARTMENT_OTHER): Payer: BC Managed Care – PPO | Admitting: Anesthesiology

## 2017-10-18 ENCOUNTER — Encounter (HOSPITAL_BASED_OUTPATIENT_CLINIC_OR_DEPARTMENT_OTHER)
Admission: RE | Disposition: A | Discharge status: Home / Self Care | Payer: Self-pay | Source: Ambulatory Visit | Attending: Orthopedic Surgery

## 2017-10-18 ENCOUNTER — Encounter (HOSPITAL_BASED_OUTPATIENT_CLINIC_OR_DEPARTMENT_OTHER): Payer: Self-pay

## 2017-10-18 DIAGNOSIS — J45909 Unspecified asthma, uncomplicated: Secondary | ICD-10-CM | POA: Diagnosis not present

## 2017-10-18 DIAGNOSIS — M13842 Other specified arthritis, left hand: Secondary | ICD-10-CM | POA: Insufficient documentation

## 2017-10-18 DIAGNOSIS — Z885 Allergy status to narcotic agent status: Secondary | ICD-10-CM | POA: Insufficient documentation

## 2017-10-18 DIAGNOSIS — Z79899 Other long term (current) drug therapy: Secondary | ICD-10-CM | POA: Diagnosis not present

## 2017-10-18 HISTORY — PX: CARPOMETACARPEL SUSPENSION PLASTY: SHX5005

## 2017-10-18 HISTORY — DX: Unspecified asthma, uncomplicated: J45.909

## 2017-10-18 SURGERY — CARPOMETACARPEL (CMC) SUSPENSION PLASTY
Anesthesia: General | Site: Hand | Laterality: Left

## 2017-10-18 MED ORDER — MIDAZOLAM HCL 2 MG/2ML IJ SOLN
1.0000 mg | INTRAMUSCULAR | Status: DC | PRN
  Administered 2017-10-18: 2 mg via INTRAVENOUS

## 2017-10-18 MED ORDER — HYDROCODONE-ACETAMINOPHEN 5-325 MG PO TABS
ORAL_TABLET | ORAL | Status: AC
  Filled 2017-10-18: qty 1

## 2017-10-18 MED ORDER — SUCCINYLCHOLINE CHLORIDE 200 MG/10ML IV SOSY
PREFILLED_SYRINGE | INTRAVENOUS | Status: AC
  Filled 2017-10-18: qty 10

## 2017-10-18 MED ORDER — CEFAZOLIN SODIUM-DEXTROSE 2-4 GM/100ML-% IV SOLN
INTRAVENOUS | Status: AC
  Filled 2017-10-18: qty 100

## 2017-10-18 MED ORDER — HYDROCODONE-ACETAMINOPHEN 5-325 MG PO TABS: ORAL_TABLET | ORAL | 0 refills | Status: DC

## 2017-10-18 MED ORDER — LIDOCAINE HCL (CARDIAC) PF 100 MG/5ML IV SOSY
PREFILLED_SYRINGE | INTRAVENOUS | Status: AC
  Filled 2017-10-18: qty 5

## 2017-10-18 MED ORDER — FENTANYL CITRATE (PF) 100 MCG/2ML IJ SOLN
INTRAMUSCULAR | Status: AC
  Filled 2017-10-18: qty 2

## 2017-10-18 MED ORDER — ONDANSETRON HCL 4 MG/2ML IJ SOLN
INTRAMUSCULAR | Status: DC | PRN
  Administered 2017-10-18: 4 mg via INTRAVENOUS

## 2017-10-18 MED ORDER — LIDOCAINE HCL (CARDIAC) PF 100 MG/5ML IV SOSY
PREFILLED_SYRINGE | INTRAVENOUS | Status: DC | PRN
  Administered 2017-10-18: 100 mg via INTRAVENOUS

## 2017-10-18 MED ORDER — FENTANYL CITRATE (PF) 100 MCG/2ML IJ SOLN
50.0000 ug | INTRAMUSCULAR | Status: AC | PRN
  Administered 2017-10-18 (×4): 50 ug via INTRAVENOUS
  Administered 2017-10-18: 100 ug via INTRAVENOUS

## 2017-10-18 MED ORDER — PROPOFOL 500 MG/50ML IV EMUL
INTRAVENOUS | Status: AC
  Filled 2017-10-18: qty 50

## 2017-10-18 MED ORDER — BUPIVACAINE HCL (PF) 0.25 % IJ SOLN
INTRAMUSCULAR | Status: AC
  Filled 2017-10-18: qty 30

## 2017-10-18 MED ORDER — SCOPOLAMINE 1 MG/3DAYS TD PT72: 1.0000 | MEDICATED_PATCH | TRANSDERMAL | Status: DC

## 2017-10-18 MED ORDER — ONDANSETRON HCL 4 MG/2ML IJ SOLN
INTRAMUSCULAR | Status: AC
  Filled 2017-10-18: qty 2

## 2017-10-18 MED ORDER — MIDAZOLAM HCL 2 MG/2ML IJ SOLN
INTRAMUSCULAR | Status: AC
  Filled 2017-10-18: qty 2

## 2017-10-18 MED ORDER — FENTANYL CITRATE (PF) 100 MCG/2ML IJ SOLN
INTRAMUSCULAR | Status: AC
Start: 2017-10-18 — End: ?
  Filled 2017-10-18: qty 2

## 2017-10-18 MED ORDER — SCOPOLAMINE 1 MG/3DAYS TD PT72: 1.0000 | MEDICATED_PATCH | Freq: Once | TRANSDERMAL | Status: DC | PRN

## 2017-10-18 MED ORDER — PROPOFOL 10 MG/ML IV BOLUS
INTRAVENOUS | Status: DC | PRN
  Administered 2017-10-18: 200 mg via INTRAVENOUS

## 2017-10-18 MED ORDER — CHLORHEXIDINE GLUCONATE 4 % EX LIQD: 60.0000 mL | Freq: Once | CUTANEOUS | Status: DC

## 2017-10-18 MED ORDER — FENTANYL CITRATE (PF) 100 MCG/2ML IJ SOLN
25.0000 ug | INTRAMUSCULAR | Status: DC | PRN
  Administered 2017-10-18: 50 ug via INTRAVENOUS
  Administered 2017-10-18: 25 ug via INTRAVENOUS

## 2017-10-18 MED ORDER — BUPIVACAINE HCL (PF) 0.25 % IJ SOLN
INTRAMUSCULAR | Status: DC | PRN
  Administered 2017-10-18: 10 mL

## 2017-10-18 MED ORDER — HYDROCODONE-ACETAMINOPHEN 5-325 MG PO TABS
1.0000 | ORAL_TABLET | Freq: Once | ORAL | Status: AC
  Administered 2017-10-18: 1 via ORAL

## 2017-10-18 MED ORDER — PROMETHAZINE HCL 25 MG/ML IJ SOLN: 6.2500 mg | INTRAMUSCULAR | Status: DC | PRN

## 2017-10-18 MED ORDER — DEXAMETHASONE SODIUM PHOSPHATE 10 MG/ML IJ SOLN
INTRAMUSCULAR | Status: AC
  Filled 2017-10-18: qty 1

## 2017-10-18 MED ORDER — PHENYLEPHRINE 40 MCG/ML (10ML) SYRINGE FOR IV PUSH (FOR BLOOD PRESSURE SUPPORT)
PREFILLED_SYRINGE | INTRAVENOUS | Status: AC
  Filled 2017-10-18: qty 10

## 2017-10-18 MED ORDER — LACTATED RINGERS IV SOLN
INTRAVENOUS | Status: DC
  Administered 2017-10-18: 08:00:00 via INTRAVENOUS

## 2017-10-18 MED ORDER — HYDROMORPHONE HCL 1 MG/ML IJ SOLN
0.5000 mg | Freq: Once | INTRAMUSCULAR | Status: AC | PRN
  Administered 2017-10-18: 0.5 mg via INTRAVENOUS

## 2017-10-18 MED ORDER — CEFAZOLIN SODIUM-DEXTROSE 2-4 GM/100ML-% IV SOLN
2.0000 g | INTRAVENOUS | Status: AC
  Administered 2017-10-18: 2 g via INTRAVENOUS

## 2017-10-18 MED ORDER — HYDROMORPHONE HCL 1 MG/ML IJ SOLN
INTRAMUSCULAR | Status: AC
  Filled 2017-10-18: qty 0.5

## 2017-10-18 MED ORDER — DEXAMETHASONE SODIUM PHOSPHATE 10 MG/ML IJ SOLN
INTRAMUSCULAR | Status: DC | PRN
  Administered 2017-10-18: 10 mg via INTRAVENOUS

## 2017-10-18 MED ORDER — EPHEDRINE SULFATE 50 MG/ML IJ SOLN
INTRAMUSCULAR | Status: AC
  Filled 2017-10-18: qty 1

## 2017-10-18 SURGICAL SUPPLY — 68 items
BANDAGE ACE 3X5.8 VEL STRL LF (GAUZE/BANDAGES/DRESSINGS) ×2 IMPLANT
BLADE MINI RND TIP GREEN BEAV (BLADE) ×2 IMPLANT
BLADE SURG 15 STRL LF DISP TIS (BLADE) ×2 IMPLANT
BLADE SURG 15 STRL SS (BLADE) ×2
BNDG ELASTIC 2X5.8 VLCR STR LF (GAUZE/BANDAGES/DRESSINGS) IMPLANT
BNDG ESMARK 4X9 LF (GAUZE/BANDAGES/DRESSINGS) ×2 IMPLANT
BNDG GAUZE ELAST 4 BULKY (GAUZE/BANDAGES/DRESSINGS) ×2 IMPLANT
CHLORAPREP W/TINT 26ML (MISCELLANEOUS) ×2 IMPLANT
CORD BIPOLAR FORCEPS 12FT (ELECTRODE) ×2 IMPLANT
COVER BACK TABLE 60X90IN (DRAPES) ×2 IMPLANT
COVER MAYO STAND STRL (DRAPES) ×2 IMPLANT
CUFF TOURNIQUET SINGLE 18IN (TOURNIQUET CUFF) ×2 IMPLANT
DECANTER SPIKE VIAL GLASS SM (MISCELLANEOUS) ×2 IMPLANT
DRAPE EXTREMITY T 121X128X90 (DRAPE) ×2 IMPLANT
DRAPE OEC MINIVIEW 54X84 (DRAPES) ×2 IMPLANT
DRAPE SURG 17X23 STRL (DRAPES) ×2 IMPLANT
DRSG PAD ABDOMINAL 8X10 ST (GAUZE/BANDAGES/DRESSINGS) IMPLANT
GAUZE SPONGE 4X4 12PLY STRL (GAUZE/BANDAGES/DRESSINGS) ×2 IMPLANT
GAUZE XEROFORM 1X8 LF (GAUZE/BANDAGES/DRESSINGS) ×2 IMPLANT
GLOVE BIO SURGEON STRL SZ7.5 (GLOVE) ×2 IMPLANT
GLOVE BIOGEL PI IND STRL 7.0 (GLOVE) ×2 IMPLANT
GLOVE BIOGEL PI IND STRL 8 (GLOVE) ×1 IMPLANT
GLOVE BIOGEL PI IND STRL 8.5 (GLOVE) ×1 IMPLANT
GLOVE BIOGEL PI INDICATOR 7.0 (GLOVE) ×2
GLOVE BIOGEL PI INDICATOR 8 (GLOVE) ×1
GLOVE BIOGEL PI INDICATOR 8.5 (GLOVE) ×1
GLOVE ECLIPSE 6.5 STRL STRAW (GLOVE) ×2 IMPLANT
GLOVE SURG ORTHO 8.0 STRL STRW (GLOVE) ×2 IMPLANT
GOWN STRL REUS W/ TWL LRG LVL3 (GOWN DISPOSABLE) ×1 IMPLANT
GOWN STRL REUS W/TWL LRG LVL3 (GOWN DISPOSABLE) ×1
GOWN STRL REUS W/TWL XL LVL3 (GOWN DISPOSABLE) ×4 IMPLANT
K-WIRE .035X4 (WIRE) IMPLANT
NDL SAFETY ECLIPSE 18X1.5 (NEEDLE) IMPLANT
NEEDLE HYPO 18GX1.5 SHARP (NEEDLE)
NEEDLE HYPO 22GX1.5 SAFETY (NEEDLE) IMPLANT
NEEDLE HYPO 25X1 1.5 SAFETY (NEEDLE) ×2 IMPLANT
NEEDLE KEITH (NEEDLE) IMPLANT
NS IRRIG 1000ML POUR BTL (IV SOLUTION) ×2 IMPLANT
PACK BASIN DAY SURGERY FS (CUSTOM PROCEDURE TRAY) ×2 IMPLANT
PAD CAST 3X4 CTTN HI CHSV (CAST SUPPLIES) ×1 IMPLANT
PAD CAST 4YDX4 CTTN HI CHSV (CAST SUPPLIES) IMPLANT
PADDING CAST ABS 4INX4YD NS (CAST SUPPLIES) ×1
PADDING CAST ABS COTTON 4X4 ST (CAST SUPPLIES) ×1 IMPLANT
PADDING CAST COTTON 3X4 STRL (CAST SUPPLIES) ×1
PADDING CAST COTTON 4X4 STRL (CAST SUPPLIES)
PASSER SUT SWANSON 36MM LOOP (INSTRUMENTS) IMPLANT
SLEEVE SCD COMPRESS KNEE MED (MISCELLANEOUS) ×2 IMPLANT
SPLINT FAST PLASTER 5X30 (CAST SUPPLIES)
SPLINT PLASTER CAST FAST 5X30 (CAST SUPPLIES) IMPLANT
SPLINT PLASTER CAST XFAST 4X15 (CAST SUPPLIES) IMPLANT
SPLINT PLASTER XTRA FAST SET 4 (CAST SUPPLIES)
STOCKINETTE 4X48 STRL (DRAPES) ×2 IMPLANT
SUT ETHIBOND 3-0 V-5 (SUTURE) IMPLANT
SUT ETHILON 3 0 PS 1 (SUTURE) IMPLANT
SUT ETHILON 4 0 PS 2 18 (SUTURE) ×2 IMPLANT
SUT FIBERWIRE 2-0 18 17.9 3/8 (SUTURE) ×2
SUT MERSILENE 2.0 SH NDLE (SUTURE) ×2 IMPLANT
SUT MERSILENE 4 0 P 3 (SUTURE) IMPLANT
SUT SILK 4 0 PS 2 (SUTURE) IMPLANT
SUT STEEL 3 0 (SUTURE) ×2 IMPLANT
SUT VIC AB 0 SH 27 (SUTURE) IMPLANT
SUT VICRYL 4-0 PS2 18IN ABS (SUTURE) ×2 IMPLANT
SUTURE FIBERWR 2-0 18 17.9 3/8 (SUTURE) ×1 IMPLANT
SYR BULB 3OZ (MISCELLANEOUS) ×2 IMPLANT
SYR CONTROL 10ML LL (SYRINGE) ×2 IMPLANT
TOWEL GREEN STERILE FF (TOWEL DISPOSABLE) ×4 IMPLANT
TOWEL OR NON WOVEN STRL DISP B (DISPOSABLE) ×2 IMPLANT
UNDERPAD 30X30 (UNDERPADS AND DIAPERS) ×2 IMPLANT

## 2017-10-18 NOTE — Op Note (Signed)
I assisted Surgeon(s) and Role:    * Leanora Cover, MD - Primary    Daryll Brod, MD - Assisting on the Procedure(s): LEFT THUMB TRAPEZIECTOMY AND SUSPENSIONPLASTY on 10/18/2017.  I provided assistance on this case as follows: setup approach, removal of the trapezium, harvest of the tendon for transfer, performance of the arthroplasty, closure of the wound and application of the dressing and splint.Electronically signed by: Wynonia Sours, MD Date: 10/18/2017 Time: 10:41 AM

## 2017-10-18 NOTE — Anesthesia Preprocedure Evaluation (Addendum)
Anesthesia Evaluation  Patient identified by MRN, date of birth, ID band Patient awake    Reviewed: Allergy & Precautions, NPO status , Patient's Chart, lab work & pertinent test results  History of Anesthesia Complications Negative for: history of anesthetic complications  Airway Mallampati: II  TM Distance: >3 FB Neck ROM: Full    Dental no notable dental hx. (+) Dental Advisory Given   Pulmonary asthma ,    Pulmonary exam normal        Cardiovascular negative cardio ROS Normal cardiovascular exam     Neuro/Psych  Headaches, PSYCHIATRIC DISORDERS Depression Bipolar Disorder negative psych ROS   GI/Hepatic negative GI ROS, Neg liver ROS,   Endo/Other  negative endocrine ROS  Renal/GU negative Renal ROS  negative genitourinary   Musculoskeletal  (+) Arthritis ,   Abdominal   Peds negative pediatric ROS (+)  Hematology negative hematology ROS (+)   Anesthesia Other Findings   Reproductive/Obstetrics negative OB ROS                            Anesthesia Physical Anesthesia Plan  ASA: II  Anesthesia Plan: General   Post-op Pain Management:    Induction: Intravenous  PONV Risk Score and Plan: 2  Airway Management Planned: LMA  Additional Equipment:   Intra-op Plan:   Post-operative Plan: Extubation in OR  Informed Consent: I have reviewed the patients History and Physical, chart, labs and discussed the procedure including the risks, benefits and alternatives for the proposed anesthesia with the patient or authorized representative who has indicated his/her understanding and acceptance.   Dental advisory given  Plan Discussed with: CRNA and Anesthesiologist  Anesthesia Plan Comments: (Pt refuses block)       Anesthesia Quick Evaluation

## 2017-10-18 NOTE — Op Note (Signed)
NAME: Erik Smith MEDICAL RECORD NO: [id number] DATE OF BIRTH: 1965/08/09 FACILITY: [facility] LOCATION: [facility] PHYSICIAN: Tennis Must, MD   OPERATIVE REPORT   DATE OF PROCEDURE: 10/18/17    PREOPERATIVE DIAGNOSIS:   Left thumb CMC arthritis   POSTOPERATIVE DIAGNOSIS:   Left thumb CMC arthritis   PROCEDURE:   1.  Left thumb trapeziectomy 2.  Left thumb suspension plasty with APL tendon   SURGEON:  Leanora Cover, M.D.   ASSISTANT: none Daryll Brod, MD   ANESTHESIA:  General   INTRAVENOUS FLUIDS:  Per anesthesia flow sheet.   ESTIMATED BLOOD LOSS:  Minimal.   COMPLICATIONS:  None.   SPECIMENS:  none   TOURNIQUET TIME:    Total Tourniquet Time Documented: Upper Arm (Left) - 87 minutes Total: Upper Arm (Left) - 87 minutes    DISPOSITION:  Stable to PACU.   INDICATIONS: 52 year old male with arthritis of left thumb CMC joint.  He is tried nonoperative measures without lasting relief.  He wishes to have a trapezia ectomy with suspensionplasty. Risks, benefits and alternatives of surgery were discussed including the risks of blood loss, infection, damage to nerves, vessels, tendons, ligaments, bone for surgery, need for additional surgery, complications with wound healing, continued pain, nonunion, malunion, stiffness.  He voiced understanding of these risks and elected to proceed.  OPERATIVE COURSE:  After being identified preoperatively by myself,  the patient and I agreed on the procedure and site of the procedure.  The surgical site was marked.  Surgical consent had been signed. He was given IV Ancef as preoperative antibiotic prophylaxis. He was transferred to the operating room and placed on the operating table in supine position with the Left upper extremity on an arm board.  General anesthesia was induced by the anesthesiologist.  Left upper extremity was prepped and draped in normal sterile orthopedic fashion.  A surgical pause was performed between the  surgeons, anesthesia, and operating room staff and all were in agreement as to the patient, procedure, and site of procedure.  Tourniquet at the proximal aspect of the extremity was inflated to 250 mmHg after exsanguination of the arm with an Esmarch bandage.    Incision was made at the dorsal aspect of the thumb over the [facility] joint.  This is carried into the sub-cutaneous tissues by spreading technique.  The interval between the APL and EPB tendons was made.  The radial artery was identified and protected throughout the case.  The capsule was sharply incised.  The trapezium was identified.  It was carefully freed of soft tissue attachments.  It was removed with the Stann Mainland in a piecemeal fashion.  Once it had been completely removed the ulnar most torsion of the APL inserting into bone was selected.  This was then harvested using a wire in a cheese cutter fashion.  Two incisions were made in the forearm for harvesting the tendon.  The tendon was brought into the trapeziectomy site.  A drill hole was made in the base of the thumb metacarpal exiting at the ulnar side of the articular surface.  Additional drill hole was made into the base of the index finger metacarpal.  The tendon slip was then passed through the hole in the base of the thumb metacarpal and into the hole in the base of the index finger metacarpal.  It was then passed around the dorsum of the index finger metacarpal.  It was tied to itself with Ethibond suture.  Good suspension of the thumb  metacarpal was obtained.  C-arm was used in AP and lateral projections to ensure appropriate positioning which was the case.  The wounds were all copiously irrigated with sterile saline.  The capsule was repaired in the thumb with Vicryl suture.  All skin was closed with 4-0 nylon in a horizontal mattress fashion.  The wounds were injected with quarter percent plain Marcaine to aid in postoperative analgesia.  There were then dressed with sterile Xeroform and 4 x 4's  and wrapped with Kerlix bandage.  A thumb spica splint was placed and wrapped with Kerlix and Ace bandage.  The tourniquet was deflated at 87 minutes.  Fingertips were pink with brisk capillary refill after deflation of tourniquet.  The operative  drapes were broken down.  The patient was awoken from anesthesia safely.  He was transferred back to the stretcher and taken to PACU in stable condition.  I will see him back in the office in 1 week for postoperative followup.  I will give him a prescription for Norco 5/325 1-2 tabs PO q6 hours prn pain, dispense # 30.   Tennis Must, MD Electronically signed, 10/18/17

## 2017-10-18 NOTE — Discharge Instructions (Addendum)

## 2017-10-18 NOTE — Transfer of Care (Signed)
Immediate Anesthesia Transfer of Care Note  Patient: Erik Smith  Procedure(s) Performed: LEFT THUMB TRAPEZIECTOMY AND SUSPENSIONPLASTY (Left Hand)  Patient Location: PACU  Anesthesia Type:General  Level of Consciousness: awake, alert , oriented and drowsy  Airway & Oxygen Therapy: Patient Spontanous Breathing and Patient connected to face mask oxygen  Post-op Assessment: Report given to RN and Post -op Vital signs reviewed and stable  Post vital signs: Reviewed and stable  Last Vitals:  Vitals Value Taken Time  BP    Temp    Pulse 80 10/18/2017 10:41 AM  Resp 14 10/18/2017 10:41 AM  SpO2 98 % 10/18/2017 10:41 AM  Vitals shown include unvalidated device data.  Last Pain:  Vitals:   10/18/17 0637  TempSrc:   PainSc: 1       Patients Stated Pain Goal: 1 (24/49/75 3005)  Complications: No apparent anesthesia complications

## 2017-10-18 NOTE — Anesthesia Postprocedure Evaluation (Signed)
Anesthesia Post Note  Patient: Erik Smith  Procedure(s) Performed: LEFT THUMB TRAPEZIECTOMY AND SUSPENSIONPLASTY (Left Hand)     Patient location during evaluation: PACU Anesthesia Type: General Level of consciousness: sedated Pain management: pain level controlled Vital Signs Assessment: post-procedure vital signs reviewed and stable Respiratory status: spontaneous breathing and respiratory function stable Cardiovascular status: stable Postop Assessment: no apparent nausea or vomiting Anesthetic complications: no    Last Vitals:  Vitals:   10/18/17 1145 10/18/17 1200  BP: (!) 121/103 (!) 125/95  Pulse: 77 77  Resp: 15 15  Temp:    SpO2: 92% 94%    Last Pain:  Vitals:   10/18/17 1200  TempSrc:   PainSc: 4                  Syon Tews Daunte

## 2017-10-18 NOTE — Anesthesia Procedure Notes (Signed)
Procedure Name: LMA Insertion Date/Time: 10/18/2017 8:46 AM Performed by: Willa Frater, CRNA Pre-anesthesia Checklist: Patient identified, Emergency Drugs available, Suction available and Patient being monitored Patient Re-evaluated:Patient Re-evaluated prior to induction Oxygen Delivery Method: Circle system utilized Preoxygenation: Pre-oxygenation with 100% oxygen Induction Type: IV induction Ventilation: Mask ventilation without difficulty LMA: LMA inserted LMA Size: 5.0 Number of attempts: 1 Airway Equipment and Method: Bite block Placement Confirmation: positive ETCO2 Tube secured with: Tape Dental Injury: Teeth and Oropharynx as per pre-operative assessment

## 2017-10-18 NOTE — H&P (Signed)
Erik Smith is an 52 y.o. male.   Chief Complaint: left thumb arthritis HPI: 52 yo male with pain at base of left thumb.  Worsened with pinch activities.  He has tried non operative measures without lasting relief.  He wishes to have trapeziectomy with suspensionplasty.  Allergies:  Allergies  Allergen Reactions  . Morphine And Related Itching    Past Medical History:  Diagnosis Date  . Arthritis   . Asthma   . Bipolar disorder, unspecified ([place])    hospitalization 2004 for depression, bipolar, ambien overdose  . Cholesteatoma 05/2010   s/p Smith ear surgery, residual tinnitus, planning on L ear surgery  . Family history of ischemic heart disease   . History of asthma   . Migraine without aura, without mention of intractable migraine without mention of status migrainosus   . Spinal stenosis   . Unspecified adjustment reaction     Past Surgical History:  Procedure Laterality Date  . COLONOSCOPY  1999   normal (in [place])  . ESOPHAGUS SURGERY  2003   torn esophagus  . [place]   for cholesteatoma, bilateral first L then Smith  . NECK SURGERY  2002   spinal stenosis and arthritis  . perianal abscess  1998    Family History: Family History  Problem Relation Age of Onset  . Coronary artery disease Father 70  . Sickle cell anemia Mother   . Asthma Mother        smoker  . Prostate cancer Paternal Grandfather 101       deceased from prostate CA  . Lung cancer Paternal Grandmother 32       smoker  . Breast cancer Paternal Uncle   . Stroke Neg Hx   . Diabetes Neg Hx     Social History:   reports that he has never smoked. He has never used smokeless tobacco. He reports that he drinks alcohol. He reports that he does not use drugs.  Medications: Medications Prior to Admission  Medication Sig Dispense Refill  . doxycycline (DORYX) 100 MG EC tablet Take 100 mg by mouth 2 (two) times daily.    Marland Kitchen ibuprofen (ADVIL,MOTRIN) 200 MG tablet Take 800 mg by  mouth every 6 (six) hours as needed.    . lithium carbonate (LITHOBID) 300 MG CR tablet Take 2 tablets (600 mg total) by mouth 2 (two) times daily. 120 tablet 2  . valACYclovir (VALTREX) 500 MG tablet Take 1 tablet (500 mg total) by mouth 2 (two) times daily. For 3 days at a time 30 tablet 0    No results found for this or any previous visit (from the past 48 hour(s)).  No results found.   A comprehensive review of systems was negative.  Blood pressure (!) 121/94, pulse 70, temperature 98.2 F (36.8 C), temperature source Oral, height 5\' 10"  (1.778 m), weight 100.9 kg (222 lb 8 oz), SpO2 98 %.  General appearance: alert, cooperative and appears stated age Head: Normocephalic, without obvious abnormality, atraumatic Neck: supple, symmetrical, trachea midline Cardio: regular rate and rhythm Resp: clear to auscultation bilaterally Extremities: Intact sensation and capillary refill all digits.  +epl/fpl/io.  No wounds.  Pulses: 2+ and symmetric Skin: Skin color, texture, turgor normal. No rashes or lesions Neurologic: Grossly normal Incision/Wound: none  Assessment/Plan Left thumb cmc arthritis.  Non operative and operative treatment options were discussed with the patient and patient wishes to proceed with operative treatment. Risks, benefits, and alternatives of surgery were  discussed and the patient agrees with the plan of care.   Erik Smith 10/18/2017, 8:29 AM

## 2017-10-19 ENCOUNTER — Encounter (HOSPITAL_BASED_OUTPATIENT_CLINIC_OR_DEPARTMENT_OTHER): Payer: Self-pay | Admitting: Orthopedic Surgery

## 2017-11-09 ENCOUNTER — Ambulatory Visit: Payer: BC Managed Care – PPO | Admitting: Psychology

## 2017-11-09 DIAGNOSIS — F4323 Adjustment disorder with mixed anxiety and depressed mood: Secondary | ICD-10-CM

## 2017-11-09 DIAGNOSIS — F3112 Bipolar disorder, current episode manic without psychotic features, moderate: Secondary | ICD-10-CM

## 2017-11-22 ENCOUNTER — Ambulatory Visit (INDEPENDENT_AMBULATORY_CARE_PROVIDER_SITE_OTHER): Payer: BC Managed Care – PPO | Admitting: Psychology

## 2017-11-22 DIAGNOSIS — F3112 Bipolar disorder, current episode manic without psychotic features, moderate: Secondary | ICD-10-CM | POA: Diagnosis not present

## 2017-11-22 DIAGNOSIS — F4323 Adjustment disorder with mixed anxiety and depressed mood: Secondary | ICD-10-CM

## 2017-12-14 ENCOUNTER — Ambulatory Visit: Payer: BC Managed Care – PPO | Admitting: Psychology

## 2017-12-18 ENCOUNTER — Other Ambulatory Visit: Payer: Self-pay | Admitting: Orthopedic Surgery

## 2017-12-19 ENCOUNTER — Ambulatory Visit: Payer: BC Managed Care – PPO | Admitting: Psychology

## 2018-03-22 ENCOUNTER — Other Ambulatory Visit: Payer: Self-pay

## 2018-03-22 ENCOUNTER — Encounter (HOSPITAL_BASED_OUTPATIENT_CLINIC_OR_DEPARTMENT_OTHER): Payer: Self-pay | Admitting: *Deleted

## 2018-03-22 NOTE — Progress Notes (Signed)
Pt states that he had general surgery with last procedure and wants a nerve block this time. Informed pt that he can talk with anesthesia day of surgery. Instructed pt to be here 2 hours early to give pre op time to prepare him for surgery.

## 2018-03-26 ENCOUNTER — Other Ambulatory Visit: Payer: Self-pay | Admitting: Family Medicine

## 2018-03-26 NOTE — Telephone Encounter (Signed)
Valacyclovir Last filled:  08/15/17, #30/0 Last OV:  06/25/15 Next OV:  none

## 2018-03-28 ENCOUNTER — Encounter (HOSPITAL_BASED_OUTPATIENT_CLINIC_OR_DEPARTMENT_OTHER)
Admission: RE | Disposition: A | Discharge status: Home / Self Care | Payer: Self-pay | Source: Ambulatory Visit | Attending: Orthopedic Surgery

## 2018-03-28 ENCOUNTER — Encounter (HOSPITAL_BASED_OUTPATIENT_CLINIC_OR_DEPARTMENT_OTHER): Payer: Self-pay | Admitting: Anesthesiology

## 2018-03-28 ENCOUNTER — Ambulatory Visit (HOSPITAL_BASED_OUTPATIENT_CLINIC_OR_DEPARTMENT_OTHER)
Admission: RE | Admit: 2018-03-28 | Discharge: 2018-03-28 | Disposition: A | Discharge status: Home / Self Care | Payer: BC Managed Care – PPO | Source: Ambulatory Visit | Attending: Orthopedic Surgery | Admitting: Orthopedic Surgery

## 2018-03-28 ENCOUNTER — Ambulatory Visit (HOSPITAL_BASED_OUTPATIENT_CLINIC_OR_DEPARTMENT_OTHER): Payer: BC Managed Care – PPO | Admitting: Anesthesiology

## 2018-03-28 ENCOUNTER — Other Ambulatory Visit: Payer: Self-pay

## 2018-03-28 DIAGNOSIS — M25741 Osteophyte, right hand: Secondary | ICD-10-CM | POA: Diagnosis not present

## 2018-03-28 DIAGNOSIS — F319 Bipolar disorder, unspecified: Secondary | ICD-10-CM | POA: Diagnosis not present

## 2018-03-28 DIAGNOSIS — M1811 Unilateral primary osteoarthritis of first carpometacarpal joint, right hand: Secondary | ICD-10-CM | POA: Insufficient documentation

## 2018-03-28 DIAGNOSIS — M19041 Primary osteoarthritis, right hand: Secondary | ICD-10-CM | POA: Diagnosis present

## 2018-03-28 HISTORY — DX: Depression, unspecified: F32.A

## 2018-03-28 HISTORY — DX: Major depressive disorder, single episode, unspecified: F32.9

## 2018-03-28 HISTORY — PX: CARPOMETACARPEL SUSPENSION PLASTY: SHX5005

## 2018-03-28 SURGERY — CARPOMETACARPEL (CMC) SUSPENSION PLASTY
Anesthesia: Regional | Site: Thumb | Laterality: Right

## 2018-03-28 MED ORDER — ONDANSETRON HCL 4 MG/2ML IJ SOLN
INTRAMUSCULAR | Status: AC
  Filled 2018-03-28: qty 2

## 2018-03-28 MED ORDER — CEFAZOLIN SODIUM-DEXTROSE 2-4 GM/100ML-% IV SOLN
INTRAVENOUS | Status: AC
  Filled 2018-03-28: qty 100

## 2018-03-28 MED ORDER — ONDANSETRON HCL 4 MG/2ML IJ SOLN: 4.0000 mg | Freq: Once | INTRAMUSCULAR | Status: DC | PRN

## 2018-03-28 MED ORDER — ONDANSETRON HCL 4 MG/2ML IJ SOLN
INTRAMUSCULAR | Status: DC | PRN
  Administered 2018-03-28: 4 mg via INTRAVENOUS

## 2018-03-28 MED ORDER — ONDANSETRON HCL 4 MG/2ML IJ SOLN: INTRAMUSCULAR | Status: DC | PRN

## 2018-03-28 MED ORDER — BUPIVACAINE-EPINEPHRINE (PF) 0.5% -1:200000 IJ SOLN
INTRAMUSCULAR | Status: DC | PRN
  Administered 2018-03-28: 30 mL via PERINEURAL

## 2018-03-28 MED ORDER — PROPOFOL 500 MG/50ML IV EMUL
INTRAVENOUS | Status: DC | PRN
  Administered 2018-03-28: 25 ug/kg/min via INTRAVENOUS

## 2018-03-28 MED ORDER — FENTANYL CITRATE (PF) 100 MCG/2ML IJ SOLN
INTRAMUSCULAR | Status: AC
  Filled 2018-03-28: qty 2

## 2018-03-28 MED ORDER — MIDAZOLAM HCL 2 MG/2ML IJ SOLN
INTRAMUSCULAR | Status: AC
  Filled 2018-03-28: qty 2

## 2018-03-28 MED ORDER — CHLORHEXIDINE GLUCONATE 4 % EX LIQD: 60.0000 mL | Freq: Once | CUTANEOUS | Status: DC

## 2018-03-28 MED ORDER — LIDOCAINE 2% (20 MG/ML) 5 ML SYRINGE
INTRAMUSCULAR | Status: AC
  Filled 2018-03-28: qty 5

## 2018-03-28 MED ORDER — PROPOFOL 10 MG/ML IV BOLUS
INTRAVENOUS | Status: AC
  Filled 2018-03-28: qty 20

## 2018-03-28 MED ORDER — MEPERIDINE HCL 25 MG/ML IJ SOLN: 6.2500 mg | INTRAMUSCULAR | Status: DC | PRN

## 2018-03-28 MED ORDER — SCOPOLAMINE 1 MG/3DAYS TD PT72: 1.0000 | MEDICATED_PATCH | Freq: Once | TRANSDERMAL | Status: DC | PRN

## 2018-03-28 MED ORDER — DEXAMETHASONE SODIUM PHOSPHATE 10 MG/ML IJ SOLN
INTRAMUSCULAR | Status: AC
  Filled 2018-03-28: qty 1

## 2018-03-28 MED ORDER — HYDROMORPHONE HCL 1 MG/ML IJ SOLN: 0.2500 mg | INTRAMUSCULAR | Status: DC | PRN

## 2018-03-28 MED ORDER — MIDAZOLAM HCL 2 MG/2ML IJ SOLN
1.0000 mg | INTRAMUSCULAR | Status: DC | PRN
  Administered 2018-03-28: 2 mg via INTRAVENOUS

## 2018-03-28 MED ORDER — BUPIVACAINE HCL (PF) 0.25 % IJ SOLN
INTRAMUSCULAR | Status: AC
  Filled 2018-03-28: qty 90

## 2018-03-28 MED ORDER — CEFAZOLIN SODIUM-DEXTROSE 2-4 GM/100ML-% IV SOLN
2.0000 g | INTRAVENOUS | Status: AC
  Administered 2018-03-28: 2 g via INTRAVENOUS

## 2018-03-28 MED ORDER — LACTATED RINGERS IV SOLN
INTRAVENOUS | Status: DC
  Administered 2018-03-28 (×2): via INTRAVENOUS

## 2018-03-28 MED ORDER — FENTANYL CITRATE (PF) 100 MCG/2ML IJ SOLN
50.0000 ug | INTRAMUSCULAR | Status: DC | PRN
  Administered 2018-03-28: 100 ug via INTRAVENOUS

## 2018-03-28 SURGICAL SUPPLY — 69 items
BANDAGE ACE 3X5.8 VEL STRL LF (GAUZE/BANDAGES/DRESSINGS) IMPLANT
BIT DRILL PASSING CMC 1/4 FLEX (BIT) ×1 IMPLANT
BLADE MINI RND TIP GREEN BEAV (BLADE) ×2 IMPLANT
BLADE SURG 15 STRL LF DISP TIS (BLADE) ×2 IMPLANT
BLADE SURG 15 STRL SS (BLADE) ×2
BNDG ELASTIC 2X5.8 VLCR STR LF (GAUZE/BANDAGES/DRESSINGS) IMPLANT
BNDG ESMARK 4X9 LF (GAUZE/BANDAGES/DRESSINGS) ×2 IMPLANT
BNDG GAUZE ELAST 4 BULKY (GAUZE/BANDAGES/DRESSINGS) ×2 IMPLANT
BUTTON ALL-SUT W/BACKSTOP (Orthopedic Implant) ×2 IMPLANT
CHLORAPREP W/TINT 26ML (MISCELLANEOUS) ×2 IMPLANT
CORD BIPOLAR FORCEPS 12FT (ELECTRODE) ×2 IMPLANT
COVER BACK TABLE 60X90IN (DRAPES) ×2 IMPLANT
COVER MAYO STAND STRL (DRAPES) ×2 IMPLANT
COVER WAND RF STERILE (DRAPES) IMPLANT
CUFF TOURNIQUET SINGLE 18IN (TOURNIQUET CUFF) ×2 IMPLANT
DECANTER SPIKE VIAL GLASS SM (MISCELLANEOUS) IMPLANT
DRAPE EXTREMITY T 121X128X90 (DRAPE) ×2 IMPLANT
DRAPE OEC MINIVIEW 54X84 (DRAPES) ×2 IMPLANT
DRAPE SURG 17X23 STRL (DRAPES) ×2 IMPLANT
DRILL PASSING CMC 1/4 FLEX (BIT) ×2
DRSG PAD ABDOMINAL 8X10 ST (GAUZE/BANDAGES/DRESSINGS) IMPLANT
GAUZE SPONGE 4X4 12PLY STRL (GAUZE/BANDAGES/DRESSINGS) ×2 IMPLANT
GAUZE XEROFORM 1X8 LF (GAUZE/BANDAGES/DRESSINGS) ×2 IMPLANT
GLOVE BIO SURGEON STRL SZ7.5 (GLOVE) ×2 IMPLANT
GLOVE BIOGEL PI IND STRL 8 (GLOVE) ×2 IMPLANT
GLOVE BIOGEL PI IND STRL 8.5 (GLOVE) ×1 IMPLANT
GLOVE BIOGEL PI INDICATOR 8 (GLOVE) ×2
GLOVE BIOGEL PI INDICATOR 8.5 (GLOVE) ×1
GLOVE SURG ORTHO 8.0 STRL STRW (GLOVE) ×2 IMPLANT
GLOVE SURG SYN 8.0 (GLOVE) ×2 IMPLANT
GOWN STRL REUS W/TWL XL LVL3 (GOWN DISPOSABLE) ×6 IMPLANT
K-WIRE .035X4 (WIRE) IMPLANT
NDL SAFETY ECLIPSE 18X1.5 (NEEDLE) IMPLANT
NEEDLE HYPO 18GX1.5 SHARP (NEEDLE)
NEEDLE HYPO 22GX1.5 SAFETY (NEEDLE) IMPLANT
NEEDLE HYPO 25X1 1.5 SAFETY (NEEDLE) IMPLANT
NEEDLE KEITH (NEEDLE) IMPLANT
NS IRRIG 1000ML POUR BTL (IV SOLUTION) ×2 IMPLANT
PACK BASIN DAY SURGERY FS (CUSTOM PROCEDURE TRAY) ×2 IMPLANT
PAD CAST 3X4 CTTN HI CHSV (CAST SUPPLIES) ×1 IMPLANT
PAD CAST 4YDX4 CTTN HI CHSV (CAST SUPPLIES) IMPLANT
PADDING CAST ABS 4INX4YD NS (CAST SUPPLIES) ×1
PADDING CAST ABS COTTON 4X4 ST (CAST SUPPLIES) ×1 IMPLANT
PADDING CAST COTTON 3X4 STRL (CAST SUPPLIES) ×1
PADDING CAST COTTON 4X4 STRL (CAST SUPPLIES)
PASSER SUT SWANSON 36MM LOOP (INSTRUMENTS) IMPLANT
SLEEVE SCD COMPRESS KNEE MED (MISCELLANEOUS) IMPLANT
SLING ARM FOAM STRAP XLG (SOFTGOODS) ×2 IMPLANT
SPLINT FAST PLASTER 5X30 (CAST SUPPLIES)
SPLINT PLASTER CAST FAST 5X30 (CAST SUPPLIES) IMPLANT
SPLINT PLASTER CAST XFAST 4X15 (CAST SUPPLIES) IMPLANT
SPLINT PLASTER XTRA FAST SET 4 (CAST SUPPLIES)
STOCKINETTE 4X48 STRL (DRAPES) ×2 IMPLANT
SUT ETHIBOND 3-0 V-5 (SUTURE) ×2 IMPLANT
SUT ETHILON 3 0 PS 1 (SUTURE) IMPLANT
SUT ETHILON 4 0 PS 2 18 (SUTURE) ×2 IMPLANT
SUT FIBERWIRE 2-0 18 17.9 3/8 (SUTURE)
SUT MERSILENE 2.0 SH NDLE (SUTURE) IMPLANT
SUT MERSILENE 4 0 P 3 (SUTURE) IMPLANT
SUT SILK 4 0 PS 2 (SUTURE) IMPLANT
SUT STEEL 3 0 (SUTURE) IMPLANT
SUT VIC AB 0 SH 27 (SUTURE) IMPLANT
SUT VICRYL 4-0 PS2 18IN ABS (SUTURE) ×2 IMPLANT
SUTURE FIBERWR 2-0 18 17.9 3/8 (SUTURE) IMPLANT
SYR BULB 3OZ (MISCELLANEOUS) ×4 IMPLANT
SYR CONTROL 10ML LL (SYRINGE) IMPLANT
TOWEL GREEN STERILE FF (TOWEL DISPOSABLE) ×4 IMPLANT
TOWEL OR NON WOVEN STRL DISP B (DISPOSABLE) ×2 IMPLANT
UNDERPAD 30X30 (UNDERPADS AND DIAPERS) ×2 IMPLANT

## 2018-03-28 NOTE — H&P (Signed)
Erik Smith is an 52 y.o. male.   Chief Complaint: right thumb arthritis HPI: 52 yo male with pain at right thumb cmc joint.  XR show arthritic changes.  He wishes to have right thumb trapeziectomy and suspensionplasty.  Allergies:  Allergies  Allergen Reactions  . Morphine And Related Itching    Past Medical History:  Diagnosis Date  . Arthritis   . Asthma    as a child  . Bipolar disorder, unspecified ([place])    hospitalization 2004 for depression, bipolar, ambien overdose  . Cholesteatoma 05/2010   s/p R ear surgery, residual tinnitus, planning on L ear surgery  . Depression    bipolar  . Family history of ischemic heart disease   . History of asthma   . Migraine without aura, without mention of intractable migraine without mention of status migrainosus   . Spinal stenosis   . Unspecified adjustment reaction     Past Surgical History:  Procedure Laterality Date  . CARPOMETACARPEL SUSPENSION PLASTY Left 10/18/2017   Procedure: LEFT THUMB TRAPEZIECTOMY AND SUSPENSIONPLASTY;  Surgeon: Leanora Cover, MD;  Location: [facility];  Service: Orthopedics;  Laterality: Left;  . COLONOSCOPY  1999   normal (in [place])  . ESOPHAGUS SURGERY  2003   torn esophagus  . [place]   for cholesteatoma, bilateral first L then R  . NECK SURGERY  2002   spinal stenosis and arthritis  . perianal abscess  1998    Family History: Family History  Problem Relation Age of Onset  . Coronary artery disease Father 62  . Sickle cell anemia Mother   . Asthma Mother        smoker  . Prostate cancer Paternal Grandfather 38       deceased from prostate CA  . Lung cancer Paternal Grandmother 88       smoker  . Breast cancer Paternal Uncle   . Stroke Neg Hx   . Diabetes Neg Hx     Social History:   reports that he has never smoked. He has never used smokeless tobacco. He reports that he drinks alcohol. He reports that he does not use  drugs.  Medications: Medications Prior to Admission  Medication Sig Dispense Refill  . doxycycline (DORYX) 100 MG EC tablet Take 100 mg by mouth 2 (two) times daily.    Marland Kitchen ibuprofen (ADVIL,MOTRIN) 200 MG tablet Take 800 mg by mouth every 6 (six) hours as needed.    . lithium carbonate (LITHOBID) 300 MG CR tablet Take 2 tablets (600 mg total) by mouth 2 (two) times daily. 120 tablet 2  . valACYclovir (VALTREX) 500 MG tablet TAKE 1 TABLET BY MOUTH TWICE DAILY. TAKE 3 DAYS AT A TIME 30 tablet 0  . HYDROcodone-acetaminophen (NORCO) 5-325 MG tablet 1-2 tabs po q6 hours prn pain 30 tablet 0    No results found for this or any previous visit (from the past 48 hour(s)).  No results found.   A comprehensive review of systems was negative.  Blood pressure 128/63, pulse 86, temperature 97.8 F (36.6 C), temperature source Oral, resp. rate 19, height 5\' 10"  (1.778 m), weight 102.8 kg, SpO2 97 %.  General appearance: alert, cooperative and appears stated age Head: Normocephalic, without obvious abnormality, atraumatic Neck: supple, symmetrical, trachea midline Cardio: regular rate and rhythm Resp: clear to auscultation bilaterally Extremities: Intact sensation and capillary refill all digits.  +epl/fpl/io.  No wounds.  Pulses: 2+ and symmetric Skin: Skin  color, texture, turgor normal. No rashes or lesions Neurologic: Grossly normal Incision/Wound: none  Assessment/Plan Right thumb cmc arthritis.  Non operative and operative treatment options have been discussed with the patient and patient wishes to proceed with operative treatment. Risks, benefits, and alternatives of surgery have been discussed and the patient agrees with the plan of care.   Leanora Cover 03/28/2018, 8:32 AM

## 2018-03-28 NOTE — Anesthesia Procedure Notes (Signed)
Anesthesia Regional Block: Supraclavicular block   Pre-Anesthetic Checklist: ,, timeout performed, Correct Patient, Correct Site, Correct Laterality, Correct Procedure, Correct Position, site marked, Risks and benefits discussed,  Surgical consent,  Pre-op evaluation,  At surgeon's request and post-op pain management  Laterality: Right  Prep: chloraprep       Needles:   Needle Type: Echogenic Stimulator Needle     Needle Length: 9cm  Needle Gauge: 21     Additional Needles:   Procedures:, nerve stimulator,,,,,,,   Nerve Stimulator or Paresthesia:  Response: 0.4 mA,   Additional Responses:   Narrative:  Start time: 03/28/2018 8:15 AM End time: 03/28/2018 8:25 AM Injection made incrementally with aspirations every 5 mL.  Performed by: Personally  Anesthesiologist: Lillia Abed, MD  Additional Notes: Monitors applied. Patient sedated. Sterile prep and drape,hand hygiene and sterile gloves were used. Relevant anatomy identified.Needle position confirmed.Local anesthetic injected incrementally after negative aspiration. Local anesthetic spread visualized around nerve(s). Vascular puncture avoided. No complications. Image printed for medical record.The patient tolerated the procedure well.

## 2018-03-28 NOTE — Progress Notes (Signed)
Assisted Dr. Ossey with right, ultrasound guided, supraclavicular block. Side rails up, monitors on throughout procedure. See vital signs in flow sheet. Tolerated Procedure well. 

## 2018-03-28 NOTE — Anesthesia Procedure Notes (Signed)
Procedure Name: MAC Date/Time: 03/28/2018 8:59 AM Performed by: Marrianne Mood, CRNA Pre-anesthesia Checklist: Patient identified, Timeout performed, Emergency Drugs available, Suction available and Patient being monitored Patient Re-evaluated:Patient Re-evaluated prior to induction Oxygen Delivery Method: Simple face mask Preoxygenation: Pre-oxygenation with 100% oxygen

## 2018-03-28 NOTE — Discharge Instructions (Addendum)
Post Anesthesia Home Care Instructions  Activity: Get plenty of rest for the remainder of the day. A responsible individual must stay with you for 24 hours following the procedure.  For the next 24 hours, DO NOT: -Drive a car -Operate machinery -Drink alcoholic beverages -Take any medication unless instructed by your physician -Make any legal decisions or sign important papers.  Meals: Start with liquid foods such as gelatin or soup. Progress to regular foods as tolerated. Avoid greasy, spicy, heavy foods. If nausea and/or vomiting occur, drink only clear liquids until the nausea and/or vomiting subsides. Call your physician if vomiting continues.  Special Instructions/Symptoms: Your throat may feel dry or sore from the anesthesia or the breathing tube placed in your throat during surgery. If this causes discomfort, gargle with warm salt water. The discomfort should disappear within 24 hours.  If you had a scopolamine patch placed behind your ear for the management of post- operative nausea and/or vomiting:  1. The medication in the patch is effective for 72 hours, after which it should be removed.  Wrap patch in a tissue and discard in the trash. Wash hands thoroughly with soap and water. 2. You may remove the patch earlier than 72 hours if you experience unpleasant side effects which may include dry mouth, dizziness or visual disturbances. 3. Avoid touching the patch. Wash your hands with soap and water after contact with the patch.      Regional Anesthesia Blocks  1. Numbness or the inability to move the "blocked" extremity may last from 3-48 hours after placement. The length of time depends on the medication injected and your individual response to the medication. If the numbness is not going away after 48 hours, call your surgeon.  2. The extremity that is blocked will need to be protected until the numbness is gone and the  Strength has returned. Because you cannot feel it, you  will need to take extra care to avoid injury. Because it may be weak, you may have difficulty moving it or using it. You may not know what position it is in without looking at it while the block is in effect.  3. For blocks in the legs and feet, returning to weight bearing and walking needs to be done carefully. You will need to wait until the numbness is entirely gone and the strength has returned. You should be able to move your leg and foot normally before you try and bear weight or walk. You will need someone to be with you when you first try to ensure you do not fall and possibly risk injury.  4. Bruising and tenderness at the needle site are common side effects and will resolve in a few days.  5. Persistent numbness or new problems with movement should be communicated to the surgeon or the [facility] ([phone])/ [facility] ([phone]).    Hand Center Instructions Hand Surgery  Wound Care: Keep your hand elevated above the level of your heart.  Do not allow it to dangle by your side.  Keep the dressing dry and do not remove it unless your doctor advises you to do so.  He will usually change it at the time of your post-op visit.  Moving your fingers is advised to stimulate circulation but will depend on the site of your surgery.  If you have a splint applied, your doctor will advise you regarding movement.  Activity: Do not drive or operate machinery today.  Rest today and   then you may return to your normal activity and work as indicated by your physician.  Diet:  Drink liquids today or eat a light diet.  You may resume a regular diet tomorrow.    General expectations: Pain for two to three days. Fingers may become slightly swollen.  Call your doctor if any of the following occur: Severe pain not relieved by pain medication. Elevated temperature. Dressing soaked with blood. Inability to move fingers. White or bluish color to fingers.  

## 2018-03-28 NOTE — Anesthesia Preprocedure Evaluation (Signed)
Anesthesia Evaluation  Patient identified by MRN, date of birth, ID band Patient awake    Reviewed: Allergy & Precautions, NPO status , Patient's Chart, lab work & pertinent test results  Airway Mallampati: I  TM Distance: >3 FB Neck ROM: Full    Dental   Pulmonary    Pulmonary exam normal        Cardiovascular Normal cardiovascular exam     Neuro/Psych Depression Bipolar Disorder    GI/Hepatic   Endo/Other    Renal/GU      Musculoskeletal   Abdominal   Peds  Hematology   Anesthesia Other Findings   Reproductive/Obstetrics                             Anesthesia Physical Anesthesia Plan  ASA: II  Anesthesia Plan: Regional   Post-op Pain Management:    Induction: Intravenous  PONV Risk Score and Plan: 1 and Ondansetron and Treatment may vary due to age or medical condition  Airway Management Planned: Simple Face Mask  Additional Equipment:   Intra-op Plan:   Post-operative Plan:   Informed Consent: I have reviewed the patients History and Physical, chart, labs and discussed the procedure including the risks, benefits and alternatives for the proposed anesthesia with the patient or authorized representative who has indicated his/her understanding and acceptance.     Plan Discussed with: CRNA and Surgeon  Anesthesia Plan Comments:         Anesthesia Quick Evaluation

## 2018-03-28 NOTE — Anesthesia Postprocedure Evaluation (Signed)
Anesthesia Post Note  Patient: Erik Smith  Procedure(s) Performed: CARPOMETACARPEL [facility]) SUSPENSION PLASTY AND TRAPEZIECTOMY (Right Thumb)     Patient location during evaluation: PACU Anesthesia Type: Regional Level of consciousness: awake and alert and patient cooperative Pain management: pain level controlled Vital Signs Assessment: post-procedure vital signs reviewed and stable Respiratory status: spontaneous breathing and respiratory function stable Cardiovascular status: stable Anesthetic complications: no    Last Vitals:  Vitals:   03/28/18 1045 03/28/18 1110  BP: 117/86 129/85  Pulse: 71   Resp: 18   Temp:  (!) 36.4 C  SpO2: 100% 95%    Last Pain:  Vitals:   03/28/18 1110  TempSrc:   PainSc: 0-No pain                 Nazyia Gaugh DAVID

## 2018-03-28 NOTE — Op Note (Signed)
I assisted Surgeon(s) and Role:    Leanora Cover, MD - Primary on the Procedure(s): CARPOMETACARPEL [facility]) SUSPENSION PLASTY AND TRAPEZIECTOMY on 03/28/2018.  I provided assistance on this case as follows: setup, approach, removal of the trapezium, transfer of the tendon, placement of the microlink, closure of the incisions and application of the dressings and splint.  Electronically signed by: Daryll Brod, MD Date: 03/28/2018 Time: 10:21 AM

## 2018-03-28 NOTE — Transfer of Care (Signed)
Immediate Anesthesia Transfer of Care Note  Patient: Erik Smith  Procedure(s) Performed: CARPOMETACARPEL [facility]) SUSPENSION PLASTY AND TRAPEZIECTOMY (Right Thumb)  Patient Location: PACU  Anesthesia Type:MAC combined with regional for post-op pain  Level of Consciousness: awake and patient cooperative  Airway & Oxygen Therapy: Patient Spontanous Breathing and Patient connected to face mask oxygen  Post-op Assessment: Report given to RN and Post -op Vital signs reviewed and stable  Post vital signs: Reviewed and stable  Last Vitals:  Vitals Value Taken Time  BP    Temp    Pulse 92 03/28/2018 10:21 AM  Resp 18 03/28/2018 10:21 AM  SpO2 96 % 03/28/2018 10:21 AM  Vitals shown include unvalidated device data.  Last Pain:  Vitals:   03/28/18 0658  TempSrc: Oral  PainSc: 0-No pain      Patients Stated Pain Goal: 0 (33/54/56 2563)  Complications: No apparent anesthesia complications

## 2018-03-28 NOTE — Op Note (Signed)
NAME: Erik Smith MEDICAL RECORD NO: [id number] DATE OF BIRTH: 1965/10/07 FACILITY: [facility] LOCATION: [facility] PHYSICIAN: Tennis Must, MD   OPERATIVE REPORT   DATE OF PROCEDURE: 03/28/18    PREOPERATIVE DIAGNOSIS:   Right thumb CMC osteoarthritis   POSTOPERATIVE DIAGNOSIS: Right thumb CMC osteoarthritis      PROCEDURE:  Right thumb trapeziectomy and suspension plasty with FCR tendon transfer   SURGEON:  Leanora Cover, M.D.   ASSISTANT: Daryll Brod, MD   ANESTHESIA:  Regional with sedation   INTRAVENOUS FLUIDS:  Per anesthesia flow sheet.   ESTIMATED BLOOD LOSS:  Minimal.   COMPLICATIONS:  None.   SPECIMENS:  none   TOURNIQUET TIME:    Total Tourniquet Time Documented: Upper Arm (Right) - 75 minutes Total: Upper Arm (Right) - 75 minutes    DISPOSITION:  Stable to PACU.   INDICATIONS: 52 year old male with osteoarthritis of right thumb CMC joint.  He wishes to proceed with trapeziectomy and suspension plasty.  He has had this done on the left side and has been happy with the results. Risks, benefits and alternatives of surgery were discussed including the risks of blood loss, infection, damage to nerves, vessels, tendons, ligaments, bone for surgery, need for additional surgery, complications with wound healing, continued pain, nonunion, malunion, stiffness.  He voiced understanding of these risks and elected to proceed.  OPERATIVE COURSE:  After being identified preoperatively by myself,  the patient and I agreed on the procedure and site of the procedure.  The surgical site was marked.  Surgical consent had been signed. He was given IV Ancef as preoperative antibiotic prophylaxis. He was transferred to the operating room and placed on the operating table in supine position with the Right upper extremity on an arm board.  Sedation was induced by the anesthesiologist. A regional block had been performed by anesthesia in preoperative holding.   Right upper  extremity was prepped and draped in normal sterile orthopedic fashion.  A surgical pause was performed between the surgeons, anesthesia, and operating room staff and all were in agreement as to the patient, procedure, and site of procedure.  Tourniquet at the proximal aspect of the extremity was inflated to 250 mmHg after exsanguination of the arm with an Esmarch bandage.    Incision was made at the dorsum of the thumb over the [facility] joint and carried in subtenons tissues by spreading technique.  Bipolar electrocautery was used to obtain hemostasis.  The interval between the APL and EPB tendons was formed.  Care was taken to protect all cutaneous branches of nerve.  The capsule sharply incised.  The trapezium was identified.  It had large osteophytes.  It was removed in a piecemeal fashion.  The trapezium had been confirmed by C arm.  The FCR tendon was identified for transfer.  The micro link device was used.  The guidepin was placed through the base of the thumb metacarpal and across index finger metacarpal.  This was confirmed on C-arm.  The device was then passed.  An incision was made over the index finger metacarpal to aid in this.  The half of the FCR tendon was harvested proximally at the wound.  The micro link device was then tightened at the index metacarpal and tied.  The FCR tendon was then transferred through the dorsal aspect of the APL tendon distally and sutured to itself with Ethibond suture.  This provided good suspension of the thumb metacarpal.  C-arm was used in AP and lateral  projections to confirm this.  The wound was copiously irrigated with sterile saline.  The capsule was repaired with a Vicryl suture.  An inverted interrupted Vicryl sutures placed in the skin and the skin was closed with 4-0 nylon in a horizontal mattress fashion.  Wounds were dressed with sterile Xeroform 4 x 4's and wrapped with a Kerlix bandage.  Thumb spica splint was placed and wrapped with Kerlix and Ace bandage.  The  tourniquet was deflated at 75 minutes.  Fingertips were pink with brisk capillary refill after deflation of tourniquet.  The operative  drapes were broken down.  The patient was awoken from anesthesia safely.  He was transferred back to the stretcher and taken to PACU in stable condition.  I will see him back in the office in 1 week for postoperative followup.  He states he has plenty of pain medication remaining at home from his last surgery and does not need anymore at this time.   Leanora Cover, MD Electronically signed, 03/28/18

## 2018-03-29 ENCOUNTER — Encounter (HOSPITAL_BASED_OUTPATIENT_CLINIC_OR_DEPARTMENT_OTHER): Payer: Self-pay | Admitting: Orthopedic Surgery

## 2018-04-01 ENCOUNTER — Encounter: Payer: Self-pay | Admitting: Family Medicine

## 2018-04-01 ENCOUNTER — Ambulatory Visit: Payer: BC Managed Care – PPO | Admitting: Family Medicine

## 2018-04-01 VITALS — BP 128/84 | HR 91 | Temp 98.2°F | Ht 70.0 in | Wt 224.2 lb

## 2018-04-01 DIAGNOSIS — R05 Cough: Secondary | ICD-10-CM | POA: Diagnosis not present

## 2018-04-01 DIAGNOSIS — R053 Chronic cough: Secondary | ICD-10-CM

## 2018-04-01 DIAGNOSIS — F319 Bipolar disorder, unspecified: Secondary | ICD-10-CM

## 2018-04-01 DIAGNOSIS — R052 Subacute cough: Secondary | ICD-10-CM | POA: Insufficient documentation

## 2018-04-01 MED ORDER — GUAIFENESIN-CODEINE 100-10 MG/5ML PO SYRP: 5.0000 mL | ORAL_SOLUTION | Freq: Two times a day (BID) | ORAL | 0 refills | Status: DC | PRN

## 2018-04-01 MED ORDER — FLUTICASONE PROPIONATE 50 MCG/ACT NA SUSP: 2.0000 | Freq: Every day | NASAL | 1 refills | Status: DC

## 2018-04-01 NOTE — Patient Instructions (Signed)
I wonder about allergic cause of cough given inflammation in sinuses and back of throat.  Treat with nasal saline, flonase nasal steroid daily, and cheratussin cough syrup for night time (with codeine) Watch for fever >101, worsening productive cough, or not improving with above.

## 2018-04-01 NOTE — Assessment & Plan Note (Addendum)
No signs of ongoing bacterial infection. Not consistent with GERD related cough. Not on ACEI.  Anticipate allergic component to cough - suggested flonase daily, nasal saline irrigation, and cheratussin for night time cough. Update if not improving with treatment.

## 2018-04-01 NOTE — Assessment & Plan Note (Signed)
He will return for labs and CPE in the next month.

## 2018-04-01 NOTE — Progress Notes (Signed)
BP 128/84 (BP Location: Left Arm, Patient Position: Sitting, Cuff Size: Normal)   Pulse 91   Temp 98.2 F (36.8 C) (Oral)   Ht 5\' 10"  (1.778 m)   Wt 224 lb 4 oz (101.7 kg)   SpO2 96%   BMI 32.18 kg/m    CC: persistent cough Subjective:    Patient ID: Erik Smith, male    DOB: November 22, 1965, 52 y.o.   MRN: [id number]  HPI: Erik Smith is a 52 y.o. male presenting on 04/01/2018 for Cough (C/o dry cough- worse in the mornings for 3 wks. Seen at [facility] twice and treated with abx. Still has cough. )   Last seen 06/2015 Ongoing dry cough for the past 2 months. Seen twice at [facility], treated with Augmentin course, tessalon perls (ineffective), zyrtec, then again OTC cough syrup.   Cough worse at night and in the mornings. Some PNdrainage.   No fevers/chills, no congestion, ear or tooth pain, ST, facial pain or headache, dyspnea or wheezing.   Recent hand surgery L then R.  No h/o allergic rhinitis. No h/o GERD.   On lithium for h/o bipolar. Overdue for f/u.   Relevant past medical, surgical, family and social history reviewed and updated as indicated. Interim medical history since our last visit reviewed. Allergies and medications reviewed and updated. Outpatient Medications Prior to Visit  Medication Sig Dispense Refill  . ibuprofen (ADVIL,MOTRIN) 200 MG tablet Take 800 mg by mouth every 6 (six) hours as needed.    . lithium carbonate (LITHOBID) 300 MG CR tablet Take 2 tablets (600 mg total) by mouth 2 (two) times daily. 120 tablet 2  . valACYclovir (VALTREX) 500 MG tablet TAKE 1 TABLET BY MOUTH TWICE DAILY. TAKE 3 DAYS AT A TIME 30 tablet 0   No facility-administered medications prior to visit.      Per HPI unless specifically indicated in ROS section below Review of Systems     Objective:    BP 128/84 (BP Location: Left Arm, Patient Position: Sitting, Cuff Size: Normal)   Pulse 91   Temp 98.2 F (36.8 C) (Oral)   Ht 5\' 10"  (1.778 m)   Wt 224 lb 4 oz (101.7 kg)    SpO2 96%   BMI 32.18 kg/m   Wt Readings from Last 3 Encounters:  04/01/18 224 lb 4 oz (101.7 kg)  03/28/18 226 lb 10.1 oz (102.8 kg)  10/18/17 222 lb 8 oz (100.9 kg)    Physical Exam  Constitutional: He appears well-developed and well-nourished. No distress.  HENT:  Head: Normocephalic and atraumatic.  Right Ear: Hearing, external ear and ear canal normal.  Left Ear: Hearing, tympanic membrane, external ear and ear canal normal.  Nose: Mucosal edema (nasal mucosal inflammation/congestion/erythema) and rhinorrhea present. Right sinus exhibits no maxillary sinus tenderness and no frontal sinus tenderness. Left sinus exhibits no maxillary sinus tenderness and no frontal sinus tenderness.  Mouth/Throat: Uvula is midline and mucous membranes are normal. Posterior oropharyngeal edema (cobblestoning) and posterior oropharyngeal erythema (mild) present. No oropharyngeal exudate or tonsillar abscesses.  R TM with post op changes s/p cholesteatoma surgery  Eyes: Pupils are equal, round, and reactive to light. Conjunctivae and EOM are normal. No scleral icterus.  Neck: Normal range of motion. Neck supple.  Cardiovascular: Normal rate, regular rhythm, normal heart sounds and intact distal pulses.  No murmur heard. Pulmonary/Chest: Effort normal and breath sounds normal. No respiratory distress. He has no wheezes. He has no rales.  Lungs clear  Lymphadenopathy:  He has no cervical adenopathy.  Skin: Skin is warm and dry. No rash noted.  Nursing note and vitals reviewed.      Assessment & Plan:   Problem List Items Addressed This Visit    Chronic cough - Primary    No signs of ongoing bacterial infection. Not consistent with GERD related cough. Not on ACEI.  Anticipate allergic component to cough - suggested flonase daily, nasal saline irrigation, and cheratussin for night time cough. Update if not improving with treatment.       Bipolar disorder, unspecified ([place])    He will return for  labs and CPE in the next month.           Meds ordered this encounter  Medications  . fluticasone (FLONASE) 50 MCG/ACT nasal spray    Sig: Place 2 sprays into both nostrils daily.    Dispense:  16 g    Refill:  1  . guaiFENesin-codeine (CHERATUSSIN AC) 100-10 MG/5ML syrup    Sig: Take 5 mLs by mouth 2 (two) times daily as needed for cough (sedation precautions).    Dispense:  120 mL    Refill:  0   No orders of the defined types were placed in this encounter.   Follow up plan: Return if symptoms worsen or fail to improve.  Ria Bush, MD

## 2018-04-27 ENCOUNTER — Other Ambulatory Visit: Payer: Self-pay | Admitting: Family Medicine

## 2018-04-27 DIAGNOSIS — E669 Obesity, unspecified: Secondary | ICD-10-CM | POA: Insufficient documentation

## 2018-04-27 DIAGNOSIS — Z125 Encounter for screening for malignant neoplasm of prostate: Secondary | ICD-10-CM

## 2018-04-27 DIAGNOSIS — F319 Bipolar disorder, unspecified: Secondary | ICD-10-CM

## 2018-04-29 ENCOUNTER — Other Ambulatory Visit (INDEPENDENT_AMBULATORY_CARE_PROVIDER_SITE_OTHER): Payer: BC Managed Care – PPO

## 2018-04-29 DIAGNOSIS — F319 Bipolar disorder, unspecified: Secondary | ICD-10-CM

## 2018-04-29 DIAGNOSIS — Z125 Encounter for screening for malignant neoplasm of prostate: Secondary | ICD-10-CM | POA: Diagnosis not present

## 2018-04-29 DIAGNOSIS — E669 Obesity, unspecified: Secondary | ICD-10-CM

## 2018-04-29 LAB — LIPID PANEL
Cholesterol: 165 mg/dL (ref 0–200)
HDL: 59.1 mg/dL (ref >39.00)
LDL Cholesterol: 95 mg/dL (ref 0–99)
NonHDL: 106.21
Total CHOL/HDL Ratio: 3
Triglycerides: 55 mg/dL (ref 0.0–149.0)
VLDL: 11 mg/dL (ref 0.0–40.0)

## 2018-04-29 LAB — COMPREHENSIVE METABOLIC PANEL
ALT: 35 U/L (ref 0–53)
AST: 24 U/L (ref 0–37)
Albumin: 4.4 g/dL (ref 3.5–5.2)
Alkaline Phosphatase: 74 U/L (ref 39–117)
BUN: 18 mg/dL (ref 6–23)
CO2: 27 mEq/L (ref 19–32)
Calcium: 9.5 mg/dL (ref 8.4–10.5)
Chloride: 105 mEq/L (ref 96–112)
Creatinine, Ser: 1.01 mg/dL (ref 0.40–1.50)
GFR: 81.85 mL/min (ref >60.00)
Glucose, Bld: 97 mg/dL (ref 70–99)
Potassium: 4.8 mEq/L (ref 3.5–5.1)
Sodium: 140 mEq/L (ref 135–145)
Total Bilirubin: 0.5 mg/dL (ref 0.2–1.2)
Total Protein: 6.8 g/dL (ref 6.0–8.3)

## 2018-04-29 LAB — PSA: PSA: 0.24 ng/mL (ref 0.10–4.00)

## 2018-04-29 LAB — TSH: TSH: 1.44 u[IU]/mL (ref 0.35–4.50)

## 2018-04-30 NOTE — Addendum Note (Signed)
Addended by: Ellamae Sia on: 04/30/2018 10:34 AM   Modules accepted: Orders

## 2018-05-01 ENCOUNTER — Encounter: Payer: Self-pay | Admitting: Family Medicine

## 2018-05-01 LAB — LITHIUM LEVEL: Lithium Lvl: 0.5 mmol/L — ABNORMAL LOW (ref 0.6–1.2)

## 2018-05-01 NOTE — Progress Notes (Signed)
BP 120/84 (BP Location: Left Arm, Patient Position: Sitting, Cuff Size: Large)   Pulse 72   Temp 98.2 F (36.8 C) (Oral)   Ht 5\' 10"  (1.778 m)   Wt 230 lb 4 oz (104.4 kg)   SpO2 96%   BMI 33.04 kg/m    CC: CPE Subjective:    Patient ID: Erik Smith, male    DOB: [birth year], 52 y.o.   MRN: [id number]  HPI: Nuchem Grattan is a 52 y.o. male presenting on 05/02/2018 for Annual Exam    Preventative: Colonoscopy - 1999 for blood in stool, normal. iFOB normal 2018. Discussed, would like to continue iFOB.  Prostate cancer screening - strong stream, no nocturia. + fmhx prostate cancer - paternal grandfather at older age. Yearly.  Flu yearly Tdap 2016 Seat belt use discussed Sunscreen use discussed. No changing moles on skin Smoking - non smoker Alcohol - 1 mixed drink + glass of wine - planning to cut down Dentist overdue  Eye exam yearly   Lives with wife and son, cat Occ: 5th grade at [employer] Activity: no regular exercise - has membership to gym  Diet: Not a lot of fast food, good water, fruits/vegetables daily     Relevant past medical, surgical, family and social history reviewed and updated as indicated. Interim medical history since our last visit reviewed. Allergies and medications reviewed and updated. Outpatient Medications Prior to Visit  Medication Sig Dispense Refill  . fluticasone (FLONASE) 50 MCG/ACT nasal spray Place 2 sprays into both nostrils daily. 16 g 1  . ibuprofen (ADVIL,MOTRIN) 200 MG tablet Take 800 mg by mouth every 6 (six) hours as needed.    . lithium carbonate (LITHOBID) 300 MG CR tablet Take 2 tablets (600 mg total) by mouth 2 (two) times daily. 120 tablet 2  . valACYclovir (VALTREX) 500 MG tablet TAKE 1 TABLET BY MOUTH TWICE DAILY. TAKE 3 DAYS AT A TIME 30 tablet 0  . guaiFENesin-codeine (CHERATUSSIN AC) 100-10 MG/5ML syrup Take 5 mLs by mouth 2 (two) times daily as needed for cough (sedation precautions). 120 mL 0   No facility-administered  medications prior to visit.      Per HPI unless specifically indicated in ROS section below Review of Systems  Constitutional: Negative for activity change, appetite change, chills, fatigue, fever and unexpected weight change.  HENT: Negative for hearing loss.   Eyes: Negative for visual disturbance.  Respiratory: Positive for cough (improved). Negative for chest tightness, shortness of breath and wheezing.   Cardiovascular: Negative for chest pain, palpitations and leg swelling.  Gastrointestinal: Negative for abdominal distention, abdominal pain, blood in stool, constipation, diarrhea, nausea and vomiting.  Genitourinary: Negative for difficulty urinating and hematuria.  Musculoskeletal: Negative for arthralgias, myalgias and neck pain.  Skin: Negative for rash.  Neurological: Negative for dizziness, seizures, syncope and headaches.  Hematological: Negative for adenopathy. Does not bruise/bleed easily.  Psychiatric/Behavioral: Negative for dysphoric mood. The patient is not nervous/anxious.    Objective:    BP 120/84 (BP Location: Left Arm, Patient Position: Sitting, Cuff Size: Large)   Pulse 72   Temp 98.2 F (36.8 C) (Oral)   Ht 5\' 10"  (1.778 m)   Wt 230 lb 4 oz (104.4 kg)   SpO2 96%   BMI 33.04 kg/m   Wt Readings from Last 3 Encounters:  05/02/18 230 lb 4 oz (104.4 kg)  04/01/18 224 lb 4 oz (101.7 kg)  03/28/18 226 lb 10.1 oz (102.8 kg)    Physical Exam Vitals signs  and nursing note reviewed.  Constitutional:      General: He is not in acute distress.    Appearance: He is well-developed.  HENT:     Head: Normocephalic and atraumatic.     Right Ear: Hearing, tympanic membrane, ear canal and external ear normal.     Left Ear: Hearing, tympanic membrane, ear canal and external ear normal.     Nose: Nose normal.     Mouth/Throat:     Pharynx: Uvula midline. No oropharyngeal exudate or posterior oropharyngeal erythema.  Eyes:     General: No scleral icterus.     Conjunctiva/sclera: Conjunctivae normal.     Pupils: Pupils are equal, round, and reactive to light.  Neck:     Musculoskeletal: Normal range of motion and neck supple.  Cardiovascular:     Rate and Rhythm: Normal rate and regular rhythm.     Pulses:          Radial pulses are 2+ on the right side and 2+ on the left side.     Heart sounds: Normal heart sounds. No murmur.  Pulmonary:     Effort: Pulmonary effort is normal. No respiratory distress.     Breath sounds: Normal breath sounds. No wheezing or rales.  Abdominal:     General: Bowel sounds are normal. There is no distension.     Palpations: Abdomen is soft. There is no mass.     Tenderness: There is no abdominal tenderness. There is no guarding or rebound.  Genitourinary:    Prostate: Normal. Not enlarged (20gm), not tender and no nodules present.     Rectum: Normal. No mass, tenderness, anal fissure, external hemorrhoid or internal hemorrhoid. Normal anal tone.  Musculoskeletal: Normal range of motion.  Lymphadenopathy:     Cervical: No cervical adenopathy.  Skin:    General: Skin is warm and dry.     Findings: No rash.  Neurological:     Mental Status: He is alert and oriented to person, place, and time.     Comments: CN grossly intact, station and gait intact  Psychiatric:        Behavior: Behavior normal.        Thought Content: Thought content normal.        Judgment: Judgment normal.       Results for orders placed or performed in visit on 04/29/18  TSH  Result Value Ref Range   TSH 1.44 0.35 - 4.50 uIU/mL  Comprehensive metabolic panel  Result Value Ref Range   Sodium 140 135 - 145 mEq/L   Potassium 4.8 3.5 - 5.1 mEq/L   Chloride 105 96 - 112 mEq/L   CO2 27 19 - 32 mEq/L   Glucose, Bld 97 70 - 99 mg/dL   BUN 18 6 - 23 mg/dL   Creatinine, Ser 1.01 0.40 - 1.50 mg/dL   Total Bilirubin 0.5 0.2 - 1.2 mg/dL   Alkaline Phosphatase 74 39 - 117 U/L   AST 24 0 - 37 U/L   ALT 35 0 - 53 U/L   Total Protein 6.8 6.0  - 8.3 g/dL   Albumin 4.4 3.5 - 5.2 g/dL   Calcium 9.5 8.4 - 10.5 mg/dL   GFR 81.85 >60.00 mL/min  Lipid panel  Result Value Ref Range   Cholesterol 165 0 - 200 mg/dL   Triglycerides 55.0 0.0 - 149.0 mg/dL   HDL 59.10 >39.00 mg/dL   VLDL 11.0 0.0 - 40.0 mg/dL   LDL Cholesterol 95 0 -  99 mg/dL   Total CHOL/HDL Ratio 3    NonHDL 106.21   PSA  Result Value Ref Range   PSA 0.24 0.10 - 4.00 ng/mL  Lithium level  Result Value Ref Range   Lithium Lvl 0.5 (L) 0.6 - 1.2 mmol/L   Assessment & Plan:   Problem List Items Addressed This Visit    Recurrent genital HSV (herpes simplex virus) infection    Treats with valtrex 500mg  bid 3d PRN flares, about 2/year. Refilled today.       Relevant Medications   valACYclovir (VALTREX) 500 MG tablet   Obesity, Class I, BMI 30-34.9    Weight gain noted. Encouraged healthy diet and lifestyle changes to affect sustainable weight loss. Pt motivated to restart ketogenic diet.       Healthcare maintenance - Primary    Preventative protocols reviewed and updated unless pt declined. Discussed healthy diet and lifestyle.       Bipolar disorder, unspecified ([place])    Lithium levels low but stable. Normal Cr and TSH. Pt feels this is effective. Has tried and failed other treatments including depakote and antidepressants, desires to continue lithium.        Other Visit Diagnoses    Special screening for malignant neoplasms, colon       Relevant Orders   Fecal occult blood, imunochemical       Meds ordered this encounter  Medications  . lithium carbonate (LITHOBID) 300 MG CR tablet    Sig: Take 1 tablet (300 mg total) by mouth 2 (two) times daily.    Dispense:  180 tablet    Refill:  3    Last seen 06/2015. Needs office visit prior to more refills  . valACYclovir (VALTREX) 500 MG tablet    Sig: TAKE 1 TABLET BY MOUTH TWICE DAILY. TAKE 3 DAYS AT A TIME    Dispense:  30 tablet    Refill:  1   Orders Placed This Encounter  Procedures  . Fecal  occult blood, imunochemical    Standing Status:   Future    Standing Expiration Date:   05/03/2019    Follow up plan: Return for annual exam, prior fasting for blood work.  Ria Bush, MD

## 2018-05-01 NOTE — Assessment & Plan Note (Signed)
Preventative protocols reviewed and updated unless pt declined. Discussed healthy diet and lifestyle.  

## 2018-05-02 ENCOUNTER — Encounter: Payer: Self-pay | Admitting: Family Medicine

## 2018-05-02 ENCOUNTER — Ambulatory Visit (INDEPENDENT_AMBULATORY_CARE_PROVIDER_SITE_OTHER): Payer: BC Managed Care – PPO | Admitting: Family Medicine

## 2018-05-02 VITALS — BP 120/84 | HR 72 | Temp 98.2°F | Ht 70.0 in | Wt 230.2 lb

## 2018-05-02 DIAGNOSIS — A6000 Herpesviral infection of urogenital system, unspecified: Secondary | ICD-10-CM

## 2018-05-02 DIAGNOSIS — F319 Bipolar disorder, unspecified: Secondary | ICD-10-CM

## 2018-05-02 DIAGNOSIS — Z0000 Encounter for general adult medical examination without abnormal findings: Secondary | ICD-10-CM

## 2018-05-02 DIAGNOSIS — Z1211 Encounter for screening for malignant neoplasm of colon: Secondary | ICD-10-CM

## 2018-05-02 DIAGNOSIS — E669 Obesity, unspecified: Secondary | ICD-10-CM

## 2018-05-02 MED ORDER — LITHIUM CARBONATE ER 300 MG PO TBCR: 300.0000 mg | EXTENDED_RELEASE_TABLET | Freq: Two times a day (BID) | ORAL | 3 refills | Status: DC

## 2018-05-02 MED ORDER — VALACYCLOVIR HCL 500 MG PO TABS: ORAL_TABLET | ORAL | 1 refills | Status: DC

## 2018-05-02 NOTE — Assessment & Plan Note (Addendum)
Treats with valtrex 500mg  bid 3d PRN flares, about 2/year. Refilled today.

## 2018-05-02 NOTE — Assessment & Plan Note (Signed)
Weight gain noted. Encouraged healthy diet and lifestyle changes to affect sustainable weight loss. Pt motivated to restart ketogenic diet.

## 2018-05-02 NOTE — Assessment & Plan Note (Signed)
Lithium levels low but stable. Normal Cr and TSH. Pt feels this is effective. Has tried and failed other treatments including depakote and antidepressants, desires to continue lithium.

## 2018-05-02 NOTE — Patient Instructions (Addendum)
Pass by lab to pick up stool kit. You are doing well today. Continue current medicines. Return as needed or in 1 year for next wellness visit.   Health Maintenance, Male A healthy lifestyle and preventive care is important for your health and wellness. Ask your health care provider about what schedule of regular examinations is right for you. What should I know about weight and diet? Eat a Healthy Diet  Eat plenty of vegetables, fruits, whole grains, low-fat dairy products, and lean protein.  Do not eat a lot of foods high in solid fats, added sugars, or salt.  Maintain a Healthy Weight Regular exercise can help you achieve or maintain a healthy weight. You should:  Do at least 150 minutes of exercise each week. The exercise should increase your heart rate and make you sweat (moderate-intensity exercise).  Do strength-training exercises at least twice a week. Watch Your Levels of Cholesterol and Blood Lipids  Have your blood tested for lipids and cholesterol every 5 years starting at 53 years of age. If you are at high risk for heart disease, you should start having your blood tested when you are 53 years old. You may need to have your cholesterol levels checked more often if: ? Your lipid or cholesterol levels are high. ? You are older than 53 years of age. ? You are at high risk for heart disease. What should I know about cancer screening? Many types of cancers can be detected early and may often be prevented. Lung Cancer  You should be screened every year for lung cancer if: ? You are a current smoker who has smoked for at least 30 years. ? You are a former smoker who has quit within the past 15 years.  Talk to your health care provider about your screening options, when you should start screening, and how often you should be screened. Colorectal Cancer  Routine colorectal cancer screening usually begins at 53 years of age and should be repeated every 5-10 years until you are  53 years old. You may need to be screened more often if early forms of precancerous polyps or small growths are found. Your health care provider may recommend screening at an earlier age if you have risk factors for colon cancer.  Your health care provider may recommend using home test kits to check for hidden blood in the stool.  A small camera at the end of a tube can be used to examine your colon (sigmoidoscopy or colonoscopy). This checks for the earliest forms of colorectal cancer. Prostate and Testicular Cancer  Depending on your age and overall health, your health care provider may do certain tests to screen for prostate and testicular cancer.  Talk to your health care provider about any symptoms or concerns you have about testicular or prostate cancer. Skin Cancer  Check your skin from head to toe regularly.  Tell your health care provider about any new moles or changes in moles, especially if: ? There is a change in a mole's size, shape, or color. ? You have a mole that is larger than a pencil eraser.  Always use sunscreen. Apply sunscreen liberally and repeat throughout the day.  Protect yourself by wearing long sleeves, pants, a wide-brimmed hat, and sunglasses when outside. What should I know about heart disease, diabetes, and high blood pressure?  If you are 73-105 years of age, have your blood pressure checked every 3-5 years. If you are 1 years of age or older, have your blood  pressure checked every year. You should have your blood pressure measured twice-once when you are at a hospital or clinic, and once when you are not at a hospital or clinic. Record the average of the two measurements. To check your blood pressure when you are not at a hospital or clinic, you can use: ? An automated blood pressure machine at a pharmacy. ? A home blood pressure monitor.  Talk to your health care provider about your target blood pressure.  If you are between 48-50 years old, ask your  health care provider if you should take aspirin to prevent heart disease.  Have regular diabetes screenings by checking your fasting blood sugar level. ? If you are at a normal weight and have a low risk for diabetes, have this test once every three years after the age of 13. ? If you are overweight and have a high risk for diabetes, consider being tested at a younger age or more often.  A one-time screening for abdominal aortic aneurysm (AAA) by ultrasound is recommended for men aged 6-75 years who are current or former smokers. What should I know about preventing infection? Hepatitis B If you have a higher risk for hepatitis B, you should be screened for this virus. Talk with your health care provider to find out if you are at risk for hepatitis B infection. Hepatitis C Blood testing is recommended for:  Everyone born from [birth year] through 1966.  Anyone with known risk factors for hepatitis C. Sexually Transmitted Diseases (STDs)  You should be screened each year for STDs including gonorrhea and chlamydia if: ? You are sexually active and are younger than 53 years of age. ? You are older than 53 years of age and your health care provider tells you that you are at risk for this type of infection. ? Your sexual activity has changed since you were last screened and you are at an increased risk for chlamydia or gonorrhea. Ask your health care provider if you are at risk.  Talk with your health care provider about whether you are at high risk of being infected with HIV. Your health care provider may recommend a prescription medicine to help prevent HIV infection. What else can I do?  Schedule regular health, dental, and eye exams.  Stay current with your vaccines (immunizations).  Do not use any tobacco products, such as cigarettes, chewing tobacco, and e-cigarettes. If you need help quitting, ask your health care provider.  Limit alcohol intake to no more than 2 drinks per day. One drink  equals 12 ounces of beer, 5 ounces of wine, or 1 ounces of hard liquor.  Do not use street drugs.  Do not share needles.  Ask your health care provider for help if you need support or information about quitting drugs.  Tell your health care provider if you often feel depressed.  Tell your health care provider if you have ever been abused or do not feel safe at home. This information is not intended to replace advice given to you by your health care provider. Make sure you discuss any questions you have with your health care provider. Document Released: 10/21/2007 Document Revised: 12/22/2015 Document Reviewed: 01/26/2015 Elsevier Interactive Patient Education  2019 [employer].

## 2018-05-21 ENCOUNTER — Other Ambulatory Visit (INDEPENDENT_AMBULATORY_CARE_PROVIDER_SITE_OTHER): Payer: BC Managed Care – PPO

## 2018-05-21 DIAGNOSIS — Z1211 Encounter for screening for malignant neoplasm of colon: Secondary | ICD-10-CM

## 2018-05-21 LAB — FECAL OCCULT BLOOD, IMMUNOCHEMICAL: Fecal Occult Bld: NEGATIVE

## 2018-05-21 LAB — FECAL OCCULT BLOOD, GUAIAC: Fecal Occult Blood: NEGATIVE

## 2018-05-22 ENCOUNTER — Encounter: Payer: Self-pay | Admitting: Family Medicine

## 2018-05-22 ENCOUNTER — Telehealth: Payer: Self-pay

## 2018-05-22 NOTE — Telephone Encounter (Signed)
Left message for patient to call back in regards to results

## 2018-09-11 ENCOUNTER — Ambulatory Visit (INDEPENDENT_AMBULATORY_CARE_PROVIDER_SITE_OTHER): Payer: BC Managed Care – PPO | Admitting: Family Medicine

## 2018-09-11 ENCOUNTER — Encounter: Payer: Self-pay | Admitting: Family Medicine

## 2018-09-11 DIAGNOSIS — F3132 Bipolar disorder, current episode depressed, moderate: Secondary | ICD-10-CM | POA: Diagnosis not present

## 2018-09-11 MED ORDER — CITALOPRAM HYDROBROMIDE 10 MG PO TABS: 10.0000 mg | ORAL_TABLET | Freq: Every day | ORAL | 6 refills | Status: DC

## 2018-09-11 NOTE — Assessment & Plan Note (Signed)
Chronic, deteriorated with increased stressors in his life. Noticing increasing irritability and increased depressed mood. Will continue lithium 300mg  BID (increased dose previously caused GI distress) and start celexa 10mg  daily. Reviewed common side effects and monitoring for increased suicidality. He will update me in 2-3 wks with how he's doing, sooner if needed. Recommended 1 mo f/u visit.

## 2018-09-11 NOTE — Progress Notes (Signed)
Virtual visit completed through [employer]. Due to national recommendations of social distancing due to [facility], a virtual visit is felt to be most appropriate for this patient at this time.   Patient location: in car Provider location: [place] at [facility] [place], office If any vitals were documented, they were collected by patient at home unless specified below.    There were no vitals taken for this visit.   CC: discuss mood Subjective:    Patient ID: Erik Smith, male    DOB: 09-28-1965, 53 y.o.   MRN: [id number]  HPI: Erik Smith is a 53 y.o. male presenting on 09/11/2018 for Mood Issues (C/o mood issues despite meds. Says there are days where it is hard to function. )   Bipolar disorder dx by psychiatry (~2012) on lithium 300mg  bid and well controlled previously. Tried and failed other treatments including depakote and antidepressants (celexa and lexapro) and risperdal.   Notes increased irritability over last 2-3 weeks. Notes increasing depressed mood that is getting longer and more severe as time goes on. Trouble falling and staying asleep.  Partner notes he's more angry.  Denies manic symptoms.  Thinks current life stressors are overwhelming - [phone]. 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 He is a social person, has struggled with social isolation changes.   Alcohol - on average 2-3 drinks a day.  No rec drugs.  No SI/HI.   He is compliant with lithium 300mg  bid, has tried up to 3 times a day without benefit.  He feels he is staying active going outdoors to exercise as stress relieving strategy.      Relevant past medical, surgical, family and social history reviewed and updated as indicated. Interim medical history since our last visit reviewed. Allergies and medications reviewed and updated. Outpatient Medications Prior to Visit  Medication Sig Dispense Refill  . ibuprofen (ADVIL,MOTRIN) 200 MG tablet Take 800 mg by mouth every 6 (six)  hours as needed.    . lithium carbonate (LITHOBID) 300 MG CR tablet Take 1 tablet (300 mg total) by mouth 2 (two) times daily. 180 tablet 3  . valACYclovir (VALTREX) 500 MG tablet TAKE 1 TABLET BY MOUTH TWICE DAILY. TAKE 3 DAYS AT A TIME (Patient taking differently: TAKE 1 TABLET BY MOUTH TWICE DAILY. TAKE 3 DAYS AT A TIME AS NEEDED) 30 tablet 1  . fluticasone (FLONASE) 50 MCG/ACT nasal spray Place 2 sprays into both nostrils daily. 16 g 1   No facility-administered medications prior to visit.      Per HPI unless specifically indicated in ROS section below Review of Systems Objective:    There were no vitals taken for this visit.  Wt Readings from Last 3 Encounters:  05/02/18 230 lb 4 oz (104.4 kg)  04/01/18 224 lb 4 oz (101.7 kg)  03/28/18 226 lb 10.1 oz (102.8 kg)     Physical exam: Gen: alert, NAD, not ill appearing Pulm: speaks in complete sentences without increased work of breathing Psych: normal mood, normal thought content      Lab Results  Component Value Date   LITHIUM 0.5 (L) 04/30/2018   NA 140 04/29/2018   BUN 18 04/29/2018   CREATININE 1.01 04/29/2018   TSH 1.44 04/29/2018   WBC 6.4 12/14/2016    Depression screen PHQ 2/9 09/11/2018 05/02/2018  Decreased Interest 0 0  Down, Depressed, Hopeless 2 0  PHQ - 2 Score 2 0  Altered sleeping 2 -  Tired, decreased  energy 1 -  Change in appetite 1 -  Feeling bad or failure about yourself  1 -  Trouble concentrating 0 -  Moving slowly or fidgety/restless 0 -  Suicidal thoughts 0 -  PHQ-9 Score 7 -    GAD 7 : Generalized Anxiety Score 09/11/2018  Nervous, Anxious, on Edge 1  Control/stop worrying 1  Worry too much - different things 1  Trouble relaxing 2  Restless 1  Easily annoyed or irritable 3  Afraid - awful might happen 1  Total GAD 7 Score 10   Lab Results  Component Value Date   TSH 1.44 04/29/2018    Lab Results  Component Value Date   CREATININE 1.01 04/29/2018   BUN 18 04/29/2018   NA 140  04/29/2018   K 4.8 04/29/2018   CL 105 04/29/2018   CO2 27 04/29/2018     Assessment & Plan:   Problem List Items Addressed This Visit    Bipolar disorder, unspecified ([place]) - Primary    Chronic, deteriorated with increased stressors in his life. Noticing increasing irritability and increased depressed mood. Will continue lithium 300mg  BID (increased dose previously caused GI distress) and start celexa 10mg  daily. Reviewed common side effects and monitoring for increased suicidality. He will update me in 2-3 wks with how he's doing, sooner if needed. Recommended 1 mo f/u visit.           Meds ordered this encounter  Medications  . citalopram (CELEXA) 10 MG tablet    Sig: Take 1 tablet (10 mg total) by mouth daily.    Dispense:  30 tablet    Refill:  6   No orders of the defined types were placed in this encounter.   Follow up plan: Return in about 4 weeks (around 10/09/2018) for follow up visit.  Ria Bush, MD

## 2018-09-24 ENCOUNTER — Other Ambulatory Visit: Payer: Self-pay

## 2018-09-24 ENCOUNTER — Encounter (HOSPITAL_COMMUNITY): Payer: Self-pay | Admitting: Emergency Medicine

## 2018-09-24 ENCOUNTER — Telehealth: Payer: Self-pay

## 2018-09-24 ENCOUNTER — Emergency Department (HOSPITAL_COMMUNITY)
Admission: EM | Admit: 2018-09-24 | Discharge: 2018-09-24 | Disposition: A | Discharge status: Home / Self Care | Payer: BC Managed Care – PPO | Attending: Emergency Medicine | Admitting: Emergency Medicine

## 2018-09-24 DIAGNOSIS — J45909 Unspecified asthma, uncomplicated: Secondary | ICD-10-CM | POA: Diagnosis not present

## 2018-09-24 DIAGNOSIS — Z79899 Other long term (current) drug therapy: Secondary | ICD-10-CM | POA: Diagnosis not present

## 2018-09-24 DIAGNOSIS — F309 Manic episode, unspecified: Secondary | ICD-10-CM

## 2018-09-24 DIAGNOSIS — F308 Other manic episodes: Secondary | ICD-10-CM | POA: Diagnosis not present

## 2018-09-24 DIAGNOSIS — F319 Bipolar disorder, unspecified: Secondary | ICD-10-CM | POA: Diagnosis not present

## 2018-09-24 LAB — COMPREHENSIVE METABOLIC PANEL
ALT: 26 U/L (ref 0–44)
AST: 20 U/L (ref 15–41)
Albumin: 4.4 g/dL (ref 3.5–5.0)
Alkaline Phosphatase: 69 U/L (ref 38–126)
Anion gap: 8 (ref 5–15)
BUN: 20 mg/dL (ref 6–20)
CO2: 26 mmol/L (ref 22–32)
Calcium: 9.4 mg/dL (ref 8.9–10.3)
Chloride: 105 mmol/L (ref 98–111)
Creatinine, Ser: 0.97 mg/dL (ref 0.61–1.24)
GFR calc Af Amer: >60 mL/min (ref >60)
GFR calc non Af Amer: >60 mL/min (ref >60)
Glucose, Bld: 94 mg/dL (ref 70–99)
Potassium: 4.2 mmol/L (ref 3.5–5.1)
Sodium: 139 mmol/L (ref 135–145)
Total Bilirubin: 0.8 mg/dL (ref 0.3–1.2)
Total Protein: 7.4 g/dL (ref 6.5–8.1)

## 2018-09-24 LAB — CBC WITH DIFFERENTIAL/PLATELET
Abs Immature Granulocytes: 0.04 10*3/uL (ref 0.00–0.07)
Basophils Absolute: 0.1 10*3/uL (ref 0.0–0.1)
Basophils Relative: 1 %
Eosinophils Absolute: 0.2 10*3/uL (ref 0.0–0.5)
Eosinophils Relative: 2 %
HCT: 44.8 % (ref 39.0–52.0)
Hemoglobin: 15.2 g/dL (ref 13.0–17.0)
Immature Granulocytes: 1 %
Lymphocytes Relative: 20 %
Lymphs Abs: 1.7 10*3/uL (ref 0.7–4.0)
MCH: 30 pg (ref 26.0–34.0)
MCHC: 33.9 g/dL (ref 30.0–36.0)
MCV: 88.4 fL (ref 80.0–100.0)
Monocytes Absolute: 0.8 10*3/uL (ref 0.1–1.0)
Monocytes Relative: 9 %
Neutro Abs: 5.7 10*3/uL (ref 1.7–7.7)
Neutrophils Relative %: 67 %
Platelets: 257 10*3/uL (ref 150–400)
RBC: 5.07 MIL/uL (ref 4.22–5.81)
RDW: 13.2 % (ref 11.5–15.5)
WBC: 8.5 10*3/uL (ref 4.0–10.5)
nRBC: 0 % (ref 0.0–0.2)

## 2018-09-24 LAB — LITHIUM LEVEL: Lithium Lvl: 0.31 mmol/L — ABNORMAL LOW (ref 0.60–1.20)

## 2018-09-24 LAB — ETHANOL: Alcohol, Ethyl (B): <10 mg/dL (ref <10)

## 2018-09-24 LAB — RAPID URINE DRUG SCREEN, HOSP PERFORMED
Amphetamines: NOT DETECTED
Barbiturates: NOT DETECTED
Benzodiazepines: NOT DETECTED
Cocaine: NOT DETECTED
Opiates: NOT DETECTED
Tetrahydrocannabinol: NOT DETECTED

## 2018-09-24 NOTE — BH Assessment (Addendum)
Tele Assessment Note   Patient Name: Erik Smith MRN: [id number] Referring Physician: Deno Etienne, DO Location of Patient: [facility] Location of Provider: [place] Department  Zackery Brine is a 53 y.o. male who presents voluntarily to [facility] with concerns of increased manic sx. Pt was brought to ED by significant other, [name] (who is a Marine scientist).  Pt has a history of rapid cycling Bipolar disorder. He was diagnosed after a suicide attempt about 20 years ago. Since that time pt has maintained well on lithium, titrating as needed. 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  Pt states he believes the increase in manic symptoms is related to the addition of this antidepressant. Pt denies current suicidal ideation. Pt denies symptoms of Depression aside from insomnia. Pt denies homicidal ideation/ history of violence. Pt denies AVH & other psychotic symptoms. Pt lives with long-time girlfriend [name] & 45 year old son. Supports include [name]. Pt denies hx of abuse and trauma. Pt's work history includes teaching. Pt has good insight and judgment. Pt's memory is intact. Legal history includes no charges. ? Pt's OP history includes psychologist, Pervis Hocking. IP history includes admission in [place] after suicide attempt about 20 years ago. Pt  alcohol/ substance abuse. ? MSE: Pt is dressed in scrubs, alert, oriented x4 with somewhat rapid & pressured speech and normal motor behavior. Eye contact is good. Pt's mood is apprehensive and affect is anxious. Affect is congruent with mood. Thought process is coherent and relevant. There is no indication pt is currently responding to internal stimuli or experiencing delusional thought content. Pt was cooperative throughout assessment.    Diagnosis: Bipolar Disorder Disposition: Marvia Pickles, NP recommends discharge from ED & pt follow up outpt as needed.    Past Medical History:   Past Medical History:  Diagnosis Date  . Arthritis   . Asthma    as a child  . Bipolar disorder, unspecified ([place])    hospitalization 2004 for depression, bipolar, ambien overdose  . Cholesteatoma 05/2010   s/p R ear surgery, residual tinnitus, planning on L ear surgery  . Depression    bipolar  . Family history of ischemic heart disease   . History of asthma   . Migraine without aura, without mention of intractable migraine without mention of status migrainosus   . Spinal stenosis   . Unspecified adjustment reaction     Past Surgical History:  Procedure Laterality Date  . CARPOMETACARPEL SUSPENSION PLASTY Left 10/18/2017   Procedure: LEFT THUMB TRAPEZIECTOMY AND SUSPENSIONPLASTY;  Surgeon: Leanora Cover, MD;  Location: [place];  Service: Orthopedics;  Laterality: Left;  . CARPOMETACARPEL SUSPENSION PLASTY Right 03/28/2018   Procedure: CARPOMETACARPEL ([place]) SUSPENSION PLASTY AND TRAPEZIECTOMY;  Surgeon: Leanora Cover, MD;  Location: [place];  Service: Orthopedics;  Laterality: Right;  . COLONOSCOPY  1999   normal (in [place])  . ESOPHAGUS SURGERY  2003   torn esophagus  . [place], 2012, 2013   for cholesteatoma, bilateral first L then R  . NECK SURGERY  2002   spinal stenosis and arthritis  . perianal abscess  1998    Family History:  Family History  Problem Relation Age of Onset  . Coronary artery disease Father 43  . Sickle cell anemia Mother   . Asthma Mother        smoker  . Prostate cancer Paternal  Grandfather 80       deceased from prostate CA  . Lung cancer Paternal Grandmother 36       smoker  . Breast cancer Paternal Uncle   . Stroke Neg Hx   . Diabetes Neg Hx     Social History:  reports that he has never smoked. He has never used smokeless tobacco. He reports current alcohol use. He reports that he does not use drugs.  Additional Social History:  Alcohol / Drug Use Pain Medications: See  MAR Prescriptions: See MAR Over the Counter: See MAR History of alcohol / drug use?: Yes Substance #1 Name of Substance 1: alcohol 1 - Age of First Use: 16 1 - Amount (size/oz): 1-2 drinks 1 - Frequency: q hs 1 - Last Use / Amount: 09/23/18  CIWA: CIWA-Ar BP: (!) 155/97 Pulse Rate: (!) 105 COWS:    Allergies:  Allergies  Allergen Reactions  . Morphine And Related Itching    Home Medications: (Not in a hospital admission)   OB/GYN Status:  No LMP for male patient.  General Assessment Data Assessment unable to be completed: Yes Reason for not completing assessment: TTS machine in use currently Location of Assessment: [facility] ED TTS Assessment: In system Is this a Tele or Face-to-Face Assessment?: Tele Assessment Is this an Initial Assessment or a Re-assessment for this encounter?: Initial Assessment Patient Accompanied by:: Adult([name], partner) Permission Given to speak with another: Yes Name, Relationship and Phone Number: Lorenda Hatchet [phone] Language Other than English: No Living Arrangements: Other (Comment) What gender do you identify as?: Male Marital status: Long term relationship Living Arrangements: Spouse/significant other Admission Status: Voluntary Is patient capable of signing voluntary admission?: Yes Referral Source: MD Insurance type: [place] Living Arrangements: Spouse/significant other Name of Psychiatrist: none X 10 years Name of Therapist: psychologist, Pervis Hocking  Education Status Is patient currently in school?: No Is the patient employed, 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 : No Suicidal Intent: No Has patient had any suicidal intent within the past 6 months prior to admission? : No Is patient at risk for suicide?: Yes Suicidal Plan?: No Has patient had  any suicidal plan within the past 6 months prior to admission? : No What has been your use of drugs/alcohol within the last 12 months?: average 1-2 drinks daily Previous Attempts/Gestures: Yes(20 years ago) How many times?: 1 Other Self Harm Risks: Rapid cycling bipolar dx, previous attempt Triggers for Past Attempts: (divorce, med changes, couldn't sleep- overtook [place]) Intentional Self Injurious Behavior: None Family Suicide History: No Recent stressful life event(s): Other (Comment)(COVID, teaching online) Persecutory voices/beliefs?: No Depression: No Depression Symptoms: Insomnia Substance abuse history and/or treatment for substance abuse?: No Suicide prevention information given to non-admitted patients: Yes  Risk to Others within the past 6 months Homicidal Ideation: No Does patient have any lifetime risk of violence toward others beyond the six months prior to admission? : No Thoughts of Harm to Others: No Current Homicidal Intent: No Current Homicidal Plan: No Access to Homicidal Means: No History of harm to others?: No Assessment of Violence: None Noted Does patient have access to weapons?: No Criminal Charges Pending?: No Does patient have a court date: No Is patient on probation?: No  Psychosis Hallucinations: None noted Delusions: None noted  Mental Status Report Appearance/Hygiene: Unremarkable Eye  Contact: Good Motor Activity: Freedom of movement Speech: Pressured, Logical/coherent Level of Consciousness: Alert Mood: Anxious, Pleasant, Apprehensive Affect: Apprehensive Anxiety Level: Moderate Thought Processes: Coherent, Relevant Judgement: Unimpaired Orientation: Person, Place, Time, Situation Obsessive Compulsive Thoughts/Behaviors: None  Cognitive Functioning Concentration: Good Memory: Recent Intact, Remote Intact Is patient IDD: No Insight: Good Impulse Control: Good Appetite: Good Have you had any weight changes? : No Change Sleep:  Increased Total Hours of Sleep: 7 Vegetative Symptoms: None  ADLScreening [facility] Assessment Services) Patient's cognitive ability adequate to safely complete daily activities?: Yes Patient able to express need for assistance with ADLs?: Yes Independently performs ADLs?: Yes (appropriate for developmental age)  Prior Inpatient Therapy Prior Inpatient Therapy: Yes Prior Therapy Dates: 20 years ago Prior Therapy Facilty/Provider(s): [place] Reason for Treatment: suicide attempt- rapid cycling bipolar dx  Prior Outpatient Therapy Prior Outpatient Therapy: Yes Prior Therapy Dates: ongoing Prior Therapy Facilty/Provider(s): [facility] Reason for Treatment: stress & continued mngt of bipolar sx Does patient have an ACCT team?: No Does patient have Intensive In-House Services?  : No Does patient have [facility] services? : No Does patient have P4CC services?: No  ADL Screening (condition at time of admission) Patient's cognitive ability adequate to safely complete daily activities?: Yes Is the patient deaf or have difficulty hearing?: No Does the patient have difficulty seeing, even when wearing glasses/contacts?: No Does the patient have difficulty concentrating, remembering, or making decisions?: No Patient able to express need for assistance with ADLs?: Yes Does the patient have difficulty dressing or bathing?: No Independently performs ADLs?: Yes (appropriate for developmental age) Does the patient have difficulty walking or climbing stairs?: No Weakness of Legs: None Weakness of Arms/Hands: None  Home Assistive Devices/Equipment Home Assistive Devices/Equipment: Eyeglasses  Therapy Consults (therapy consults require a physician order) PT Evaluation Needed: No OT Evalulation Needed: No SLP Evaluation Needed: No Abuse/Neglect Assessment (Assessment to be complete while patient is alone) Abuse/Neglect Assessment Can Be Completed: Yes Physical Abuse: Denies Verbal Abuse:  Denies Sexual Abuse: Denies Exploitation of patient/patient's resources: Denies Self-Neglect: Denies Values / Beliefs Cultural Requests During Hospitalization: None Spiritual Requests During Hospitalization: None Consults Spiritual Care Consult Needed: No Social Work Consult Needed: No Regulatory affairs officer (For Healthcare) Does Patient Have a Medical Advance Directive?: No Would patient like information on creating a medical advance directive?: No - Patient declined          Disposition: Marvia Pickles, NP recommends discharge from ED & pt follow up outpt as needed Disposition Initial Assessment Completed for this Encounter: Yes  This service was provided via telemedicine using a 2-way, interactive audio and video technology.     Severo Beber Tora Perches 09/24/2018 5:37 PM

## 2018-09-24 NOTE — ED Provider Notes (Signed)
[place] DEPT Provider Note   CSN: [id number] Arrival date & time: 09/24/18  1447    History   Chief Complaint Chief Complaint  Patient presents with  . Medical Clearance    HPI Erik Smith is a 53 y.o. male.     53 yo M with a chief complaint of increased mania.  Patient was recently started on Celexa to elevate his mood.  He thinks maybe has gone too far and now he has been having some flights of ideas and he is.  He is recently feeling like he needs to do something.  He tried to go for hour and a half walk but it did not improve his symptoms.  He called his mental health provider who told him to come to the ED for evaluation.  He denies suicidal or homicidal ideation.  Denies cough congestion fever denies abdominal pain nausea vomiting or diarrhea denies headache or neck pain.  The history is provided by the patient.  Illness  Severity:  Mild Onset quality:  Sudden Duration:  2 days Timing:  Constant Progression:  Worsening Chronicity:  New Associated symptoms: no abdominal pain, no chest pain, no congestion, no diarrhea, no fever, no headaches, no myalgias, no rash, no shortness of breath and no vomiting     Past Medical History:  Diagnosis Date  . Arthritis   . Asthma    as a child  . Bipolar disorder, unspecified ([place])    hospitalization 2004 for depression, bipolar, ambien overdose  . Cholesteatoma 05/2010   s/p R ear surgery, residual tinnitus, planning on L ear surgery  . Depression    bipolar  . Family history of ischemic heart disease   . History of asthma   . Migraine without aura, without mention of intractable migraine without mention of status migrainosus   . Spinal stenosis   . Unspecified adjustment reaction     Patient Active Problem List   Diagnosis Date Noted  . Obesity, Class I, BMI 30-34.9 04/27/2018  . Chronic cough 04/01/2018  . Recurrent genital HSV (herpes simplex virus) infection 06/26/2015  . Right  knee pain 06/26/2015  . Injury of toe on left foot 11/12/2014  . Left shoulder pain 10/29/2013  . Healthcare maintenance 12/19/2010  . Bipolar disorder, unspecified ([place]) 01/14/2007  . COMMON MIGRAINE 01/14/2007  . TINNITUS, CHRONIC 01/14/2007  . NECK PAIN, CHRONIC 01/14/2007    Past Surgical History:  Procedure Laterality Date  . CARPOMETACARPEL SUSPENSION PLASTY Left 10/18/2017   Procedure: LEFT THUMB TRAPEZIECTOMY AND SUSPENSIONPLASTY;  Surgeon: Leanora Cover, MD;  Location: [place];  Service: Orthopedics;  Laterality: Left;  . CARPOMETACARPEL SUSPENSION PLASTY Right 03/28/2018   Procedure: CARPOMETACARPEL ([place]) SUSPENSION PLASTY AND TRAPEZIECTOMY;  Surgeon: Leanora Cover, MD;  Location: [place];  Service: Orthopedics;  Laterality: Right;  . COLONOSCOPY  1999   normal (in [place])  . ESOPHAGUS SURGERY  2003   torn esophagus  . [place], 2012, 2013   for cholesteatoma, bilateral first L then R  . NECK SURGERY  2002   spinal stenosis and arthritis  . perianal abscess  1998        Home Medications    Prior to Admission medications   Medication Sig Start Date End Date Taking? Authorizing Provider  citalopram (CELEXA) 10 MG tablet Take 1 tablet (10 mg total) by mouth daily. 09/11/18   Ria [facility], MD  ibuprofen (ADVIL,MOTRIN) 200 MG tablet Take 800 mg  by mouth every 6 (six) hours as needed.    [provider]  lithium carbonate (LITHOBID) 300 MG CR tablet Take 1 tablet (300 mg total) by mouth 2 (two) times daily. 05/02/18   Ria [facility], MD  valACYclovir (VALTREX) 500 MG tablet TAKE 1 TABLET BY MOUTH TWICE DAILY. TAKE 3 DAYS AT A TIME Patient taking differently: TAKE 1 TABLET BY MOUTH TWICE DAILY. TAKE 3 DAYS AT A TIME AS NEEDED 05/02/18   Ria [facility], MD    Family History Family History  Problem Relation Age of Onset  . Coronary artery disease Father 23  . Sickle cell anemia Mother   .  Asthma Mother        smoker  . Prostate cancer Paternal Grandfather 104       deceased from prostate CA  . Lung cancer Paternal Grandmother 49       smoker  . Breast cancer Paternal Uncle   . Stroke Neg Hx   . Diabetes Neg Hx     Social History Social History   Tobacco Use  . Smoking status: Never Smoker  . Smokeless tobacco: Never Used  Substance Use Topics  . Alcohol use: Yes    Alcohol/week: 0.0 standard drinks    Comment: 1 beer/day  . Drug use: No     Allergies   Morphine and related   Review of Systems Review of Systems  Constitutional: Negative for chills and fever.  HENT: Negative for congestion and facial swelling.   Eyes: Negative for discharge and visual disturbance.  Respiratory: Negative for shortness of breath.   Cardiovascular: Negative for chest pain and palpitations.  Gastrointestinal: Negative for abdominal pain, diarrhea and vomiting.  Musculoskeletal: Negative for arthralgias and myalgias.  Skin: Negative for color change and rash.  Neurological: Negative for tremors, syncope and headaches.  Psychiatric/Behavioral: Positive for decreased concentration. Negative for confusion, dysphoric mood and suicidal ideas. The patient is nervous/anxious.      Physical Exam Updated Vital Signs BP (!) 155/97   Pulse (!) 105   Temp 98.2 F (36.8 C) (Oral)   Resp 20   Ht 5\' 10"  (1.778 m)   Wt 95.3 kg   SpO2 99%   BMI 30.13 kg/m   Physical Exam Vitals signs and nursing note reviewed.  Constitutional:      Appearance: He is well-developed.  HENT:     Head: Normocephalic and atraumatic.  Eyes:     Pupils: Pupils are equal, round, and reactive to light.  Neck:     Musculoskeletal: Normal range of motion and neck supple.     Vascular: No JVD.  Cardiovascular:     Rate and Rhythm: Normal rate and regular rhythm.     Heart sounds: No murmur. No friction rub. No gallop.   Pulmonary:     Effort: No respiratory distress.     Breath sounds: No wheezing.   Abdominal:     General: There is no distension.     Tenderness: There is no guarding or rebound.  Musculoskeletal: Normal range of motion.  Skin:    Coloration: Skin is not pale.     Findings: No rash.  Neurological:     Mental Status: He is alert and oriented to person, place, and time.  Psychiatric:        Mood and Affect: Mood is anxious.        Behavior: Behavior is hyperactive.        Thought Content: Thought content does not include homicidal  or suicidal ideation. Thought content does not include homicidal or suicidal plan.      ED Treatments / Results  Labs (all labs ordered are listed, but only abnormal results are displayed) Labs Reviewed  LITHIUM LEVEL  COMPREHENSIVE METABOLIC PANEL  ETHANOL  RAPID URINE DRUG SCREEN, HOSP PERFORMED  CBC WITH DIFFERENTIAL/PLATELET    EKG None  Radiology No results found.  Procedures Procedures (including critical care time)  Medications Ordered in ED Medications - No data to display   Initial Impression / Assessment and Plan / ED Course  I have reviewed the triage vital signs and the nursing notes.  Pertinent labs & imaging results that were available during my care of the patient were reviewed by me and considered in my medical decision making (see chart for details).  Clinical Course as of Sep 23 1648  Tue Sep 24, 2018  1631 Manic, recently started seroquel, has BPD--TTS consulted, labs pending, potential discharge per tts   [MB]    Clinical Course User Index [MB] Maudie Flakes, MD       53 yo M with a chief complaint of Increased mania.  Patient has a history of bipolar disorder and was just started on Celexa.  Thinks that this may be a cause to symptoms.  Denies medical complaint.  I feel he is medically clear TTS evaluation.   The patients results and plan were reviewed and discussed.   Any x-rays performed were independently reviewed by myself.   Differential diagnosis were considered with the  presenting HPI.  Medications - No data to display  Vitals:   09/24/18 1504 09/24/18 1540  BP: (!) 155/97   Pulse: (!) 105   Resp: 20   Temp: 98.2 F (36.8 C)   TempSrc: Oral   SpO2: 99%   Weight:  95.3 kg  Height:  5\' 10"  (1.778 m)    Final diagnoses:  Mania ([place])    Admission/ observation were discussed with the admitting physician, patient and/or family and they are comfortable with the plan.   Final Clinical Impressions(s) / ED Diagnoses   Final diagnoses:  Mania [facility])    ED Discharge Orders    None       Deno Etienne, DO 09/24/18 1650

## 2018-09-24 NOTE — ED Triage Notes (Signed)
Reports rapid cycling with n/o bipolar. Recently started Celexa for depression and he thinks that it is causing elation. Denies SI/HI/AVH. Calm and Cooperative.

## 2018-09-24 NOTE — ED Notes (Signed)
Pt discharged home. Discharged instructions read to pt who verbalized understanding. All belongings returned to pt. Denies SI/HI, is not delusional and not responding to internal stimuli. Escorted pt to the ED exit.   

## 2018-09-24 NOTE — ED Provider Notes (Signed)
Assumed care from Dr. fluid.  Evaluated by TTS, deemed appropriate for discharge.  Labs are overall unremarkable, low lithium level.  Medication adjustments per behavioral health.  After the discussed management above, the patient was determined to be safe for discharge.  The patient was in agreement with this plan and all questions regarding their care were answered.  ED return precautions were discussed and the patient will return to the ED with any significant worsening of condition.   Maudie Flakes, MD 09/24/18 [phone]

## 2018-09-24 NOTE — Discharge Instructions (Signed)
You were evaluated in the Emergency Department and after careful evaluation, we did not find any emergent condition requiring admission or further testing in the hospital.  Please make the adjustments to your medications as discussed with the behavioral health experts.  Please return to the Emergency Department if you experience any worsening of your condition.  We encourage you to follow up with a primary care provider.  Thank you for allowing [place] to be a part of your care.

## 2018-09-24 NOTE — ED Notes (Addendum)
Pt continues to feel that he is on the verge of being hypomanic. He has remained calm throughout the visit.

## 2018-09-24 NOTE — Telephone Encounter (Signed)
Pt said he is not doing well with lithium and citalopram. Symptoms started 24 - 36 hrs ago; pt started citalopram 10 mg on 09/11/18 and takes one tab at night; pt takes lithium 300 mg one tab bid. So far today pt has taken one lithium 300 mg. Pt said today symptoms have worsened; pt said he has been pushed back into manic stage; pt said thoughts are just screaming; feels like chest wants to explode; feels like when on roller coaster and at the top and then suddenly drops; pt said it is hard to describe; pt is not having CP or tightness but feels like a big bubble in chest. Pt is SOB but he is out running trying to get rid of some energy. No SI/HI. No covid symptoms except diarrhea which pt thinks is related to taking lithium.by the time I finished talking with pt he had run 1 1/2 miles from his home; pt did not want 911 called and he tried to get in touch with a friend to come pick him up but pt had to walk back to his home(I stayed on phone with pt until he got home because he was still SOB) and the friend was there and will take pt to ED. Pt has not decided yet which ED he will go to but he will go to ED now. FYI to Dr Danise Mina.

## 2018-09-24 NOTE — Consult Note (Signed)
Telepsych Consultation   Reason for Consult:  Manic symptoms Referring Physician:  EDP Location of Patient:  Location of Provider: [facility]  Patient Identification: Erik Smith MRN:  [id number] Principal Diagnosis: <principal problem not specified> Diagnosis:  Active Problems:   * No active hospital problems. *   Total Time spent with patient: 30 minutes  Subjective:   Erik Smith is a 53 y.o. male patient presented to the ED at the [facility] reporting feeling very energetic and feeling as though he is having a manic episode.  Patient reports that he has bipolar and he is well-known to having ups and downs and does pay very good attention to his peaks and troughs of his bipolar disorder.  Patient reports that on 09/11/2018 his PCP started him on Celexa to help with some of his depression.  He reports that over the last 24 to 36 hours his anxiety, restlessness, and overwhelming energetic feeling has greatly increased.  He reports that he ran approximately a mile and a Smith trying to work the energy out and this did not work.  He stated that he contacted his doctor's office and they informed him to come to the emergency room to get evaluated.  Patient denies any suicidal or homicidal ideations and denies any hallucinations.  The patient states that at the current moment he feels as though things are starting to calm down some.  Patient states he does not feel that he needs to stay in the hospital and that his partner at home is a nurse and is aware of what to watch for and he lives approximately 5 minutes away from the hospital and if you feel that he needs to come back that he definitely would.  HPI:  53 y.o. male who presents voluntarily to [facility] with concerns of increased manic sx. Pt was brought to ED by significant other, Erik Smith (who is a Marine scientist).  Pt has a history of rapid cycling Bipolar disorder. He was diagnosed after a suicide attempt about 20 years ago. Since that time pt  has maintained well on lithium, titrating as needed. 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  Pt states he believes the increase in manic symptoms is related to the addition of this antidepressant. Pt denies current suicidal ideation. Pt denies symptoms of Depression aside from insomnia. Pt denies homicidal ideation/ history of violence. Pt denies AVH & other psychotic symptoms. Pt lives with long-time girlfriend Erik Smith & 33 year old son. Supports include Erik Smith. Pt denies hx of abuse and trauma. Pt's work history includes teaching. Pt has good insight and judgment. Pt's memory is intact. Legal history includes no charges.  Past Psychiatric History: Bipolar disorder  Risk to Self: Suicidal Ideation: No Suicidal Intent: No Is patient at risk for suicide?: Yes Suicidal Plan?: No What has been your use of drugs/alcohol within the last 12 months?: average 1-2 drinks daily How many times?: 1 Other Self Harm Risks: Rapid cycling bipolar dx, previous attempt Triggers for Past Attempts: (divorce, med changes, couldn't sleep- overtook [place]) Intentional Self Injurious Behavior: None Risk to Others: Homicidal Ideation: No Thoughts of Harm to Others: No Current Homicidal Intent: No Current Homicidal Plan: No Access to Homicidal Means: No History of harm to others?: No Assessment of Violence: None Noted Does patient have access to weapons?: No Criminal Charges Pending?: No Does patient have a court date: No Prior Inpatient  Therapy: Prior Inpatient Therapy: Yes Prior Therapy Dates: 20 years ago Prior Therapy Facilty/Provider(s): [place] Reason for Treatment: suicide attempt- rapid cycling bipolar dx Prior Outpatient Therapy: Prior Outpatient Therapy: Yes Prior Therapy Dates: ongoing Prior Therapy Facilty/Provider(s): [facility] Reason for Treatment: stress & continued mngt of bipolar sx Does patient have an ACCT  team?: No Does patient have Intensive In-House Services?  : No Does patient have [facility] services? : No Does patient have P4CC services?: No  Past Medical History:  Past Medical History:  Diagnosis Date  . Arthritis   . Asthma    as a child  . Bipolar disorder, unspecified ([place])    hospitalization 2004 for depression, bipolar, ambien overdose  . Cholesteatoma 05/2010   s/p R ear surgery, residual tinnitus, planning on L ear surgery  . Depression    bipolar  . Family history of ischemic heart disease   . History of asthma   . Migraine without aura, without mention of intractable migraine without mention of status migrainosus   . Spinal stenosis   . Unspecified adjustment reaction     Past Surgical History:  Procedure Laterality Date  . CARPOMETACARPEL SUSPENSION PLASTY Left 10/18/2017   Procedure: LEFT THUMB TRAPEZIECTOMY AND SUSPENSIONPLASTY;  Surgeon: Leanora Cover, MD;  Location: [place];  Service: Orthopedics;  Laterality: Left;  . CARPOMETACARPEL SUSPENSION PLASTY Right 03/28/2018   Procedure: CARPOMETACARPEL ([place]) SUSPENSION PLASTY AND TRAPEZIECTOMY;  Surgeon: Leanora Cover, MD;  Location: [facility];  Service: Orthopedics;  Laterality: Right;  . COLONOSCOPY  1999   normal (in [place])  . ESOPHAGUS SURGERY  2003   torn esophagus  . [place], 2012, 2013   for cholesteatoma, bilateral first L then R  . NECK SURGERY  2002   spinal stenosis and arthritis  . perianal abscess  1998   Family History:  Family History  Problem Relation Age of Onset  . Coronary artery disease Father 58  . Sickle cell anemia Mother   . Asthma Mother        smoker  . Prostate cancer Paternal Grandfather 10       deceased from prostate CA  . Lung cancer Paternal Grandmother 47       smoker  . Breast cancer Paternal Uncle   . Stroke Neg Hx   . Diabetes Neg Hx    Family Psychiatric  History: Denies Social History:  Social History    Substance and Sexual Activity  Alcohol Use Yes  . Alcohol/week: 0.0 standard drinks   Comment: 1 beer/day     Social History   Substance and Sexual Activity  Drug Use No    Social History   Socioeconomic History  . Marital status: Married    Spouse name: Not on file  . Number of children: 2  . Years of education: Not on file  . Highest education level: Not on file  Occupational History  . 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     Employer: [employer]  Social Needs  . Financial resource strain: Not on file  . Food insecurity:    Worry: Not on file    Inability: Not on file  . Transportation needs:    Medical: Not on file    Non-medical: Not on file  Tobacco Use  . Smoking status: Never Smoker  . Smokeless tobacco: Never Used  Substance and Sexual Activity  . Alcohol use: Yes    Alcohol/week: 0.0 standard drinks  Comment: 1 beer/day  . Drug use: No  . Sexual activity: Not on file  Lifestyle  . Physical activity:    Days per week: Not on file    Minutes per session: Not on file  . Stress: Not on file  Relationships  . Social connections:    Talks on phone: Not on file    Gets together: Not on file    Attends religious service: Not on file    Active member of club or organization: Not on file    Attends meetings of clubs or organizations: Not on file    Relationship status: Not on file  Other Topics Concern  . 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 Morphine And Related Itching    Labs:  Results for orders placed or performed during the hospital encounter of 09/24/18 (from the past 48 hour(s))  Lithium level     Status: Abnormal   Collection Time: 09/24/18  3:22 PM  Result Value Ref Range   Lithium Lvl 0.31 (L) 0.60 - 1.20 mmol/L     Comment: Performed at [facility], [place] [address], [place], [place] [address]  Comprehensive metabolic panel     Status: None   Collection Time: 09/24/18  3:22 PM  Result Value Ref Range   Sodium 139 135 - 145 mmol/L   Potassium 4.2 3.5 - 5.1 mmol/L   Chloride 105 98 - 111 mmol/L   CO2 26 22 - 32 mmol/L   Glucose, Bld 94 70 - 99 mg/dL   BUN 20 6 - 20 mg/dL   Creatinine, Ser 0.97 0.61 - 1.24 mg/dL   Calcium 9.4 8.9 - 10.3 mg/dL   Total Protein 7.4 6.5 - 8.1 g/dL   Albumin 4.4 3.5 - 5.0 g/dL   AST 20 15 - 41 U/L   ALT 26 0 - 44 U/L   Alkaline Phosphatase 69 38 - 126 U/L   Total Bilirubin 0.8 0.3 - 1.2 mg/dL   GFR calc non Af Amer >60 >60 mL/min   GFR calc Af Amer >60 >60 mL/min   Anion gap 8 5 - 15    Comment: Performed at [facility], [place] [address], [place], [place] [address]  Ethanol     Status: None   Collection Time: 09/24/18  3:22 PM  Result Value Ref Range   Alcohol, Ethyl (B) <10 <10 mg/dL    Comment: (NOTE) Lowest detectable limit for serum alcohol is 10 mg/dL. For medical purposes only. Performed at [facility], [place] [address] [place], [place] [address]   CBC with Diff     Status: None   Collection Time: 09/24/18  3:22 PM  Result Value Ref Range   WBC 8.5 4.0 - 10.5 K/uL   RBC 5.07 4.22 - 5.81 MIL/uL   Hemoglobin 15.2 13.0 - 17.0 g/dL   HCT 44.8 39.0 - 52.0 %   MCV 88.4 80.0 - 100.0 fL   MCH 30.0 26.0 - 34.0 pg   MCHC 33.9 30.0 - 36.0 g/dL   RDW 13.2 11.5 - 15.5 %   Platelets 257 150 - 400 K/uL   nRBC 0.0 0.0 - 0.2 %   Neutrophils Relative % 67 %  Neutro Abs 5.7 1.7 - 7.7 K/uL   Lymphocytes Relative 20 %   Lymphs Abs 1.7 0.7 - 4.0 K/uL   Monocytes Relative 9 %   Monocytes Absolute 0.8 0.1 - 1.0 K/uL   Eosinophils Relative 2 %   Eosinophils Absolute 0.2 0.0 - 0.5 K/uL   Basophils Relative 1 %   Basophils Absolute 0.1 0.0 - 0.1 K/uL   Immature Granulocytes 1 %   Abs Immature Granulocytes 0.04  0.00 - 0.07 K/uL    Comment: Performed at [facility], [place] [address] [place], [place] [address]  Urine rapid drug screen (hosp performed)     Status: None   Collection Time: 09/24/18  4:21 PM  Result Value Ref Range   Opiates NONE DETECTED NONE DETECTED   Cocaine NONE DETECTED NONE DETECTED   Benzodiazepines NONE DETECTED NONE DETECTED   Amphetamines NONE DETECTED NONE DETECTED   Tetrahydrocannabinol NONE DETECTED NONE DETECTED   Barbiturates NONE DETECTED NONE DETECTED    Comment: (NOTE) DRUG SCREEN FOR MEDICAL PURPOSES ONLY.  IF CONFIRMATION IS NEEDED FOR ANY PURPOSE, NOTIFY LAB WITHIN 5 DAYS. LOWEST DETECTABLE LIMITS FOR URINE DRUG SCREEN Drug Class                     Cutoff (ng/mL) Amphetamine and metabolites    1000 Barbiturate and metabolites    200 Benzodiazepine                 062 Tricyclics and metabolites     300 Opiates and metabolites        300 Cocaine and metabolites        300 THC                            50 Performed at [facility], [place] [address], [place], [place] [address]     Medications:  No current facility-administered medications for this encounter.    Current Outpatient Medications  Medication Sig Dispense Refill  . citalopram (CELEXA) 10 MG tablet Take 1 tablet (10 mg total) by mouth daily. 30 tablet 6  . ibuprofen (ADVIL,MOTRIN) 200 MG tablet Take 800 mg by mouth every 6 (six) hours as needed.    . lithium carbonate (LITHOBID) 300 MG CR tablet Take 1 tablet (300 mg total) by mouth 2 (two) times daily. 180 tablet 3  . valACYclovir (VALTREX) 500 MG tablet TAKE 1 TABLET BY MOUTH TWICE DAILY. TAKE 3 DAYS AT A TIME (Patient taking differently: TAKE 1 TABLET BY MOUTH TWICE DAILY. TAKE 3 DAYS AT A TIME AS NEEDED) 30 tablet 1    Musculoskeletal: Strength & Muscle Tone: within normal limits Gait & Station: normal Patient leans: N/A  Psychiatric Specialty Exam: Physical Exam  Nursing note and vitals  reviewed. Constitutional: He is oriented to person, place, and time. He appears well-developed and well-nourished.  Cardiovascular: Normal rate.  Respiratory: Effort normal.  Musculoskeletal: Normal range of motion.  Neurological: He is alert and oriented to person, place, and time.  Skin: Skin is warm.    Review of Systems  Constitutional: Negative.   HENT: Negative.   Eyes: Negative.   Respiratory: Negative.   Cardiovascular: Negative.   Gastrointestinal: Negative.   Genitourinary: Negative.   Musculoskeletal: Negative.   Skin: Negative.   Neurological: Negative.   Endo/Heme/Allergies: Negative.   Psychiatric/Behavioral: Negative for depression, hallucinations, substance abuse and suicidal ideas. The patient is nervous/anxious and has insomnia.  Blood pressure (!) 155/97, pulse (!) 105, temperature 98.2 F (36.8 C), temperature source Oral, resp. rate 20, height 5\' 10"  (1.778 m), weight 95.3 kg, SpO2 99 %.Body mass index is 30.13 kg/m.  General Appearance: Casual  Eye Contact:  Good  Speech:  Pressured  Volume:  Normal  Mood:  Anxious  Affect:  Congruent  Thought Process:  Coherent and Descriptions of Associations: Intact  Orientation:  Full (Time, Place, and Person)  Thought Content:  WDL  Suicidal Thoughts:  No  Homicidal Thoughts:  No  Memory:  Immediate;   Good Recent;   Good Remote;   NA  Judgement:  Good  Insight:  Good  Psychomotor Activity:  Increased and Restlessness  Concentration:  Concentration: Fair and Attention Span: Fair  Recall:  Good  Fund of Knowledge:  Good  Language:  Good  Akathisia:  No  Handed:  Right  AIMS (if indicated):     Assets:  Desire for Improvement Financial Resources/Insurance Housing Physical Health Resilience Social Support Transportation  ADL's:  Intact  Cognition:  WNL  Sleep:        Treatment Plan Summary: Discontinue use of Celexa May use one time dose of at home benadryl for sleep tonight Follow up with  outpatient resources Possibly use an antipsychotic such as Abilify for depressive and bipolar symptoms along with lithium  Disposition: No evidence of imminent risk to self or others at present.   Patient does not meet criteria for psychiatric inpatient admission. Supportive therapy provided about ongoing stressors. Discussed crisis plan, support from social network, calling 911, coming to the Emergency Department, and calling Suicide Hotline.  This service was provided via telemedicine using a 2-way, interactive audio and video technology.  Names of all persons participating in this telemedicine service and their role in this encounter. Name: [name] Role: Patient  Name: Marvia Pickles NP Role: Provider  Name:  Role:   Name:  Role:     Lewis Shock, FNP 09/24/2018 6:13 PM

## 2018-09-25 MED ORDER — LITHIUM CARBONATE ER 300 MG PO TBCR: EXTENDED_RELEASE_TABLET | ORAL | 1 refills | Status: DC

## 2018-09-25 MED ORDER — ARIPIPRAZOLE 5 MG PO TABS: 5.0000 mg | ORAL_TABLET | Freq: Every day | ORAL | 6 refills | Status: DC

## 2018-09-25 NOTE — Telephone Encounter (Signed)
Spoke with patient today, ER records reviewed.  Now off celexa. Will start abilify, will increase lithium to 3 a day, pt to update me with effect in 2-3 weeks.

## 2018-10-07 ENCOUNTER — Telehealth: Payer: Self-pay

## 2018-10-07 MED ORDER — ARIPIPRAZOLE 10 MG PO TABS: 10.0000 mg | ORAL_TABLET | Freq: Every day | ORAL | 6 refills | Status: DC

## 2018-10-07 NOTE — Telephone Encounter (Signed)
Thanks for update. Agree with this - let's start abilify 10mg  daily - may double up until runs out, I have sent in 10mg  dose to pharmacy.  Let me know if doing well, we can send in 90d supply next refill.

## 2018-10-07 NOTE — Telephone Encounter (Signed)
Since starting Abilify 5mg  he has been feeling a little better, less manic. Wife has said he has more energy and he notices it, too. Would like to talk about increasing the Abilify just to make sure he is not getting too manic.

## 2018-10-07 NOTE — Telephone Encounter (Signed)
Spoke with pt relaying Dr. Synthia Innocent message and instructions.  Pt verbalizes understanding and expresses his thanks.

## 2018-10-16 ENCOUNTER — Ambulatory Visit (INDEPENDENT_AMBULATORY_CARE_PROVIDER_SITE_OTHER): Payer: BC Managed Care – PPO | Admitting: Psychology

## 2018-10-16 DIAGNOSIS — F312 Bipolar disorder, current episode manic severe with psychotic features: Secondary | ICD-10-CM | POA: Diagnosis not present

## 2018-10-16 DIAGNOSIS — F4323 Adjustment disorder with mixed anxiety and depressed mood: Secondary | ICD-10-CM | POA: Diagnosis not present

## 2018-10-22 ENCOUNTER — Ambulatory Visit (INDEPENDENT_AMBULATORY_CARE_PROVIDER_SITE_OTHER): Payer: BC Managed Care – PPO | Admitting: Psychology

## 2018-10-22 DIAGNOSIS — F4323 Adjustment disorder with mixed anxiety and depressed mood: Secondary | ICD-10-CM | POA: Diagnosis not present

## 2018-10-22 DIAGNOSIS — F3112 Bipolar disorder, current episode manic without psychotic features, moderate: Secondary | ICD-10-CM | POA: Diagnosis not present

## 2018-11-21 ENCOUNTER — Ambulatory Visit (INDEPENDENT_AMBULATORY_CARE_PROVIDER_SITE_OTHER): Payer: BC Managed Care – PPO | Admitting: Psychology

## 2018-11-21 DIAGNOSIS — F3112 Bipolar disorder, current episode manic without psychotic features, moderate: Secondary | ICD-10-CM

## 2018-12-26 ENCOUNTER — Ambulatory Visit (INDEPENDENT_AMBULATORY_CARE_PROVIDER_SITE_OTHER): Payer: BC Managed Care – PPO | Admitting: Psychology

## 2018-12-26 DIAGNOSIS — F3112 Bipolar disorder, current episode manic without psychotic features, moderate: Secondary | ICD-10-CM

## 2019-01-28 ENCOUNTER — Ambulatory Visit (INDEPENDENT_AMBULATORY_CARE_PROVIDER_SITE_OTHER): Payer: BC Managed Care – PPO | Admitting: Psychology

## 2019-01-28 DIAGNOSIS — F3112 Bipolar disorder, current episode manic without psychotic features, moderate: Secondary | ICD-10-CM

## 2019-04-15 ENCOUNTER — Ambulatory Visit: Payer: BC Managed Care – PPO | Admitting: Family Medicine

## 2019-04-15 ENCOUNTER — Encounter: Payer: Self-pay | Admitting: Family Medicine

## 2019-04-15 ENCOUNTER — Other Ambulatory Visit: Payer: Self-pay

## 2019-04-15 DIAGNOSIS — N61 Mastitis without abscess: Secondary | ICD-10-CM | POA: Insufficient documentation

## 2019-04-15 MED ORDER — CEPHALEXIN 500 MG PO CAPS: 500.0000 mg | ORAL_CAPSULE | Freq: Three times a day (TID) | ORAL | 0 refills | Status: DC

## 2019-04-15 NOTE — Progress Notes (Signed)
This visit was conducted in person.  BP 120/70 (BP Location: Left Arm, Patient Position: Sitting, Cuff Size: Large)   Pulse 88   Temp 97.8 F (36.6 C) (Temporal)   Ht 5\' 10"  (1.778 m)   Wt 229 lb 7 oz (104.1 kg)   SpO2 97%   BMI 32.92 kg/m    CC: R nipple lump Subjective:    Patient ID: Erik Smith, male    DOB: 1966-03-08, 53 y.o.   MRN: [id number]  HPI: Erik Smith is a 53 y.o. male presenting on 04/15/2019 for Mass (C/o lump on right nipple.  Area is tender but has decreased in size.  Noticed 04/10/19.  Had lump on bilateral nipples previously. )   5d ago noted lump on right nipple - redness, tenderness.  Has treated this time with ibuprofen.  No fever/chills, drainage.  Actually improving each day, today less swollen, less tender.   Initially had bilateral nipple tenderness and swelling 1 month ago - this started after trauma to nipples - had pergola he was installing in his back yard land on his chest right on bilateral nipples and he had to hold it while family got supports up. This improved on its own.      Relevant past medical, surgical, family and social history reviewed and updated as indicated. Interim medical history since our last visit reviewed. Allergies and medications reviewed and updated. Outpatient Medications Prior to Visit  Medication Sig Dispense Refill  . ARIPiprazole (ABILIFY) 10 MG tablet Take 1 tablet (10 mg total) by mouth daily. 30 tablet 6  . ibuprofen (ADVIL,MOTRIN) 200 MG tablet Take 800 mg by mouth every 6 (six) hours as needed.    . lithium carbonate (LITHOBID) 300 MG CR tablet Take one (300mg ) in the morning and two (600mg ) at night 270 tablet 1  . valACYclovir (VALTREX) 500 MG tablet TAKE 1 TABLET BY MOUTH TWICE DAILY. TAKE 3 DAYS AT A TIME (Patient taking differently: TAKE 1 TABLET BY MOUTH TWICE DAILY. TAKE 3 DAYS AT A TIME AS NEEDED) 30 tablet 1   No facility-administered medications prior to visit.      Per HPI unless specifically  indicated in ROS section below Review of Systems Objective:    BP 120/70 (BP Location: Left Arm, Patient Position: Sitting, Cuff Size: Large)   Pulse 88   Temp 97.8 F (36.6 C) (Temporal)   Ht 5\' 10"  (1.778 m)   Wt 229 lb 7 oz (104.1 kg)   SpO2 97%   BMI 32.92 kg/m   Wt Readings from Last 3 Encounters:  04/15/19 229 lb 7 oz (104.1 kg)  09/24/18 210 lb (95.3 kg)  05/02/18 230 lb 4 oz (104.4 kg)    Physical Exam Vitals signs and nursing note reviewed.  Constitutional:      Appearance: Normal appearance. He is obese. He is not ill-appearing.  Chest:     Chest wall: Tenderness present.     Breasts:        Right: Mass, skin change and tenderness present. No inverted nipple or nipple discharge.        Left: No inverted nipple, mass, nipple discharge, skin change or tenderness.       Comments: 1.5cm indurated tender area right above right nipple with mild erythema without fluctuance or warmth. Palpable nontender lateral pectoral LN on right Lymphadenopathy:     Upper Body:     Right upper body: Pectoral adenopathy present. No supraclavicular or axillary adenopathy.     Left  upper body: No supraclavicular, axillary or pectoral adenopathy.  Neurological:     Mental Status: He is alert.       Assessment & Plan:  This visit occurred during the SARS-CoV-2 public health emergency.  Safety protocols were in place, including screening questions prior to the visit, additional usage of staff PPE, and extensive cleaning of exam room while observing appropriate contact time as indicated for disinfecting solutions.   Problem List Items Addressed This Visit    Cellulitis of breast of male    Recurrent redness/swelling/tenderness at R breast above nipple after initial trauma to chest wall at nipples last month - initial episode resolved on its own. ?residual inflammation leading to obstruction making him more susceptible to cellulitis. Will treat as R breast cellulitis right above R nipple with  warm compresses, ibuprofen (limited use with lithium), and keflex 500mg  TID x 7 days. Not consistent with gynecomastia although he is on abilify which could contribute to this. Discussed treatment, as well as importance of f/u if not improving as expected as low threshold to image R breast with mammo/US for further evaluation. Pt agrees with plan.           Meds ordered this encounter  Medications  . cephALEXin (KEFLEX) 500 MG capsule    Sig: Take 1 capsule (500 mg total) by mouth 3 (three) times daily.    Dispense:  21 capsule    Refill:  0   No orders of the defined types were placed in this encounter.  Patient Instructions  I think there may be skin infection above R nipple. Treat with antibiotic sent to pharmacy, continue ibuprofen use, and warm compresses.  If not fully resolved, let me know for imaging study.    Follow up plan: No follow-ups on file.  Ria Bush, MD

## 2019-04-15 NOTE — Assessment & Plan Note (Signed)
Recurrent redness/swelling/tenderness at R breast above nipple after initial trauma to chest wall at nipples last month - initial episode resolved on its own. ?residual inflammation leading to obstruction making him more susceptible to cellulitis. Will treat as R breast cellulitis right above R nipple with warm compresses, ibuprofen (limited use with lithium), and keflex 500mg  TID x 7 days. Not consistent with gynecomastia although he is on abilify which could contribute to this. Discussed treatment, as well as importance of f/u if not improving as expected as low threshold to image R breast with mammo/US for further evaluation. Pt agrees with plan.

## 2019-04-15 NOTE — Patient Instructions (Signed)
I think there may be skin infection above R nipple. Treat with antibiotic sent to pharmacy, continue ibuprofen use, and warm compresses.  If not fully resolved, let me know for imaging study.

## 2019-04-15 NOTE — Telephone Encounter (Addendum)
Left message on vm per dpr asking pt to call back to schedule OV.

## 2019-04-15 NOTE — Telephone Encounter (Signed)
Per Loma Sousa, pt scheduled OV today at 2:45.

## 2019-04-27 ENCOUNTER — Encounter: Payer: Self-pay | Admitting: Family Medicine

## 2019-04-27 DIAGNOSIS — N61 Mastitis without abscess: Secondary | ICD-10-CM

## 2019-04-27 DIAGNOSIS — N6341 Unspecified lump in right breast, subareolar: Secondary | ICD-10-CM

## 2019-04-28 NOTE — Telephone Encounter (Signed)
Mammo/US ordered.

## 2019-05-12 ENCOUNTER — Ambulatory Visit
Admission: RE | Admit: 2019-05-12 | Discharge: 2019-05-12 | Disposition: A | Discharge status: Home / Self Care | Payer: BC Managed Care – PPO | Source: Ambulatory Visit | Attending: Family Medicine | Admitting: Family Medicine

## 2019-05-12 ENCOUNTER — Other Ambulatory Visit: Payer: Self-pay

## 2019-05-12 DIAGNOSIS — N61 Mastitis without abscess: Secondary | ICD-10-CM

## 2019-05-12 DIAGNOSIS — N6341 Unspecified lump in right breast, subareolar: Secondary | ICD-10-CM

## 2019-10-14 ENCOUNTER — Other Ambulatory Visit: Payer: Self-pay | Admitting: Family Medicine

## 2019-10-14 DIAGNOSIS — F3132 Bipolar disorder, current episode depressed, moderate: Secondary | ICD-10-CM

## 2019-10-14 DIAGNOSIS — Z125 Encounter for screening for malignant neoplasm of prostate: Secondary | ICD-10-CM

## 2019-10-17 ENCOUNTER — Other Ambulatory Visit: Payer: Self-pay

## 2019-10-17 ENCOUNTER — Other Ambulatory Visit (INDEPENDENT_AMBULATORY_CARE_PROVIDER_SITE_OTHER): Payer: BC Managed Care – PPO

## 2019-10-17 DIAGNOSIS — Z125 Encounter for screening for malignant neoplasm of prostate: Secondary | ICD-10-CM

## 2019-10-17 DIAGNOSIS — F3132 Bipolar disorder, current episode depressed, moderate: Secondary | ICD-10-CM

## 2019-10-17 LAB — COMPREHENSIVE METABOLIC PANEL
ALT: 18 U/L (ref 0–53)
AST: 16 U/L (ref 0–37)
Albumin: 4.6 g/dL (ref 3.5–5.2)
Alkaline Phosphatase: 58 U/L (ref 39–117)
BUN: 13 mg/dL (ref 6–23)
CO2: 29 mEq/L (ref 19–32)
Calcium: 9.6 mg/dL (ref 8.4–10.5)
Chloride: 102 mEq/L (ref 96–112)
Creatinine, Ser: 0.96 mg/dL (ref 0.40–1.50)
GFR: 81.21 mL/min (ref >60.00)
Glucose, Bld: 92 mg/dL (ref 70–99)
Potassium: 4.6 mEq/L (ref 3.5–5.1)
Sodium: 136 mEq/L (ref 135–145)
Total Bilirubin: 0.9 mg/dL (ref 0.2–1.2)
Total Protein: 6.7 g/dL (ref 6.0–8.3)

## 2019-10-17 LAB — CBC WITH DIFFERENTIAL/PLATELET
Basophils Absolute: 0.1 10*3/uL (ref 0.0–0.1)
Basophils Relative: 1 % (ref 0.0–3.0)
Eosinophils Absolute: 0.2 10*3/uL (ref 0.0–0.7)
Eosinophils Relative: 2.7 % (ref 0.0–5.0)
HCT: 45.7 % (ref 39.0–52.0)
Hemoglobin: 15.8 g/dL (ref 13.0–17.0)
Lymphocytes Relative: 30 % (ref 12.0–46.0)
Lymphs Abs: 1.7 10*3/uL (ref 0.7–4.0)
MCHC: 34.6 g/dL (ref 30.0–36.0)
MCV: 88 fl (ref 78.0–100.0)
Monocytes Absolute: 0.6 10*3/uL (ref 0.1–1.0)
Monocytes Relative: 10.7 % (ref 3.0–12.0)
Neutro Abs: 3.1 10*3/uL (ref 1.4–7.7)
Neutrophils Relative %: 55.6 % (ref 43.0–77.0)
Platelets: 221 10*3/uL (ref 150.0–400.0)
RBC: 5.19 Mil/uL (ref 4.22–5.81)
RDW: 13.5 % (ref 11.5–15.5)
WBC: 5.6 10*3/uL (ref 4.0–10.5)

## 2019-10-17 LAB — PSA: PSA: 0.15 ng/mL (ref 0.10–4.00)

## 2019-10-17 LAB — TSH: TSH: 1.63 u[IU]/mL (ref 0.35–4.50)

## 2019-10-18 LAB — LITHIUM LEVEL: Lithium Lvl: <0.3 mmol/L — ABNORMAL LOW (ref 0.6–1.2)

## 2019-10-21 ENCOUNTER — Encounter: Payer: Self-pay | Admitting: Family Medicine

## 2019-10-21 ENCOUNTER — Other Ambulatory Visit: Payer: Self-pay

## 2019-10-21 ENCOUNTER — Ambulatory Visit (INDEPENDENT_AMBULATORY_CARE_PROVIDER_SITE_OTHER): Payer: BC Managed Care – PPO | Admitting: Family Medicine

## 2019-10-21 VITALS — BP 120/76 | HR 76 | Temp 98.8°F | Ht 70.0 in | Wt 229.1 lb

## 2019-10-21 DIAGNOSIS — Z1211 Encounter for screening for malignant neoplasm of colon: Secondary | ICD-10-CM

## 2019-10-21 DIAGNOSIS — Z8249 Family history of ischemic heart disease and other diseases of the circulatory system: Secondary | ICD-10-CM | POA: Diagnosis not present

## 2019-10-21 DIAGNOSIS — F3176 Bipolar disorder, in full remission, most recent episode depressed: Secondary | ICD-10-CM | POA: Diagnosis not present

## 2019-10-21 DIAGNOSIS — Z0000 Encounter for general adult medical examination without abnormal findings: Secondary | ICD-10-CM

## 2019-10-21 LAB — POC URINALSYSI DIPSTICK (AUTOMATED)
Bilirubin, UA: NEGATIVE
Blood, UA: NEGATIVE
Glucose, UA: NEGATIVE
Ketones, UA: NEGATIVE
Leukocytes, UA: NEGATIVE
Nitrite, UA: NEGATIVE
Protein, UA: NEGATIVE
Spec Grav, UA: 1.025 (ref 1.010–1.025)
Urobilinogen, UA: 0.2 E.U./dL
pH, UA: 6 (ref 5.0–8.0)

## 2019-10-21 MED ORDER — IBUPROFEN 200 MG PO TABS: 600.0000 mg | ORAL_TABLET | Freq: Two times a day (BID) | ORAL | Status: AC | PRN

## 2019-10-21 MED ORDER — VALACYCLOVIR HCL 500 MG PO TABS: ORAL_TABLET | ORAL | 1 refills | Status: DC

## 2019-10-21 NOTE — Progress Notes (Signed)
This visit was conducted in person.  BP 120/76 (BP Location: Left Arm, Patient Position: Sitting, Cuff Size: Large)   Pulse 76   Temp 98.8 F (37.1 C) (Temporal)   Ht 5\' 10"  (1.778 m)   Wt 229 lb 1 oz (103.9 kg)   SpO2 96%   BMI 32.87 kg/m    CC: CPE Subjective:    Patient ID: Erik Smith, male    DOB: October 20, 1965, 54 y.o.   MRN: [id number]  HPI: Erik Smith is a 54 y.o. 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   Bipolar - currently on lithium 600mg  at night, abilify 2mg  daily. Saw psychiatrist Dr Rise Paganini, doing well.  Father recently dx with bladder cancer.   fmhx early CAD (father MI 49s).  Preventative: Colon cancer screening - 1999 for blood in stool, normal. iFOB normal 05/2018. Requests colonoscopy referral Prostate cancer screening - strong stream, no nocturia. + fmhx prostate cancer - paternal grandfather at older age. check yearly.  Flu yearly Tdap 2016 COVID vaccine - completed Pfizer vaccine 07/2019 Seat belt use discussed Sunscreen use discussed. No changing moles on skin. Sees derm.  Smoking - non smoker Alcohol - 1-2 drinks on weekends.  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  Diet: Not a lot of fast food, good water, fruits/vegetables daily     Relevant past medical, surgical, family and social history reviewed and updated as indicated. Interim medical history since our last visit reviewed. Allergies and medications reviewed and updated. Outpatient Medications Prior to Visit  Medication Sig Dispense Refill  . ARIPiprazole (ABILIFY) 2 MG tablet Take 2 mg by mouth at bedtime.    Marland Kitchen doxycycline (VIBRA-TABS) 100 MG tablet Take 100 mg by mouth 2 (two) times daily.    Marland Kitchen lithium carbonate (LITHOBID) 300 MG CR tablet Take 2 tablets (600 mg total) by mouth at bedtime.    Marland Kitchen  ibuprofen (ADVIL,MOTRIN) 200 MG tablet Take 800 mg by mouth every 6 (six) hours as needed.    . lithium carbonate (LITHOBID) 300 MG CR tablet Take one (300mg ) in the morning and two (600mg ) at night 270 tablet 1  . valACYclovir (VALTREX) 500 MG tablet TAKE 1 TABLET BY MOUTH TWICE DAILY. TAKE 3 DAYS AT A TIME (Patient taking differently: TAKE 1 TABLET BY MOUTH TWICE DAILY. TAKE 3 DAYS AT A TIME AS NEEDED) 30 tablet 1  . ARIPiprazole (ABILIFY) 10 MG tablet Take 1 tablet (10 mg total) by mouth daily. 30 tablet 6  . cephALEXin (KEFLEX) 500 MG capsule Take 1 capsule (500 mg total) by mouth 3 (three) times daily. 21 capsule 0   No facility-administered medications prior to visit.     Per HPI unless specifically indicated in ROS section below Review of Systems  Constitutional: Negative for activity change, appetite change, chills, fatigue, fever and unexpected weight change.  HENT: Negative for hearing loss.   Eyes: Negative for visual disturbance.  Respiratory: Negative for cough, chest tightness, shortness of breath and wheezing.   Cardiovascular: Negative for chest pain, palpitations and leg swelling.  Gastrointestinal: Negative for abdominal distention, abdominal pain, blood in stool, constipation, diarrhea, nausea and vomiting.  Genitourinary: Negative for difficulty urinating and hematuria.  Musculoskeletal: Negative for arthralgias, myalgias and neck pain.  Skin: Negative for rash.  Neurological: Negative for dizziness, seizures,  syncope and headaches.  Hematological: Negative for adenopathy. Does not bruise/bleed easily.  Psychiatric/Behavioral: Negative for dysphoric mood. The patient is not nervous/anxious.    Objective:  BP 120/76 (BP Location: Left Arm, Patient Position: Sitting, Cuff Size: Large)   Pulse 76   Temp 98.8 F (37.1 C) (Temporal)   Ht 5\' 10"  (1.778 m)   Wt 229 lb 1 oz (103.9 kg)   SpO2 96%   BMI 32.87 kg/m   Wt Readings from Last 3 Encounters:  10/21/19 229 lb 1  oz (103.9 kg)  04/15/19 229 lb 7 oz (104.1 kg)  09/24/18 210 lb (95.3 kg)      Physical Exam Vitals and nursing note reviewed.  Constitutional:      General: He is not in acute distress.    Appearance: Normal appearance. He is well-developed. He is not ill-appearing.  HENT:     Head: Normocephalic and atraumatic.     Right Ear: Hearing, tympanic membrane, ear canal and external ear normal.     Left Ear: Hearing, tympanic membrane, ear canal and external ear normal.     Mouth/Throat:     Pharynx: Uvula midline.  Eyes:     General: No scleral icterus.    Extraocular Movements: Extraocular movements intact.     Conjunctiva/sclera: Conjunctivae normal.     Pupils: Pupils are equal, round, and reactive to light.  Cardiovascular:     Rate and Rhythm: Normal rate and regular rhythm.     Pulses: Normal pulses.          Radial pulses are 2+ on the right side and 2+ on the left side.     Heart sounds: Normal heart sounds. No murmur heard.   Pulmonary:     Effort: Pulmonary effort is normal. No respiratory distress.     Breath sounds: Normal breath sounds. No wheezing, rhonchi or rales.  Abdominal:     General: Abdomen is flat. Bowel sounds are normal. There is no distension.     Palpations: Abdomen is soft. There is no mass.     Tenderness: There is no abdominal tenderness. There is no guarding or rebound.     Hernia: No hernia is present.  Musculoskeletal:        General: Normal range of motion.     Cervical back: Normal range of motion and neck supple.     Right lower leg: No edema.     Left lower leg: No edema.  Lymphadenopathy:     Cervical: No cervical adenopathy.  Skin:    General: Skin is warm and dry.     Findings: No rash.  Neurological:     General: No focal deficit present.     Mental Status: He is alert and oriented to person, place, and time.     Comments: CN grossly intact, station and gait intact  Psychiatric:        Mood and Affect: Mood normal.         Behavior: Behavior normal.        Thought Content: Thought content normal.        Judgment: Judgment normal.       Results for orders placed or performed in visit on 10/21/19  POCT Urinalysis Dipstick (Automated)  Result Value Ref Range   Color, UA yellow    Clarity, UA clear    Glucose, UA Negative Negative   Bilirubin, UA negative    Ketones, UA negative    Spec Grav, UA 1.025 1.010 -  1.025   Blood, UA negative    pH, UA 6.0 5.0 - 8.0   Protein, UA Negative Negative   Urobilinogen, UA 0.2 0.2 or 1.0 E.U./dL   Nitrite, UA negative    Leukocytes, UA Negative Negative   Assessment & Plan:  This visit occurred during the SARS-CoV-2 public health emergency.  Safety protocols were in place, including screening questions prior to the visit, additional usage of staff PPE, and extensive cleaning of exam room while observing appropriate contact time as indicated for disinfecting solutions.   Problem List Items Addressed This Visit    Healthcare maintenance - Primary    Preventative protocols reviewed and updated unless pt declined. Discussed healthy diet and lifestyle.  Check UA given new fmhx bladder cancer (father - smoker)      Relevant Orders   POCT Urinalysis Dipstick (Automated) (Completed)   Family history of premature CAD    Update FLP next year.       Bipolar disorder, unspecified ([place])    Chronic, stable followed by psych.  Continue current regimen - taking abilify 2mg  daily and lithium 600mg  nightly.        Other Visit Diagnoses    Special screening for malignant neoplasms, colon       Relevant Orders   Ambulatory referral to Gastroenterology       Meds ordered this encounter  Medications  . ibuprofen (ADVIL) 200 MG tablet    Sig: Take 3 tablets (600 mg total) by mouth 2 (two) times daily as needed for moderate pain.  . valACYclovir (VALTREX) 500 MG tablet    Sig: TAKE 1 TABLET BY MOUTH TWICE DAILY. TAKE 3 DAYS AT A TIME AS NEEDED    Dispense:  30 tablet     Refill:  1   Orders Placed This Encounter  Procedures  . Ambulatory referral to Gastroenterology    Referral Priority:   Routine    Referral Type:   Consultation    Referral Reason:   Specialty Services Required    Number of Visits Requested:   1  . POCT Urinalysis Dipstick (Automated)    Patient instructions; We will refer you for colonoscopy.  Schedule dental exam.  Urinalysis today.  You are doing well work on regular exercise and healthy diet for goal weight loss. Return as needed or in 1 year for next physical.   Follow up plan: Return in about 1 year (around 10/20/2020) for annual exam, prior fasting for blood work.  Ria Bush, MD

## 2019-10-21 NOTE — Patient Instructions (Addendum)
We will refer you for colonoscopy.  Schedule dental exam.  Urinalysis today.  You are doing well work on regular exercise and healthy diet for goal weight loss. Return as needed or in 1 year for next physical.   Health Maintenance, Male Adopting a healthy lifestyle and getting preventive care are important in promoting health and wellness. Ask your health care provider about:  The right schedule for you to have regular tests and exams.  Things you can do on your own to prevent diseases and keep yourself healthy. What should I know about diet, weight, and exercise? Eat a healthy diet   Eat a diet that includes plenty of vegetables, fruits, low-fat dairy products, and lean protein.  Do not eat a lot of foods that are high in solid fats, added sugars, or sodium. Maintain a healthy weight Body mass index (BMI) is a measurement that can be used to identify possible weight problems. It estimates body fat based on height and weight. Your health care provider can help determine your BMI and help you achieve or maintain a healthy weight. Get regular exercise Get regular exercise. This is one of the most important things you can do for your health. Most adults should:  Exercise for at least 150 minutes each week. The exercise should increase your heart rate and make you sweat (moderate-intensity exercise).  Do strengthening exercises at least twice a week. This is in addition to the moderate-intensity exercise.  Spend less time sitting. Even light physical activity can be beneficial. Watch cholesterol and blood lipids Have your blood tested for lipids and cholesterol at 55 years of age, then have this test every 5 years. You may need to have your cholesterol levels checked more often if:  Your lipid or cholesterol levels are high.  You are older than 55 years of age.  You are at high risk for heart disease. What should I know about cancer screening? Many types of cancers can be detected  early and may often be prevented. Depending on your health history and family history, you may need to have cancer screening at various ages. This may include screening for:  Colorectal cancer.  Prostate cancer.  Skin cancer.  Lung cancer. What should I know about heart disease, diabetes, and high blood pressure? Blood pressure and heart disease  High blood pressure causes heart disease and increases the risk of stroke. This is more likely to develop in people who have high blood pressure readings, are of African descent, or are overweight.  Talk with your health care provider about your target blood pressure readings.  Have your blood pressure checked: ? Every 3-5 years if you are 53-51 years of age. ? Every year if you are 43 years old or older.  If you are between the ages of 39 and 25 and are a current or former smoker, ask your health care provider if you should have a one-time screening for abdominal aortic aneurysm (AAA). Diabetes Have regular diabetes screenings. This checks your fasting blood sugar level. Have the screening done:  Once every three years after age 41 if you are at a normal weight and have a low risk for diabetes.  More often and at a younger age if you are overweight or have a high risk for diabetes. What should I know about preventing infection? Hepatitis B If you have a higher risk for hepatitis B, you should be screened for this virus. Talk with your health care provider to find out if you  are at risk for hepatitis B infection. Hepatitis C Blood testing is recommended for:  Everyone born from [birth year] through 1966.  Anyone with known risk factors for hepatitis C. Sexually transmitted infections (STIs)  You should be screened each year for STIs, including gonorrhea and chlamydia, if: ? You are sexually active and are younger than 55 years of age. ? You are older than 55 years of age and your health care provider tells you that you are at risk for this  type of infection. ? Your sexual activity has changed since you were last screened, and you are at increased risk for chlamydia or gonorrhea. Ask your health care provider if you are at risk.  Ask your health care provider about whether you are at high risk for HIV. Your health care provider may recommend a prescription medicine to help prevent HIV infection. If you choose to take medicine to prevent HIV, you should first get tested for HIV. You should then be tested every 3 months for as long as you are taking the medicine. Follow these instructions at home: Lifestyle  Do not use any products that contain nicotine or tobacco, such as cigarettes, e-cigarettes, and chewing tobacco. If you need help quitting, ask your health care provider.  Do not use street drugs.  Do not share needles.  Ask your health care provider for help if you need support or information about quitting drugs. Alcohol use  Do not drink alcohol if your health care provider tells you not to drink.  If you drink alcohol: ? Limit how much you have to 0-2 drinks a day. ? Be aware of how much alcohol is in your drink. In the [place] one drink equals one 12 oz bottle of beer (355 mL), one 5 oz glass of wine (148 mL), or one 1 oz glass of hard liquor (44 mL). General instructions  Schedule regular health, dental, and eye exams.  Stay current with your vaccines.  Tell your health care provider if: ? You often feel depressed. ? You have ever been abused or do not feel safe at home. Summary  Adopting a healthy lifestyle and getting preventive care are important in promoting health and wellness.  Follow your health care provider's instructions about healthy diet, exercising, and getting tested or screened for diseases.  Follow your health care provider's instructions on monitoring your cholesterol and blood pressure. This information is not intended to replace advice given to you by your health care provider. Make sure  you discuss any questions you have with your health care provider. Document Revised: 04/17/2018 Document Reviewed: 04/17/2018 Elsevier Patient Education  2020 [employer].

## 2019-10-21 NOTE — Assessment & Plan Note (Addendum)
Preventative protocols reviewed and updated unless pt declined. Discussed healthy diet and lifestyle.  Check UA given new fmhx bladder cancer (father - smoker)

## 2019-10-21 NOTE — Assessment & Plan Note (Signed)
Update FLP next year.

## 2019-10-21 NOTE — Assessment & Plan Note (Addendum)
Chronic, stable followed by psych.  Continue current regimen - taking abilify 2mg  daily and lithium 600mg  nightly.

## 2019-10-22 ENCOUNTER — Encounter: Payer: Self-pay | Admitting: Gastroenterology

## 2019-10-23 ENCOUNTER — Ambulatory Visit (INDEPENDENT_AMBULATORY_CARE_PROVIDER_SITE_OTHER): Payer: BC Managed Care – PPO | Admitting: Psychology

## 2019-10-23 DIAGNOSIS — F3170 Bipolar disorder, currently in remission, most recent episode unspecified: Secondary | ICD-10-CM

## 2019-11-27 ENCOUNTER — Other Ambulatory Visit: Payer: Self-pay

## 2019-11-27 ENCOUNTER — Encounter: Payer: Self-pay | Admitting: Gastroenterology

## 2019-11-27 ENCOUNTER — Ambulatory Visit (AMBULATORY_SURGERY_CENTER): Payer: Self-pay

## 2019-11-27 VITALS — Ht 70.0 in | Wt 225.0 lb

## 2019-11-27 DIAGNOSIS — Z1211 Encounter for screening for malignant neoplasm of colon: Secondary | ICD-10-CM

## 2019-11-27 NOTE — Progress Notes (Signed)
No egg or soy allergy known to patient  No issues with past sedation with any surgeries or procedures no intubation problems in the past  No diet pills per patient No home 02 use per patient  No blood thinners per patient  Pt denies issues with constipation  No A fib or A flutter  EMMI video to pt or MyChart  COVID 19 guidelines implemented in [facility] today   Pt has been vaccinated for covid  Due to the COVID-19 pandemic we are asking patients to follow these guidelines. Please only bring one care partner. Please be aware that your care partner may wait in the car in the parking lot or if they feel like they will be too hot to wait in the car, they may wait in the lobby on the 4th floor. All care partners are required to wear a mask the entire time (we do not have any that we can provide them), they need to practice social distancing, and we will do a Covid check for all patient's and care partners when you arrive. Also we will check their temperature and your temperature. If the care partner waits in their car they need to stay in the parking lot the entire time and we will call them on their cell phone when the patient is ready for discharge so they can bring the car to the front of the building. Also all patient's will need to wear a mask into building.

## 2019-12-08 ENCOUNTER — Encounter: Payer: BC Managed Care – PPO | Admitting: Gastroenterology

## 2020-04-25 IMAGING — MG DIGITAL DIAGNOSTIC BILAT W/ TOMO W/ CAD
6 of 10 series · 6 of 30 positions shown · non-contrast
Comparison: None

CLINICAL DATA: The patient had redness and swelling superior to the
right nipple several weeks ago. The symptoms resolved on their own
but then reoccurred. The patient was treated with antibiotics in the
redness and swelling have resolved. There is a tiny lump in the
region of the recent symptoms.

EXAM:
DIGITAL DIAGNOSTIC BILATERAL MAMMOGRAM WITH CAD AND TOMO
ULTRASOUND RIGHT BREAST

[L CC synth-2D]
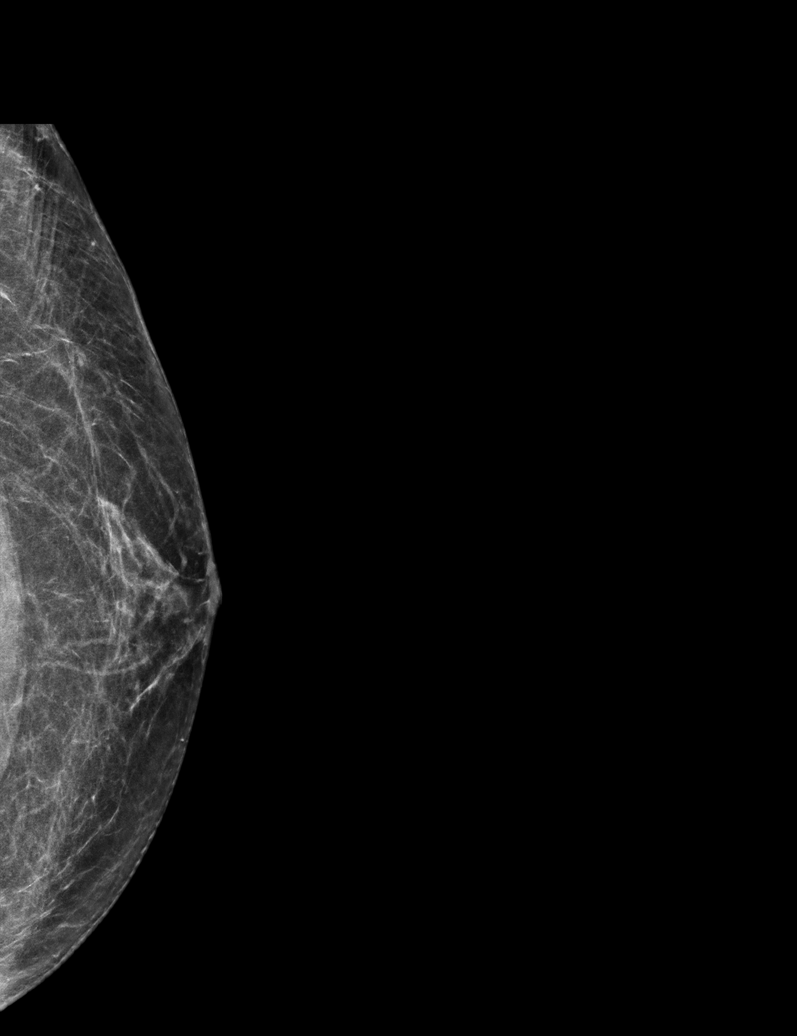

[R TAN synth-2D]
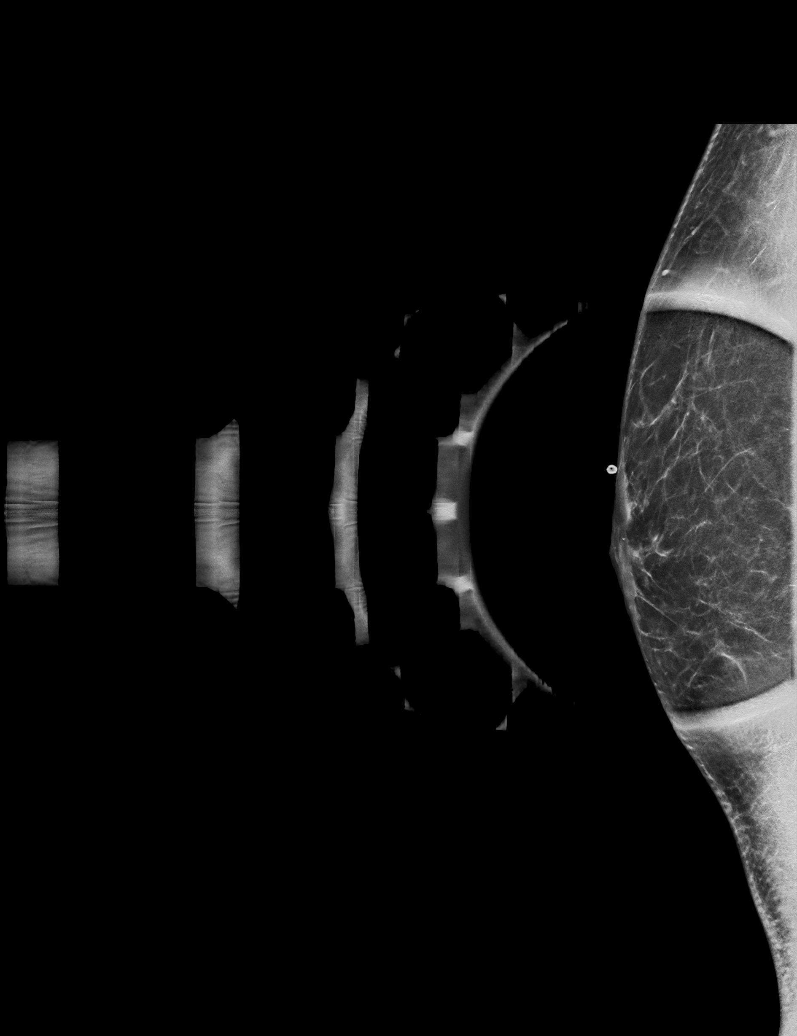

[R CC synth-2D]
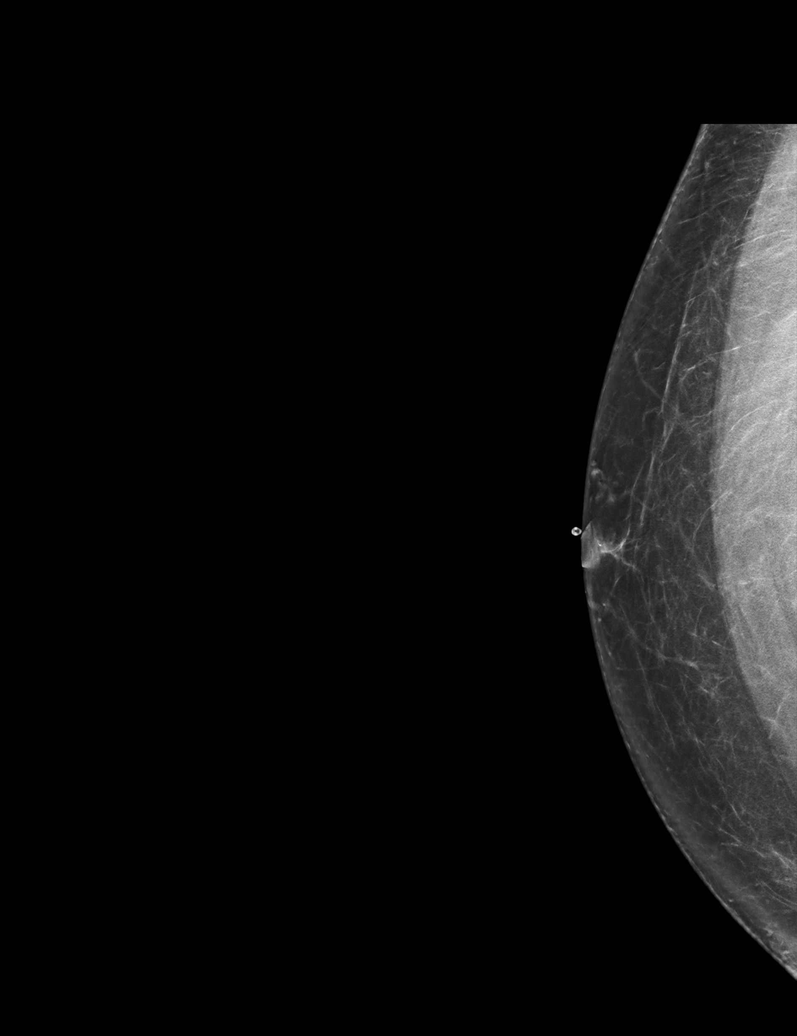

[L MLO synth-2D]
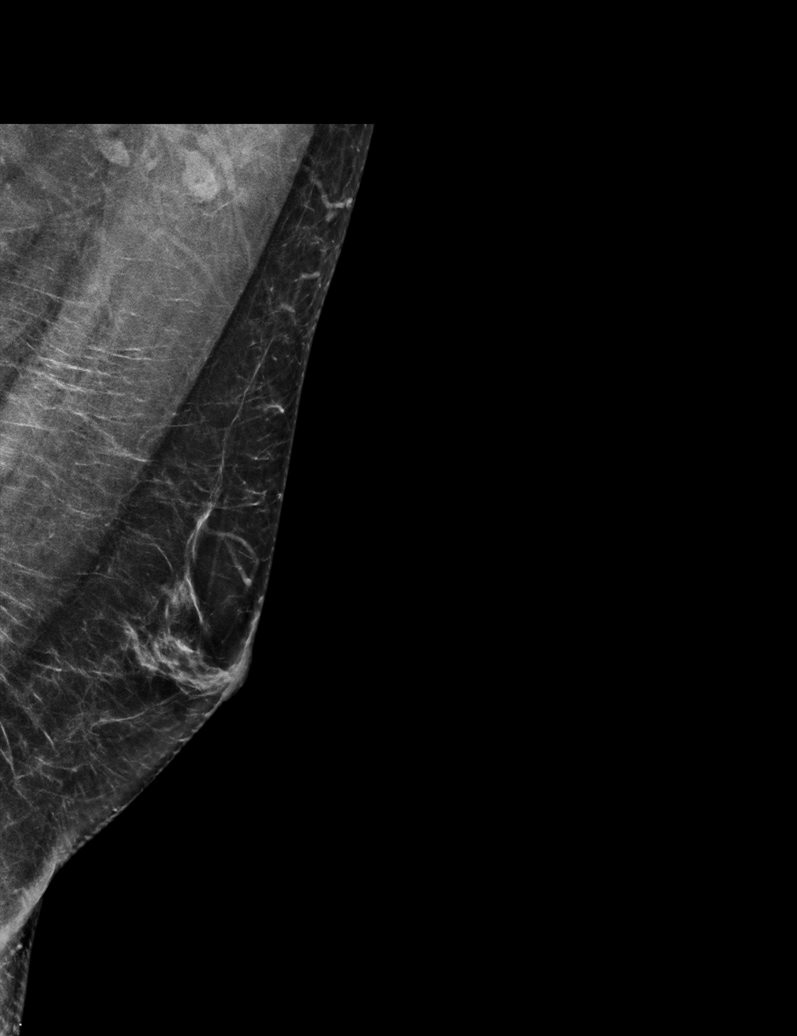

[R MLO synth-2D]
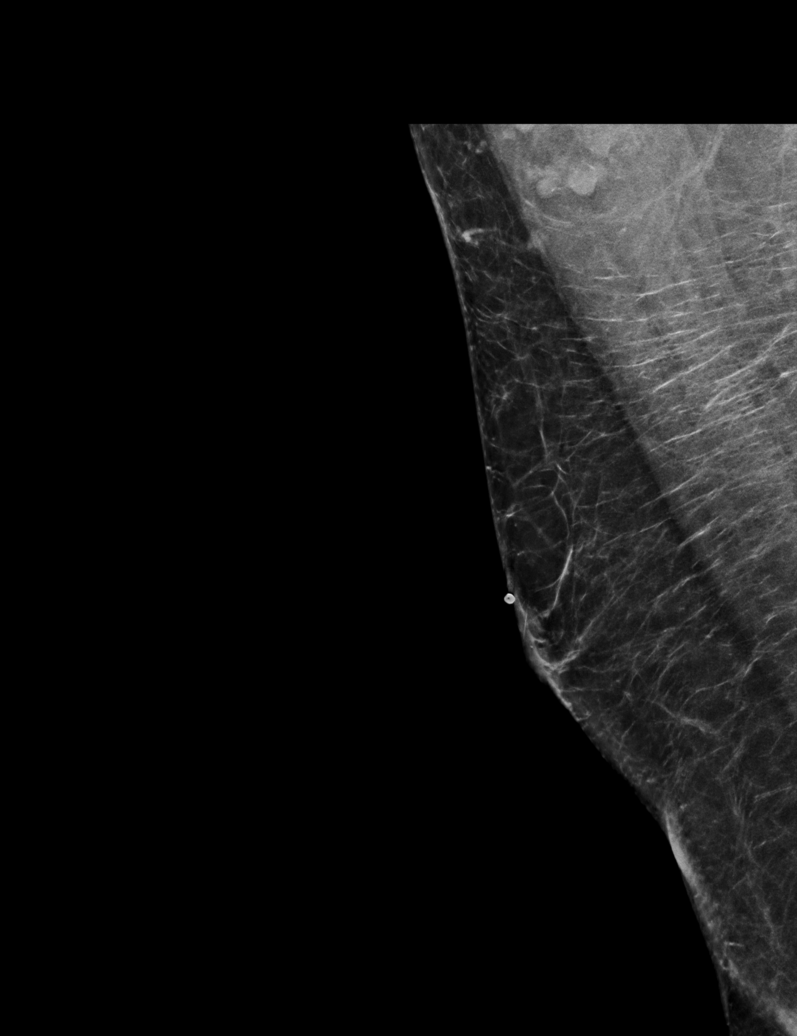

[R TAN tomo · tomo slice 27/52.0]
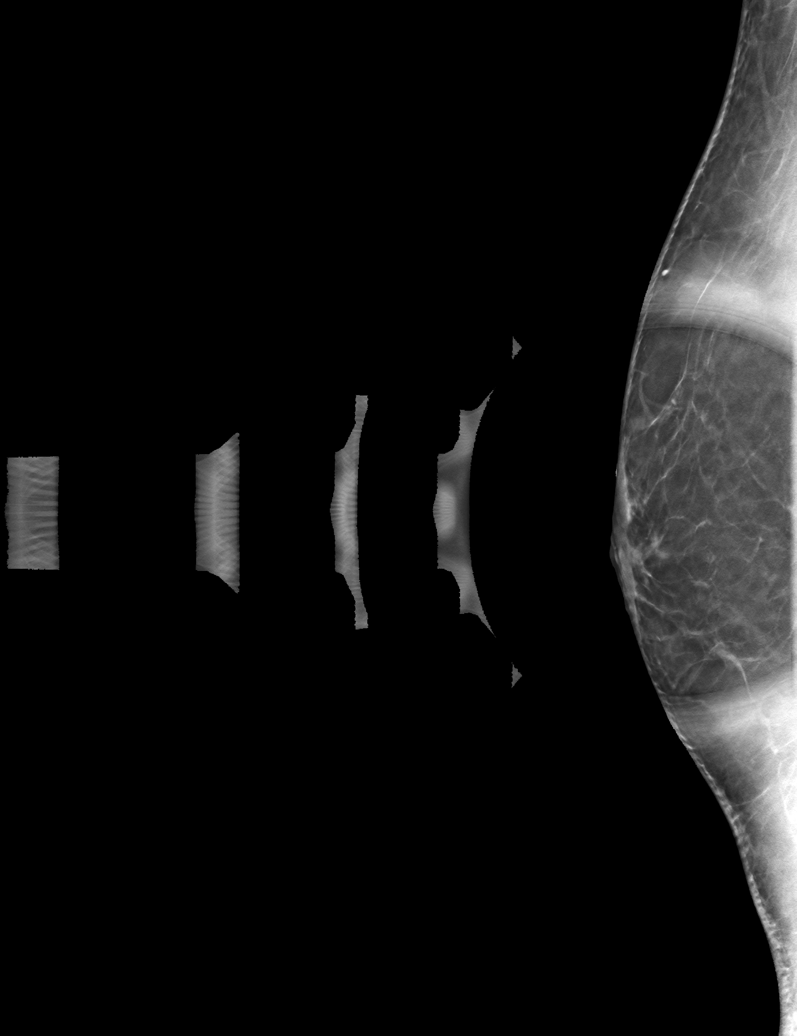

[6 of 30 positions shown; findings below may reference images not displayed]

ACR Breast Density Category b: There are scattered areas of
fibroglandular density.
FINDINGS: No suspicious masses, calcifications, or distortion are identified
in either breast mammographically.

Mammographic images were processed with CAD.

On physical exam, a subtle lump is felt just superior to the right
nipple, superficial in location.

Targeted ultrasound is performed, showing slight decreased
echogenicity at the interface between the skin and underlying
subcutaneous fat. No discrete mass identified.
IMPRESSION: The sonographic findings are likely due to recent infection or
inflammation. There is no abscess. There is no sonographic or
mammographic evidence of malignancy.

RECOMMENDATION:
Recommend following the patient's symptoms to complete clinical
resolution. If they reoccur or worsen, recommend returning to the
[REDACTED] for further imaging.

I have discussed the findings and recommendations with the patient.
If applicable, a reminder letter will be sent to the patient
regarding the next appointment.

BI-RADS CATEGORY  2: Benign.

## 2020-06-02 ENCOUNTER — Ambulatory Visit (INDEPENDENT_AMBULATORY_CARE_PROVIDER_SITE_OTHER): Payer: BC Managed Care – PPO | Admitting: Psychology

## 2020-06-02 DIAGNOSIS — F3170 Bipolar disorder, currently in remission, most recent episode unspecified: Secondary | ICD-10-CM

## 2020-06-14 ENCOUNTER — Ambulatory Visit (INDEPENDENT_AMBULATORY_CARE_PROVIDER_SITE_OTHER): Payer: BC Managed Care – PPO | Admitting: Psychology

## 2020-06-14 DIAGNOSIS — F3170 Bipolar disorder, currently in remission, most recent episode unspecified: Secondary | ICD-10-CM

## 2020-07-19 ENCOUNTER — Ambulatory Visit (INDEPENDENT_AMBULATORY_CARE_PROVIDER_SITE_OTHER): Payer: BC Managed Care – PPO | Admitting: Psychology

## 2020-07-19 DIAGNOSIS — F3170 Bipolar disorder, currently in remission, most recent episode unspecified: Secondary | ICD-10-CM

## 2020-10-09 ENCOUNTER — Other Ambulatory Visit: Payer: Self-pay | Admitting: Family Medicine

## 2020-10-09 DIAGNOSIS — F3176 Bipolar disorder, in full remission, most recent episode depressed: Secondary | ICD-10-CM

## 2020-10-09 DIAGNOSIS — Z125 Encounter for screening for malignant neoplasm of prostate: Secondary | ICD-10-CM

## 2020-10-09 DIAGNOSIS — E669 Obesity, unspecified: Secondary | ICD-10-CM

## 2020-10-15 ENCOUNTER — Other Ambulatory Visit (INDEPENDENT_AMBULATORY_CARE_PROVIDER_SITE_OTHER): Payer: BC Managed Care – PPO

## 2020-10-15 ENCOUNTER — Other Ambulatory Visit: Payer: Self-pay

## 2020-10-15 DIAGNOSIS — F3176 Bipolar disorder, in full remission, most recent episode depressed: Secondary | ICD-10-CM

## 2020-10-15 DIAGNOSIS — E669 Obesity, unspecified: Secondary | ICD-10-CM | POA: Diagnosis not present

## 2020-10-15 DIAGNOSIS — Z125 Encounter for screening for malignant neoplasm of prostate: Secondary | ICD-10-CM | POA: Diagnosis not present

## 2020-10-15 LAB — COMPREHENSIVE METABOLIC PANEL
ALT: 38 U/L (ref 0–53)
AST: 24 U/L (ref 0–37)
Albumin: 4.5 g/dL (ref 3.5–5.2)
Alkaline Phosphatase: 89 U/L (ref 39–117)
BUN: 19 mg/dL (ref 6–23)
CO2: 27 mEq/L (ref 19–32)
Calcium: 9.6 mg/dL (ref 8.4–10.5)
Chloride: 103 mEq/L (ref 96–112)
Creatinine, Ser: 0.91 mg/dL (ref 0.40–1.50)
GFR: 94.32 mL/min (ref >60.00)
Glucose, Bld: 92 mg/dL (ref 70–99)
Potassium: 5.1 mEq/L (ref 3.5–5.1)
Sodium: 139 mEq/L (ref 135–145)
Total Bilirubin: 0.5 mg/dL (ref 0.2–1.2)
Total Protein: 6.9 g/dL (ref 6.0–8.3)

## 2020-10-15 LAB — CBC WITH DIFFERENTIAL/PLATELET
Basophils Absolute: 0 10*3/uL (ref 0.0–0.1)
Basophils Relative: 0.5 % (ref 0.0–3.0)
Eosinophils Absolute: 0.1 10*3/uL (ref 0.0–0.7)
Eosinophils Relative: 2.8 % (ref 0.0–5.0)
HCT: 43.7 % (ref 39.0–52.0)
Hemoglobin: 15.4 g/dL (ref 13.0–17.0)
Lymphocytes Relative: 28.7 % (ref 12.0–46.0)
Lymphs Abs: 1.4 10*3/uL (ref 0.7–4.0)
MCHC: 35.1 g/dL (ref 30.0–36.0)
MCV: 86.2 fl (ref 78.0–100.0)
Monocytes Absolute: 0.7 10*3/uL (ref 0.1–1.0)
Monocytes Relative: 13.6 % — ABNORMAL HIGH (ref 3.0–12.0)
Neutro Abs: 2.6 10*3/uL (ref 1.4–7.7)
Neutrophils Relative %: 54.4 % (ref 43.0–77.0)
Platelets: 214 10*3/uL (ref 150.0–400.0)
RBC: 5.07 Mil/uL (ref 4.22–5.81)
RDW: 13.7 % (ref 11.5–15.5)
WBC: 4.8 10*3/uL (ref 4.0–10.5)

## 2020-10-15 LAB — LIPID PANEL
Cholesterol: 216 mg/dL — ABNORMAL HIGH (ref 0–200)
HDL: 49.5 mg/dL (ref >39.00)
NonHDL: 166.65
Total CHOL/HDL Ratio: 4
Triglycerides: 213 mg/dL — ABNORMAL HIGH (ref 0.0–149.0)
VLDL: 42.6 mg/dL — ABNORMAL HIGH (ref 0.0–40.0)

## 2020-10-15 LAB — LDL CHOLESTEROL, DIRECT: Direct LDL: 108 mg/dL

## 2020-10-15 LAB — PSA: PSA: 0.13 ng/mL (ref 0.10–4.00)

## 2020-10-15 LAB — TSH: TSH: 1.61 u[IU]/mL (ref 0.35–4.50)

## 2020-10-16 LAB — LITHIUM LEVEL: Lithium Lvl: 0.4 mmol/L — ABNORMAL LOW (ref 0.6–1.2)

## 2020-10-20 ENCOUNTER — Other Ambulatory Visit: Payer: Self-pay | Admitting: Family Medicine

## 2020-10-21 NOTE — Telephone Encounter (Signed)
Valtrex Last filled:  10/21/19, #30 Last OV:  10/21/19, CPE Next OV:  10/21/20, CPE

## 2020-10-21 NOTE — Progress Notes (Signed)
Patient ID: Erik Smith, male    DOB: Oct 17, 1965, 55 y.o.   MRN: [id number]  This visit was conducted in person.  BP 116/78   Pulse 82   Temp 98.3 F (36.8 C) (Temporal)   Ht 5' 10.25" (1.784 m)   Wt 225 lb 9 oz (102.3 kg)   SpO2 96%   BMI 32.13 kg/m    CC: CPE Subjective:   HPI: Erik Smith is a 55 y.o. 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    Bipolar - currently on lithium 600mg  bid, abilify 2mg  daily. Saw psychiatrist Dr Rise Paganini, doing well.  Father recently dx with bladder cancer recurrent.  Fmhx early CAD (father MI 55s).  Derm followed by Magnus Sinning PA - rosacea managed with triamcinolone and doxy.   Tested positive for COVID April 2022. Self isolated with benefit.   Preventative: Colon cancer screening - 1999 for blood in stool, normal. Had to cancel colonoscopy scheduled for last year due to family emergency. Requests new colonoscopy referral.  Prostate cancer screening - strong stream, no nocturia. + fmhx prostate cancer - paternal grandfather at older age. Checks yearly.  Lung cancer screening - not eligible  Flu yearly COVID vaccine - completed Pfizer vaccine 06/2019, 07/2019, booster 02/2020 Tdap 2016 Shingrix - discussed  Seat belt use discussed  Sunscreen use discussed. No changing moles on skin. Sees derm.  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  Diet: Not a lot of fast food, good water, fruits/vegetables daily     Relevant past medical, surgical, family and social history reviewed and updated as indicated. Interim medical history since our last visit reviewed. Allergies and medications reviewed and updated. Outpatient  Medications Prior to Visit  Medication Sig Dispense Refill   ARIPiprazole (ABILIFY) 2 MG tablet Take 2 mg by mouth at bedtime.     doxycycline (VIBRA-TABS) 100 MG tablet Take 100 mg by mouth 2 (two) times daily. As needed     ibuprofen (ADVIL) 200 MG tablet Take 3 tablets (600 mg total) by mouth 2 (two) times daily as needed for moderate pain.     ketoconazole (NIZORAL) 2 % cream Apply topically 2 (two) times daily. As needed     metroNIDAZOLE (METROGEL) 0.75 % gel SMARTSIG:1 Sparingly Topical Every Night     SULFACLEANSE 8/4 8-4 % SUSP Apply 1 application topically 2 (two) times daily. As needed     triamcinolone ointment (KENALOG) 0.1 % Apply topically 2 (two) times daily. As needed     lithium carbonate (LITHOBID) 300 MG CR tablet Take 600 mg by mouth at bedtime. Takes 2 tablets twice a day     valACYclovir (VALTREX) 500 MG tablet TAKE 1 TABLET BY MOUTH TWICE DAILY. TAKE 3 DAYS AT A TIME AS NEEDED 30 tablet 1   lithium carbonate (LITHOBID) 300 MG CR tablet Take 2 tablets (600 mg total) by mouth 2 (two) times daily.     No facility-administered medications prior to visit.     Per HPI unless specifically indicated in ROS section below Review of Systems  Constitutional:  Negative for activity change, appetite change, chills, fatigue, fever and unexpected weight change.  HENT:  Negative  for hearing loss.   Eyes:  Negative for visual disturbance.  Respiratory:  Positive for cough (post-covid). Negative for chest tightness, shortness of breath and wheezing.   Cardiovascular:  Negative for chest pain, palpitations and leg swelling.  Gastrointestinal:  Negative for abdominal distention, abdominal pain, blood in stool, constipation, diarrhea, nausea and vomiting.  Genitourinary:  Negative for difficulty urinating and hematuria.  Musculoskeletal:  Negative for arthralgias, myalgias and neck pain.  Skin:  Negative for rash.  Neurological:  Negative for dizziness, seizures, syncope and headaches.   Hematological:  Negative for adenopathy. Does not bruise/bleed easily.  Psychiatric/Behavioral:  Negative for dysphoric mood. The patient is nervous/anxious.   Objective:  BP 116/78   Pulse 82   Temp 98.3 F (36.8 C) (Temporal)   Ht 5' 10.25" (1.784 m)   Wt 225 lb 9 oz (102.3 kg)   SpO2 96%   BMI 32.13 kg/m   Wt Readings from Last 3 Encounters:  10/22/20 225 lb 9 oz (102.3 kg)  11/27/19 225 lb (102.1 kg)  10/21/19 229 lb 1 oz (103.9 kg)      Physical Exam Vitals and nursing note reviewed.  Constitutional:      General: He is not in acute distress.    Appearance: Normal appearance. He is well-developed. He is not ill-appearing.  HENT:     Head: Normocephalic and atraumatic.     Right Ear: Hearing, tympanic membrane, ear canal and external ear normal.     Left Ear: Hearing, tympanic membrane, ear canal and external ear normal.  Eyes:     General: No scleral icterus.    Extraocular Movements: Extraocular movements intact.     Conjunctiva/sclera: Conjunctivae normal.     Pupils: Pupils are equal, round, and reactive to light.  Neck:     Thyroid: No thyroid mass or thyromegaly.  Cardiovascular:     Rate and Rhythm: Normal rate and regular rhythm.     Pulses: Normal pulses.          Radial pulses are 2+ on the right side and 2+ on the left side.     Heart sounds: Normal heart sounds. No murmur heard. Pulmonary:     Effort: Pulmonary effort is normal. No respiratory distress.     Breath sounds: Normal breath sounds. No wheezing, rhonchi or rales.  Abdominal:     General: Bowel sounds are normal. There is no distension.     Palpations: Abdomen is soft. There is no mass.     Tenderness: There is no abdominal tenderness. There is no guarding or rebound.     Hernia: No hernia is present.  Musculoskeletal:        General: Normal range of motion.     Cervical back: Normal range of motion and neck supple.     Right lower leg: No edema.     Left lower leg: No edema.   Lymphadenopathy:     Cervical: No cervical adenopathy.  Skin:    General: Skin is warm and dry.     Findings: No rash.  Neurological:     General: No focal deficit present.     Mental Status: He is alert and oriented to person, place, and time.  Psychiatric:        Mood and Affect: Mood normal.        Behavior: Behavior normal.        Thought Content: Thought content normal.        Judgment: Judgment normal.  Results for orders placed or performed in visit on 10/15/20  Lipid panel  Result Value Ref Range   Cholesterol 216 (H) 0 - 200 mg/dL   Triglycerides 213.0 (H) 0.0 - 149.0 mg/dL   HDL 49.50 >39.00 mg/dL   VLDL 42.6 (H) 0.0 - 40.0 mg/dL   Total CHOL/HDL Ratio 4    NonHDL 166.65   Lithium level  Result Value Ref Range   Lithium Lvl 0.4 (L) 0.6 - 1.2 mmol/L  PSA  Result Value Ref Range   PSA 0.13 0.10 - 4.00 ng/mL  TSH  Result Value Ref Range   TSH 1.61 0.35 - 4.50 uIU/mL  CBC with Differential/Platelet  Result Value Ref Range   WBC 4.8 4.0 - 10.5 K/uL   RBC 5.07 4.22 - 5.81 Mil/uL   Hemoglobin 15.4 13.0 - 17.0 g/dL   HCT 43.7 39.0 - 52.0 %   MCV 86.2 78.0 - 100.0 fl   MCHC 35.1 30.0 - 36.0 g/dL   RDW 13.7 11.5 - 15.5 %   Platelets 214.0 150.0 - 400.0 K/uL   Neutrophils Relative % 54.4 43.0 - 77.0 %   Lymphocytes Relative 28.7 12.0 - 46.0 %   Monocytes Relative 13.6 (H) 3.0 - 12.0 %   Eosinophils Relative 2.8 0.0 - 5.0 %   Basophils Relative 0.5 0.0 - 3.0 %   Neutro Abs 2.6 1.4 - 7.7 K/uL   Lymphs Abs 1.4 0.7 - 4.0 K/uL   Monocytes Absolute 0.7 0.1 - 1.0 K/uL   Eosinophils Absolute 0.1 0.0 - 0.7 K/uL   Basophils Absolute 0.0 0.0 - 0.1 K/uL  Comprehensive metabolic panel  Result Value Ref Range   Sodium 139 135 - 145 mEq/L   Potassium 5.1 3.5 - 5.1 mEq/L   Chloride 103 96 - 112 mEq/L   CO2 27 19 - 32 mEq/L   Glucose, Bld 92 70 - 99 mg/dL   BUN 19 6 - 23 mg/dL   Creatinine, Ser 0.91 0.40 - 1.50 mg/dL   Total Bilirubin 0.5 0.2 - 1.2 mg/dL   Alkaline  Phosphatase 89 39 - 117 U/L   AST 24 0 - 37 U/L   ALT 38 0 - 53 U/L   Total Protein 6.9 6.0 - 8.3 g/dL   Albumin 4.5 3.5 - 5.2 g/dL   GFR 94.32 >60.00 mL/min   Calcium 9.6 8.4 - 10.5 mg/dL  LDL cholesterol, direct  Result Value Ref Range   Direct LDL 108.0 mg/dL   Depression screen [facility] 2/9 10/22/2020 10/21/2019 09/11/2018 05/02/2018  Decreased Interest 0 0 0 0  Down, Depressed, Hopeless 1 0 2 0  PHQ - 2 Score 1 0 2 0  Altered sleeping 0 - 2 -  Tired, decreased energy 0 - 1 -  Change in appetite 0 - 1 -  Feeling bad or failure about yourself  1 - 1 -  Trouble concentrating 0 - 0 -  Moving slowly or fidgety/restless 0 - 0 -  Suicidal thoughts 0 - 0 -  PHQ-9 Score 2 - 7 -    GAD 7 : Generalized Anxiety Score 10/22/2020 09/11/2018  Nervous, Anxious, on Edge 3 1  Control/stop worrying 2 1  Worry too much - different things 2 1  Trouble relaxing 2 2  Restless 0 1  Easily annoyed or irritable 3 3  Afraid - awful might happen 1 1  Total GAD 7 Score 13 10   Assessment & Plan:  This visit occurred during the SARS-CoV-2 public health  emergency.  Safety protocols were in place, including screening questions prior to the visit, additional usage of staff PPE, and extensive cleaning of exam room while observing appropriate contact time as indicated for disinfecting solutions.   Problem List Items Addressed This Visit     Bipolar disorder, unspecified ([place])    Appreciate psych care.  Continues lithium and abilify.        Healthcare maintenance - Primary    Preventative protocols reviewed and updated unless pt declined. Discussed healthy diet and lifestyle.        Recurrent genital HSV (herpes simplex virus) infection    Refill valtrex PRN.        Relevant Medications   valACYclovir (VALTREX) 500 MG tablet   Obesity, Class I, BMI 30-34.9    Encouraged healthy diet and lifestyle choices to affect sustainable weight loss.        Family history of premature CAD    Father was  smoker.       Dyslipidemia    Reviewed elevated cholesterol levels as well as diet choices to improve readings namely trig and LDL. Reviewed ASCVD risk.  The 10-year ASCVD risk score Mikey Bussing DC Brooke Bonito., et al., 2013) is: 5.7%   Values used to calculate the score:     Age: 66 years     Sex: Male     Is Non-Hispanic African American: No     Diabetic: No     Tobacco smoker: No     Systolic Blood Pressure: 017 mmHg     Is BP treated: No     HDL Cholesterol: 49.5 mg/dL     Total Cholesterol: 216 mg/dL        Other Visit Diagnoses     Special screening for malignant neoplasms, colon       Relevant Orders   Ambulatory referral to Gastroenterology   Need for shingles vaccine       Relevant Orders   Varicella-zoster vaccine IM (Completed)        Meds ordered this encounter  Medications   valACYclovir (VALTREX) 500 MG tablet    Sig: TAKE 1 TABLET BY MOUTH TWICE DAILY. TAKE 3 DAYS AT A TIME AS NEEDED    Dispense:  30 tablet    Refill:  1    Orders Placed This Encounter  Procedures   Varicella-zoster vaccine IM   Ambulatory referral to Gastroenterology    Referral Priority:   Routine    Referral Type:   Consultation    Referral Reason:   Specialty Services Required    Number of Visits Requested:   1     Patient instructions: We will refer you back for colonoscopy.  First shingrix vaccine today. Return in 2-6 months to complete series.  Work on diet changes to help improve cholesterol levels.  Return as needed or in 1 year for next physical.   Follow up plan: Return in about 1 year (around 10/22/2021), or if symptoms worsen or fail to improve, for annual exam, prior fasting for blood work.  Ria Bush, MD  3

## 2020-10-21 NOTE — Assessment & Plan Note (Signed)
Preventative protocols reviewed and updated unless pt declined. Discussed healthy diet and lifestyle.  

## 2020-10-22 ENCOUNTER — Ambulatory Visit (INDEPENDENT_AMBULATORY_CARE_PROVIDER_SITE_OTHER): Payer: BC Managed Care – PPO | Admitting: Family Medicine

## 2020-10-22 ENCOUNTER — Encounter: Payer: Self-pay | Admitting: Family Medicine

## 2020-10-22 ENCOUNTER — Other Ambulatory Visit: Payer: Self-pay

## 2020-10-22 VITALS — BP 116/78 | HR 82 | Temp 98.3°F | Ht 70.25 in | Wt 225.6 lb

## 2020-10-22 DIAGNOSIS — Z1211 Encounter for screening for malignant neoplasm of colon: Secondary | ICD-10-CM | POA: Diagnosis not present

## 2020-10-22 DIAGNOSIS — E669 Obesity, unspecified: Secondary | ICD-10-CM

## 2020-10-22 DIAGNOSIS — Z23 Encounter for immunization: Secondary | ICD-10-CM

## 2020-10-22 DIAGNOSIS — E785 Hyperlipidemia, unspecified: Secondary | ICD-10-CM | POA: Insufficient documentation

## 2020-10-22 DIAGNOSIS — F3176 Bipolar disorder, in full remission, most recent episode depressed: Secondary | ICD-10-CM

## 2020-10-22 DIAGNOSIS — Z0000 Encounter for general adult medical examination without abnormal findings: Secondary | ICD-10-CM | POA: Diagnosis not present

## 2020-10-22 DIAGNOSIS — Z8249 Family history of ischemic heart disease and other diseases of the circulatory system: Secondary | ICD-10-CM

## 2020-10-22 DIAGNOSIS — A6000 Herpesviral infection of urogenital system, unspecified: Secondary | ICD-10-CM

## 2020-10-22 MED ORDER — VALACYCLOVIR HCL 500 MG PO TABS: ORAL_TABLET | ORAL | 1 refills | Status: DC

## 2020-10-22 NOTE — Assessment & Plan Note (Signed)
Encouraged healthy diet and lifestyle choices to affect sustainable weight loss.  ?

## 2020-10-22 NOTE — Assessment & Plan Note (Signed)
Father was smoker.

## 2020-10-22 NOTE — Assessment & Plan Note (Signed)
Reviewed elevated cholesterol levels as well as diet choices to improve readings namely trig and LDL. Reviewed ASCVD risk.  The 10-year ASCVD risk score Mikey Bussing DC Brooke Bonito., et al., 2013) is: 5.7%   Values used to calculate the score:     Age: 55 years     Sex: Male     Is Non-Hispanic African American: No     Diabetic: No     Tobacco smoker: No     Systolic Blood Pressure: 466 mmHg     Is BP treated: No     HDL Cholesterol: 49.5 mg/dL     Total Cholesterol: 216 mg/dL

## 2020-10-22 NOTE — Assessment & Plan Note (Signed)
Refill valtrex PRN.

## 2020-10-22 NOTE — Assessment & Plan Note (Signed)
Appreciate psych care.  Continues lithium and abilify.

## 2020-10-22 NOTE — Patient Instructions (Addendum)
We will refer you back for colonoscopy.  First shingrix vaccine today. Return in 2-6 months to complete series.  Work on diet changes to help improve cholesterol levels.  Return as needed or in 1 year for next physical.   Health Maintenance, Male Adopting a healthy lifestyle and getting preventive care are important in promoting health and wellness. Ask your health care provider about: The right schedule for you to have regular tests and exams. Things you can do on your own to prevent diseases and keep yourself healthy. What should I know about diet, weight, and exercise? Eat a healthy diet  Eat a diet that includes plenty of vegetables, fruits, low-fat dairy products, and lean protein. Do not eat a lot of foods that are high in solid fats, added sugars, or sodium.  Maintain a healthy weight Body mass index (BMI) is a measurement that can be used to identify possible weight problems. It estimates body fat based on height and weight. Your health care provider can help determine your BMI and help you achieve or maintain ahealthy weight. Get regular exercise Get regular exercise. This is one of the most important things you can do for your health. Most adults should: Exercise for at least 150 minutes each week. The exercise should increase your heart rate and make you sweat (moderate-intensity exercise). Do strengthening exercises at least twice a week. This is in addition to the moderate-intensity exercise. Spend less time sitting. Even light physical activity can be beneficial. Watch cholesterol and blood lipids Have your blood tested for lipids and cholesterol at 56 years of age, then havethis test every 5 years. You may need to have your cholesterol levels checked more often if: Your lipid or cholesterol levels are high. You are older than 56 years of age. You are at high risk for heart disease. What should I know about cancer screening? Many types of cancers can be detected early and  may often be prevented. Depending on your health history and family history, you may need to have cancer screening at various ages. This may include screening for: Colorectal cancer. Prostate cancer. Skin cancer. Lung cancer. What should I know about heart disease, diabetes, and high blood pressure? Blood pressure and heart disease High blood pressure causes heart disease and increases the risk of stroke. This is more likely to develop in people who have high blood pressure readings, are of African descent, or are overweight. Talk with your health care provider about your target blood pressure readings. Have your blood pressure checked: Every 3-5 years if you are 66-3 years of age. Every year if you are 43 years old or older. If you are between the ages of 73 and 49 and are a current or former smoker, ask your health care provider if you should have a one-time screening for abdominal aortic aneurysm (AAA). Diabetes Have regular diabetes screenings. This checks your fasting blood sugar level. Have the screening done: Once every three years after age 13 if you are at a normal weight and have a low risk for diabetes. More often and at a younger age if you are overweight or have a high risk for diabetes. What should I know about preventing infection? Hepatitis B If you have a higher risk for hepatitis B, you should be screened for this virus. Talk with your health care provider to find out if you are at risk forhepatitis B infection. Hepatitis C Blood testing is recommended for: Everyone born from [birth year] through 1966. Anyone with known  risk factors for hepatitis C. Sexually transmitted infections (STIs) You should be screened each year for STIs, including gonorrhea and chlamydia, if: You are sexually active and are younger than 56 years of age. You are older than 56 years of age and your health care provider tells you that you are at risk for this type of infection. Your sexual activity has  changed since you were last screened, and you are at increased risk for chlamydia or gonorrhea. Ask your health care provider if you are at risk. Ask your health care provider about whether you are at high risk for HIV. Your health care provider may recommend a prescription medicine to help prevent HIV infection. If you choose to take medicine to prevent HIV, you should first get tested for HIV. You should then be tested every 3 months for as long as you are taking the medicine. Follow these instructions at home: Lifestyle Do not use any products that contain nicotine or tobacco, such as cigarettes, e-cigarettes, and chewing tobacco. If you need help quitting, ask your health care provider. Do not use street drugs. Do not share needles. Ask your health care provider for help if you need support or information about quitting drugs. Alcohol use Do not drink alcohol if your health care provider tells you not to drink. If you drink alcohol: Limit how much you have to 0-2 drinks a day. Be aware of how much alcohol is in your drink. In the [place] one drink equals one 12 oz bottle of beer (355 mL), one 5 oz glass of wine (148 mL), or one 1 oz glass of hard liquor (44 mL). General instructions Schedule regular health, dental, and eye exams. Stay current with your vaccines. Tell your health care provider if: You often feel depressed. You have ever been abused or do not feel safe at home. Summary Adopting a healthy lifestyle and getting preventive care are important in promoting health and wellness. Follow your health care provider's instructions about healthy diet, exercising, and getting tested or screened for diseases. Follow your health care provider's instructions on monitoring your cholesterol and blood pressure. This information is not intended to replace advice given to you by your health care provider. Make sure you discuss any questions you have with your healthcare provider. Document  Revised: 04/17/2018 Document Reviewed: 04/17/2018 Elsevier Patient Education  2022 [employer].

## 2021-04-28 ENCOUNTER — Ambulatory Visit (INDEPENDENT_AMBULATORY_CARE_PROVIDER_SITE_OTHER)
Admission: RE | Admit: 2021-04-28 | Discharge: 2021-04-28 | Disposition: A | Discharge status: Home / Self Care | Payer: BC Managed Care – PPO | Source: Ambulatory Visit | Attending: Nurse Practitioner | Admitting: Nurse Practitioner

## 2021-04-28 ENCOUNTER — Other Ambulatory Visit: Payer: Self-pay

## 2021-04-28 ENCOUNTER — Other Ambulatory Visit: Payer: Self-pay | Admitting: Nurse Practitioner

## 2021-04-28 ENCOUNTER — Encounter: Payer: Self-pay | Admitting: Nurse Practitioner

## 2021-04-28 ENCOUNTER — Ambulatory Visit (INDEPENDENT_AMBULATORY_CARE_PROVIDER_SITE_OTHER): Payer: BC Managed Care – PPO | Admitting: Nurse Practitioner

## 2021-04-28 VITALS — BP 140/76 | HR 81 | Temp 98.0°F | Resp 12 | Ht 70.25 in | Wt 220.4 lb

## 2021-04-28 DIAGNOSIS — Z8709 Personal history of other diseases of the respiratory system: Secondary | ICD-10-CM | POA: Insufficient documentation

## 2021-04-28 DIAGNOSIS — R0982 Postnasal drip: Secondary | ICD-10-CM | POA: Insufficient documentation

## 2021-04-28 DIAGNOSIS — R052 Subacute cough: Secondary | ICD-10-CM

## 2021-04-28 MED ORDER — LEVOCETIRIZINE DIHYDROCHLORIDE 5 MG PO TABS: 5.0000 mg | ORAL_TABLET | Freq: Every evening | ORAL | 0 refills | Status: AC

## 2021-04-28 MED ORDER — FLUTICASONE PROPIONATE 50 MCG/ACT NA SUSP: 2.0000 | Freq: Every day | NASAL | 0 refills | Status: AC

## 2021-04-28 NOTE — Patient Instructions (Signed)
Nice to see you today Will be in touch in regards to your chest xray Sent medications into your pharmacy Let us know or follow up in office if your symptoms fail to improve or get worse

## 2021-04-28 NOTE — Assessment & Plan Note (Signed)
Patient endorses a history of bronchitis flares younger.  Given that and a persistent cough we will pursue chest x-ray pending result.

## 2021-04-28 NOTE — Assessment & Plan Note (Signed)
Cobblestoning in oropharynx.  Patient constantly clearing throat in office do feel like postnasal drip is the driver of his cough start Flonase nasal spray.

## 2021-04-28 NOTE — Progress Notes (Signed)
Acute Office Visit  Subjective:    Patient ID: Erik Smith, male    DOB: 10/16/1965, 55 y.o.   MRN: [id number]  Chief Complaint  Patient presents with   Cough    For at least 1 month. Covid test negative x 3 to 4. Has tried Delsym, Claritin OTC, cough drops. Minimum sinus drainage. Worse morning and late in the evening. No fever.     Patient is in today for Cough  Symptoms started on approx Nov 11-18  Has covid tested several times and all negative last test was yesterday  OTC: delysum, clartin, some red tablet with out relief. Worse in the morning and at night and worse with laying down  Past Medical History:  Diagnosis Date   Arthritis    Asthma    as a child   Bipolar disorder, unspecified ([place])    hospitalization 2004 for depression, bipolar, ambien overdose   Cancer ([place]) 10/20/2015   skin ca on nose removed   Cholesteatoma 05/2010   s/p R ear surgery, residual tinnitus, planning on L ear surgery   Depression    bipolar   Family history of ischemic heart disease    History of asthma    Migraine without aura, without mention of intractable migraine without mention of status migrainosus    Rosacea 2017   Spinal stenosis    Unspecified adjustment reaction     Past Surgical History:  Procedure Laterality Date   CARPOMETACARPEL SUSPENSION PLASTY Left 10/18/2017   Procedure: LEFT THUMB TRAPEZIECTOMY AND SUSPENSIONPLASTY;  Surgeon: Leanora Cover, MD;  Location: [place];  Service: Orthopedics;  Laterality: Left;   CARPOMETACARPEL SUSPENSION PLASTY Right 03/28/2018   Procedure: CARPOMETACARPEL [facility]) SUSPENSION PLASTY AND TRAPEZIECTOMY;  Surgeon: Leanora Cover, MD;  Location: [place];  Service: Orthopedics;  Laterality: Right;   COLONOSCOPY  1999   normal (in [place])   ESOPHAGUS SURGERY  2003   torn esophagus   INNER EAR SURGERY  1975, 1995, 2012, 2013   for cholesteatoma, bilateral first L then R   NECK SURGERY  2002   spinal  stenosis and arthritis   perianal abscess  1998    Family History  Problem Relation Age of Onset   Coronary artery disease Father 101   Bladder Cancer Father    Sickle cell anemia Mother    Asthma Mother        smoker   Prostate cancer Paternal Grandfather 101       deceased from prostate CA   Lung cancer Paternal Grandmother 53       smoker   Breast cancer Paternal Uncle    Stroke Neg Hx    Diabetes Neg Hx    Colon cancer Neg Hx    Colon polyps Neg Hx    Esophageal cancer Neg Hx    Rectal cancer Neg Hx    Stomach cancer Neg Hx     Social History   Socioeconomic History   Marital status: Married    Spouse name: Not on file   Number of children: 2   Years of education: Not on file   Highest education level: Not on file  Occupational History   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     Employer: [employer]  Tobacco Use   Smoking status: Never   Smokeless tobacco: Never  Vaping Use   Vaping Use: Never used  Substance and Sexual Activity   Alcohol use: Yes    Alcohol/week: 0.0 standard  drinks    Comment: 1 beer/day   Drug use: No   Sexual activity: Not on file  Other Topics Concern   Not on file  Social History Narrative   Lives with wife    Occ: 5th grade teacher at [employer], now Environmental consultant principal at [employer].    Activity: occ running   Diet: Not a lot of fast food   Social Determinants of Health   Financial Resource Strain: Not on file  Food Insecurity: Not on file  Transportation Needs: Not on file  Physical Activity: Not on file  Stress: Not on file  Social Connections: Not on file  Intimate Partner Violence: Not on file    Outpatient Medications Prior to Visit  Medication Sig Dispense Refill   ARIPiprazole (ABILIFY) 2 MG tablet Take 2 mg by mouth at bedtime.     doxycycline (VIBRA-TABS) 100 MG tablet Take 100 mg by mouth 2 (two) times daily. As needed     ibuprofen (ADVIL) 200 MG tablet Take 3 tablets (600 mg total)  by mouth 2 (two) times daily as needed for moderate pain.     ketoconazole (NIZORAL) 2 % cream Apply topically 2 (two) times daily. As needed     lithium carbonate (LITHOBID) 300 MG CR tablet Take 2 tablets (600 mg total) by mouth 2 (two) times daily.     metroNIDAZOLE (METROGEL) 0.75 % gel SMARTSIG:1 Sparingly Topical Every Night     SULFACLEANSE 8/4 8-4 % SUSP Apply 1 application topically 2 (two) times daily. As needed     triamcinolone ointment (KENALOG) 0.1 % Apply topically 2 (two) times daily. As needed     valACYclovir (VALTREX) 500 MG tablet TAKE 1 TABLET BY MOUTH TWICE DAILY. TAKE 3 DAYS AT A TIME AS NEEDED 30 tablet 1   No facility-administered medications prior to visit.    Allergies  Allergen Reactions   Morphine And Related Itching    Review of Systems  Constitutional:  Negative for chills, fatigue and fever.  HENT:  Positive for congestion and postnasal drip. Negative for sinus pressure, sinus pain and sore throat.   Respiratory:  Positive for cough. Negative for shortness of breath.   Cardiovascular:  Negative for chest pain.  Gastrointestinal:  Negative for abdominal pain, diarrhea, nausea and vomiting.  Neurological:  Negative for dizziness, light-headedness and headaches.      Objective:    Physical Exam Vitals and nursing note reviewed.  Constitutional:      Appearance: Normal appearance.  HENT:     Right Ear: Tympanic membrane, ear canal and external ear normal.     Left Ear: Tympanic membrane, ear canal and external ear normal.     Ears:     Comments: Wears hearing aids    Mouth/Throat:     Mouth: Mucous membranes are moist.     Comments: Cobblestoning  Eyes:     Comments: Wears corrective lenses   Cardiovascular:     Rate and Rhythm: Normal rate and regular rhythm.  Pulmonary:     Effort: Pulmonary effort is normal.     Breath sounds: Normal breath sounds.  Lymphadenopathy:     Cervical: No cervical adenopathy.  Neurological:     Mental  Status: He is alert.    BP 140/76    Pulse 81    Temp 98 F (36.7 C)    Resp 12    Ht 5' 10.25" (1.784 m)    Wt 220 lb 6 oz (100 kg)  SpO2 98%    BMI 31.40 kg/m  Wt Readings from Last 3 Encounters:  04/28/21 220 lb 6 oz (100 kg)  10/22/20 225 lb 9 oz (102.3 kg)  11/27/19 225 lb (102.1 kg)    Health Maintenance Due  Topic Date Due   HIV Screening  Never done   Hepatitis C Screening  Never done   COLONOSCOPY (Pts 45-48yrs Insurance coverage will need to be confirmed)  06/08/2009   COLON CANCER SCREENING ANNUAL FOBT  05/22/2019   COVID-19 Vaccine (4 - Booster for [place] series) 04/10/2020   Zoster Vaccines- Shingrix (2 of 2) 12/17/2020    There are no preventive care reminders to display for this patient.   Lab Results  Component Value Date   TSH 1.61 10/15/2020   Lab Results  Component Value Date   WBC 4.8 10/15/2020   HGB 15.4 10/15/2020   HCT 43.7 10/15/2020   MCV 86.2 10/15/2020   PLT 214.0 10/15/2020   Lab Results  Component Value Date   NA 139 10/15/2020   K 5.1 10/15/2020   CO2 27 10/15/2020   GLUCOSE 92 10/15/2020   BUN 19 10/15/2020   CREATININE 0.91 10/15/2020   BILITOT 0.5 10/15/2020   ALKPHOS 89 10/15/2020   AST 24 10/15/2020   ALT 38 10/15/2020   PROT 6.9 10/15/2020   ALBUMIN 4.5 10/15/2020   CALCIUM 9.6 10/15/2020   ANIONGAP 8 09/24/2018   GFR 94.32 10/15/2020   Lab Results  Component Value Date   CHOL 216 (H) 10/15/2020   Lab Results  Component Value Date   HDL 49.50 10/15/2020   Lab Results  Component Value Date   LDLCALC 95 04/29/2018   Lab Results  Component Value Date   TRIG 213.0 (H) 10/15/2020   Lab Results  Component Value Date   CHOLHDL 4 10/15/2020   No results found for: HGBA1C     Assessment & Plan:   Problem List Items Addressed This Visit       Other   Subacute cough    Patient having a subacute cough no other bacterial or infectious symptoms or signs.  We will start him on Flonase with nasal steroid  precautions.  Will get chest x-ray pending result follow-up if not improving.      Relevant Medications   levocetirizine (XYZAL) 5 MG tablet   Other Relevant Orders   DG Chest 2 View (Completed)   PND (post-nasal drip) - Primary    Cobblestoning in oropharynx.  Patient constantly clearing throat in office do feel like postnasal drip is the driver of his cough start Flonase nasal spray.      Relevant Medications   levocetirizine (XYZAL) 5 MG tablet   fluticasone (FLONASE) 50 MCG/ACT nasal spray   History of bronchitis    Patient endorses a history of bronchitis flares younger.  Given that and a persistent cough we will pursue chest x-ray pending result.      Relevant Orders   DG Chest 2 View (Completed)     No orders of the defined types were placed in this encounter.  This visit occurred during the SARS-CoV-2 public health emergency.  Safety protocols were in place, including screening questions prior to the visit, additional usage of staff PPE, and extensive cleaning of exam room while observing appropriate contact time as indicated for disinfecting solutions.   Romilda Garret, NP

## 2021-04-28 NOTE — Assessment & Plan Note (Signed)
Patient having a subacute cough no other bacterial or infectious symptoms or signs.  We will start him on Flonase with nasal steroid precautions.  Will get chest x-ray pending result follow-up if not improving.

## 2021-05-23 ENCOUNTER — Other Ambulatory Visit: Payer: Self-pay | Admitting: Nurse Practitioner

## 2021-05-23 DIAGNOSIS — R0982 Postnasal drip: Secondary | ICD-10-CM

## 2021-05-23 DIAGNOSIS — R052 Subacute cough: Secondary | ICD-10-CM

## 2021-06-13 ENCOUNTER — Ambulatory Visit (INDEPENDENT_AMBULATORY_CARE_PROVIDER_SITE_OTHER): Payer: BC Managed Care – PPO | Admitting: Psychology

## 2021-06-13 DIAGNOSIS — F319 Bipolar disorder, unspecified: Secondary | ICD-10-CM | POA: Diagnosis not present

## 2021-06-13 NOTE — Progress Notes (Signed)
[facility] Counselor Initial Adult Exam  Name: Erik Smith Date: 06/13/2021 MRN: [id number] DOB: 1966/03/30 PCP: Ria Bush, MD  Time Spent: 4:00  pm - 5:00 pm: 60 Minutes   [name] participated from home, via video, and consented to treatment. Therapist participated from home office. We met online due to [place] pandemic.   Guardian/Payee:  N/A    Paperwork requested: No   Reason for Visit /Presenting Problem: Improve/manage feeling of agitation  Mental Status Exam: Appearance:   Fairly Groomed     Behavior:  Appropriate  Motor:  Normal  Speech/Language:   Normal Rate  Affect:  Appropriate  Mood:  normal  Thought process:  normal  Thought content:    WNL  Sensory/Perceptual disturbances:    WNL  Orientation:  oriented to person, place, and situation  Attention:  Good  Concentration:  Good  Memory:  WNL  Fund of knowledge:   Good  Insight:    Good  Judgment:   Good  Impulse Control:  Good     Reported Symptoms:  Agitation, frustration, worry  Risk Assessment: Danger to Self:  No Self-injurious Behavior: No Danger to Others: No Duty to Warn:no Physical Aggression / Violence:No  Access to Firearms a concern:  unknown Gang Involvement:No  Patient / guardian was educated about steps to take if suicide or homicide risk level increases between visits: n/a While future psychiatric events cannot be accurately predicted, the patient does not currently require acute inpatient psychiatric care and does not currently meet [facility] involuntary commitment criteria.  Substance Abuse History: Current substance abuse: No     Past Psychiatric History:   Previous psychological history is significant for Bipolar Mania Outpatient Providers:Dr. Pervis Hocking History of Psych Hospitalization: Yes  Psychological Testing:  N/A    Abuse History:  Victim of: No.,  N/A    Report needed: No. Victim of Neglect:No. Perpetrator of  N/A   Witness /  Exposure to Domestic Violence: No   Protective Services Involvement: No  Witness to [employer] Violence:  No   Family History:  Family History  Problem Relation Age of Onset   Coronary artery disease Father 25   Bladder Cancer Father    Sickle cell anemia Mother    Asthma Mother        smoker   Prostate cancer Paternal Grandfather 53       deceased from prostate CA   Lung cancer Paternal Grandmother 1       smoker   Breast cancer Paternal Uncle    Stroke Neg Hx    Diabetes Neg Hx    Colon cancer Neg Hx    Colon polyps Neg Hx    Esophageal cancer Neg Hx    Rectal cancer Neg Hx    Stomach cancer Neg Hx     Living situation: the patient lives with their spouse  Sexual Orientation: Straight  Relationship Status: married  Name of spouse / other:Erik Smith If a parent, number of children / ages:2 boys, 81 & 89  Support Systems: family  Financial Stress:  No   Income/Employment/Disability: Employment  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 History of legal issue / charges:  N/A  Medical History/Surgical History: not reviewed Past Medical History:  Diagnosis Date   Arthritis    Asthma    as a child   Bipolar disorder, unspecified ([place])    hospitalization 2004 for depression, bipolar, ambien overdose   Cancer ([place]) 10/20/2015   skin ca on nose removed   Cholesteatoma 05/2010   s/p R ear surgery, residual tinnitus, planning on L ear surgery   Depression    bipolar   Family history of ischemic heart disease    History of asthma    Migraine without aura, without  mention of intractable migraine without mention of status migrainosus    Rosacea 2017   Spinal stenosis    Unspecified adjustment reaction     Past Surgical History:  Procedure Laterality Date   CARPOMETACARPEL SUSPENSION PLASTY Left 10/18/2017   Procedure: LEFT THUMB TRAPEZIECTOMY AND SUSPENSIONPLASTY;  Surgeon: Leanora Cover, MD;  Location: [facility];  Service: Orthopedics;  Laterality: Left;   CARPOMETACARPEL SUSPENSION PLASTY Right 03/28/2018   Procedure: CARPOMETACARPEL [facility]) SUSPENSION PLASTY AND TRAPEZIECTOMY;  Surgeon: Leanora Cover, MD;  Location: [place];  Service: Orthopedics;  Laterality: Right;   COLONOSCOPY  1999   normal (in [place])   ESOPHAGUS SURGERY  2003   torn esophagus   [place], 2012, 2013   for cholesteatoma, bilateral first L then R   NECK SURGERY  2002   spinal stenosis and arthritis   perianal abscess  1998    Medications: Current Outpatient Medications  Medication Sig Dispense Refill   ARIPiprazole (ABILIFY) 2 MG tablet Take 2 mg by mouth at bedtime.     doxycycline (VIBRA-TABS) 100 MG tablet Take 100 mg by mouth 2 (two) times daily. As needed     fluticasone (FLONASE) 50 MCG/ACT nasal spray Place 2 sprays into both nostrils daily. 16 g 0   ibuprofen (ADVIL) 200 MG tablet Take 3 tablets (600 mg total) by mouth 2 (two) times daily as needed for moderate pain.     ketoconazole (NIZORAL) 2 % cream Apply topically 2 (two) times daily. As needed     levocetirizine (XYZAL) 5 MG tablet Take 1 tablet (5 mg total) by mouth every evening. 30 tablet 0   lithium carbonate (LITHOBID) 300 MG CR tablet Take 2 tablets (600 mg total) by mouth 2 (two) times daily.     metroNIDAZOLE (METROGEL) 0.75 % gel SMARTSIG:1 Sparingly Topical Every Night     SULFACLEANSE 8/4 8-4 % SUSP Apply 1 application topically 2 (two) times daily. As needed     triamcinolone ointment (KENALOG) 0.1 % Apply topically 2 (two) times daily.  As needed     valACYclovir (VALTREX) 500 MG tablet TAKE 1 TABLET BY MOUTH TWICE DAILY. TAKE 3 DAYS AT A TIME AS NEEDED 30 tablet 1   No current facility-administered medications for this visit.    Allergies  Allergen Reactions   Morphine And Related Itching   Initial Session Note: States he was remarried in October to girlfriend of 7 years. He has 2 sons (60 and 90). Has 3 granddaughters (44 yr old twins and 51 years old). His 34 year old son and his kids live in [place]. 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 In [place] and [place]. In past. In 2004 was in hospital while going through divorce. Was taking anti depressants. He was bipolar and it threw him into a manic  episode. Was in hospital for weekend after being up for several days. Has a history of trying lots of meds. Now on Lithium and Abilify and it seems to work. Prescribed by Dr. Rise Paganini in [place]. Has 10 year history with Dr. Rexene Edison for psychotherapy. They worked on strategies to deal with stress. The past 6-7 months have been stressful, mostly due to work. 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 He went to [employer] with the hopes to get a degree that would allow him to become a principal. Found out in June that his twitter account was compromised and there was inappropriate material on the account. He has been very frustrated and has been told that this will keep him from getting an Scientist, research (physical sciences) position. The account had political things and nude pictures. He claims he had no idea and maintains he is not "self-destructive". Told they cannot recommend him for an advancement to assistant principal. He was only 1 of three out of thirty in the program that did not get the position. Still has no idea how this could have happened. He had to go back to the classroom and some days he really struggles. He has an "amazing family" that has been supportive. When he found out, he says "I physically could not  speak". Says his wife has been "a rock" throughout all of this. He is still applying for Asst. Prin. positions outside of [facility].  He has his 63 year old son every other weekend. Son is a Museum/gallery exhibitions officer at [employer]. He reports that he understands himself and knows he can be difficult. During all the problems this summer, he was looking for a house down here for his 57 year old mother than is start of having dementia. Two weeks before closing, she decided not to move. She also did not come to his wedding because her "cat had fleas". In Nov., she decided she wants to move. He went to [facility]. And picked her up and brought her here to move into a rental home. After about 1 week they found a great house and then she informs him that she isn't here to buy a house. Last week, they drove her back to [place]. He talks with her daily, but she usually does not remember. He has 2 younger brothers, one of whom is 15 minutes away. He tells Kiree he cannot be bothered with their mother. [place] says that his brother makes him miserable. Other brother brother is in [place]. His wife [name] tells him he is too often not in a good place. This has mostly been since the wedding.  [name] has  a 64 year old daughter that lives with them and has anxiety issues and is not working because of this condition. She has been with them since May. She has a Oceanographer in social work, went to internship and then moved in with them. She also has a 68 year old son who is an Optometrist in [place]. [name] is a Marine scientist at [employer].  Last school year he took a leave of absence because he was working as a Engineer, maintenance.  He struggles to understand what happened to his twitter account and is being told by some that "there may be more to it". He is thinking (questioning) is he upset someone along the way.  What do you want from therapy? He says the benefit in the past of counseling is to have a neutral party to share his  upsets  and frustrations. He also is seeking guidance with identifying strategies to manage feelings.           Diagnoses:  Bi Polar Disorder  Plan of Care: Outpatient Therapy and psychotropic medication   Marcelina Morel, PhD Time:4:00-5:00p 60 min.

## 2021-06-27 ENCOUNTER — Ambulatory Visit: Payer: BC Managed Care – PPO | Admitting: Psychology

## 2021-07-11 ENCOUNTER — Ambulatory Visit (INDEPENDENT_AMBULATORY_CARE_PROVIDER_SITE_OTHER): Payer: BC Managed Care – PPO | Admitting: Psychology

## 2021-07-11 DIAGNOSIS — F319 Bipolar disorder, unspecified: Secondary | ICD-10-CM | POA: Diagnosis not present

## 2021-07-11 NOTE — Progress Notes (Signed)
? ? ? ? ? ? ? ? ? ? ? ? ? ? ? ?  [place] Counselor Initial Adult Exam ? ?Name: Erik Smith ?Date: 07/11/2021 ?MRN: [id number] ?DOB: [birth year] ?PCP: Ria Bush, MD ? ?Time Spent: 4:05  pm - 5:00 pm: 55 Minutes ? ? ?[name] participated from home, via video, and consented to treatment. Therapist participated from home office. We met online due to [place] pandemic. ? ? ?Guardian/Payee:  N/A   ? ?Paperwork requested: No  ? ?Reason for Visit /Presenting Problem: Improve/manage feeling of agitation ? ?Mental Status Exam: ?Appearance:   Fairly Groomed     ?Behavior:  Appropriate  ?Motor:  Normal  ?Speech/Language:   Normal Rate  ?Affect:  Appropriate  ?Mood:  normal  ?Thought process:  normal  ?Thought content:    WNL  ?Sensory/Perceptual disturbances:    WNL  ?Orientation:  oriented to person, place, and situation  ?Attention:  Good  ?Concentration:  Good  ?Memory:  WNL  ?Fund of knowledge:   Good  ?Insight:    Good  ?Judgment:   Good  ?Impulse Control:  Good  ? ? ? ?Reported Symptoms:  Agitation, frustration, worry ? ?Risk Assessment: ?Danger to Self:  No ?Self-injurious Behavior: No ?Danger to Others: No ?Duty to Warn:no ?Physical Aggression / Violence:No  ?Access to Firearms a concern:  unknown ?Gang Involvement:No  ?Patient / guardian was educated about steps to take if suicide or homicide risk level increases between visits: n/a ?While future psychiatric events cannot be accurately predicted, the patient does not currently require acute inpatient psychiatric care and does not currently meet [facility] involuntary commitment criteria. ? ?Substance Abuse History: ?Current substance abuse: No    ? ?Past Psychiatric History:   ?Previous psychological history is significant for Bipolar Mania ?Outpatient Providers:Dr. Pervis Hocking ?History of Psych Hospitalization: Yes  ?Psychological Testing:  N/A   ? ?Abuse History:  ?Victim of: No.,  N/A    ?Report needed: No. ?Victim of  Neglect:No. ?Perpetrator of  N/A   ?Witness / Exposure to Domestic Violence: No   ?Protective Services Involvement: No  ?Witness to [employer] Violence:  No  ? ?Family History:  ?Family History  ?Problem Relation Age of Onset  ? Coronary artery disease Father 60  ? Bladder Cancer Father   ? Sickle cell anemia Mother   ? Asthma Mother   ?     smoker  ? Prostate cancer Paternal Grandfather 70  ?     deceased from prostate CA  ? Lung cancer Paternal Grandmother 44  ?     smoker  ? Breast cancer Paternal Uncle   ? Stroke Neg Hx   ? Diabetes Neg Hx   ? Colon cancer Neg Hx   ? Colon polyps Neg Hx   ? Esophageal cancer Neg Hx   ? Rectal cancer Neg Hx   ? Stomach cancer Neg Hx   ? ? ?Living situation: the patient lives with their spouse ? ?Sexual Orientation: Straight ? ?Relationship Status: married  ?Name of spouse / other:Erik Smith ?If a parent, number of children / ages:2 boys, 42 & 66 ? ?Support Systems: family ? ?Financial Stress:  No  ? ?Income/Employment/Disability: Employment ? ?Military Service:  unknown ? ?Educational History: ?Education: post Forensic psychologist work or degree ? ?Religion/Sprituality/World View: ?unknown ? ?Any cultural differences that may affect / interfere with treatment:  not applicable  ? ?Recreation/Hobbies: woodworking ? ?Stressors: Occupational concerns   ? ?Strengths: Family ? ?Barriers:  unknown  ? ?  Legal History: ?Pending legal issue / charges: The patient has no significant history of legal issues. ?History of legal issue / charges:  N/A ? ?Medical History/Surgical History: not reviewed ?Past Medical History:  ?Diagnosis Date  ? Arthritis   ? Asthma   ? as a child  ? Bipolar disorder, unspecified ([place])   ? hospitalization 2004 for depression, bipolar, ambien overdose  ? Cancer ([place]) 10/20/2015  ? skin ca on nose removed  ? Cholesteatoma 05/2010  ? s/p R ear surgery, residual tinnitus, planning on L ear surgery  ? Depression   ? bipolar  ? Family history of ischemic heart disease   ? History  of asthma   ? Migraine without aura, without mention of intractable migraine without mention of status migrainosus   ? Rosacea 2017  ? Spinal stenosis   ? Unspecified adjustment reaction   ? ? ?Past Surgical History:  ?Procedure Laterality Date  ? CARPOMETACARPEL SUSPENSION PLASTY Left 10/18/2017  ? Procedure: LEFT THUMB TRAPEZIECTOMY AND SUSPENSIONPLASTY;  Surgeon: Leanora Cover, MD;  Location: [place];  Service: Orthopedics;  Laterality: Left;  ? CARPOMETACARPEL SUSPENSION PLASTY Right 03/28/2018  ? Procedure: CARPOMETACARPEL ([place]) SUSPENSION PLASTY AND TRAPEZIECTOMY;  Surgeon: Leanora Cover, MD;  Location: [place];  Service: Orthopedics;  Laterality: Right;  ? COLONOSCOPY  1999  ? normal (in [place])  ? ESOPHAGUS SURGERY  2003  ? torn esophagus  ? [place], 2012, 2013  ? for cholesteatoma, bilateral first L then R  ? NECK SURGERY  2002  ? spinal stenosis and arthritis  ? perianal abscess  1998  ? ? ?Medications: ?Current Outpatient Medications  ?Medication Sig Dispense Refill  ? ARIPiprazole (ABILIFY) 2 MG tablet Take 2 mg by mouth at bedtime.    ? doxycycline (VIBRA-TABS) 100 MG tablet Take 100 mg by mouth 2 (two) times daily. As needed    ? fluticasone (FLONASE) 50 MCG/ACT nasal spray Place 2 sprays into both nostrils daily. 16 g 0  ? ibuprofen (ADVIL) 200 MG tablet Take 3 tablets (600 mg total) by mouth 2 (two) times daily as needed for moderate pain.    ? ketoconazole (NIZORAL) 2 % cream Apply topically 2 (two) times daily. As needed    ? levocetirizine (XYZAL) 5 MG tablet Take 1 tablet (5 mg total) by mouth every evening. 30 tablet 0  ? lithium carbonate (LITHOBID) 300 MG CR tablet Take 2 tablets (600 mg total) by mouth 2 (two) times daily.    ? metroNIDAZOLE (METROGEL) 0.75 % gel SMARTSIG:1 Sparingly Topical Every Night    ? SULFACLEANSE 8/4 8-4 % SUSP Apply 1 application topically 2 (two) times daily. As needed    ? triamcinolone ointment (KENALOG)  0.1 % Apply topically 2 (two) times daily. As needed    ? valACYclovir (VALTREX) 500 MG tablet TAKE 1 TABLET BY MOUTH TWICE DAILY. TAKE 3 DAYS AT A TIME AS NEEDED 30 tablet 1  ? ?No current facility-administered medications for this visit.  ? ? ?Allergies  ?Allergen Reactions  ? Morphine And Related Itching  ? ?Session Notes: ?He states that in the past couple of weeks he had interview for [employer]. He is trying to get into the pool of applicants to be considered. It is done this way in [facility] and [facility] because of the size of [employer]. He feels the interviews went well (with 3 different people). Last year he only applied to [facility], but is  casting a wider net this year.  ?Lot of anxiety with girlfriend's daughter. He has raised his kids very different than the way she has raised her kids. He is feeling good and optimistic in general. No mood swings.          ? ? ? ? ?Diagnoses:  ?Bi Polar Disorder ? ?Plan of Care: Outpatient Therapy and psychotropic medication ? ? ?Marcelina Morel, PhD Time:4:05-5:00p 55 min. ? ? ? ? ? ? ? ? ? ? ? ? ? ? ? ?

## 2021-07-25 ENCOUNTER — Ambulatory Visit (INDEPENDENT_AMBULATORY_CARE_PROVIDER_SITE_OTHER): Payer: BC Managed Care – PPO | Admitting: Psychology

## 2021-07-25 DIAGNOSIS — F319 Bipolar disorder, unspecified: Secondary | ICD-10-CM

## 2021-07-25 NOTE — Progress Notes (Addendum)
? ? ? ? ? ? ? ? ? ? ? ? ? ? ?[facility] Counselor Initial Adult Exam ? ?Name: Erik Smith ?Date: 07/25/2021 ?MRN: [id number] ?DOB: 08/23/1965 ?PCP: Ria Bush, MD ? ?Time Spent: 4:05  pm - 5:00 pm: 55 Minutes ? ? ?Erik Smith participated from home, via video, and consented to treatment. Therapist participated from home office. We met online due to [place] pandemic. ? ? ?Guardian/Payee:  N/A   ? ?Paperwork requested: No  ? ?Reason for Visit /Presenting Problem: Improve/manage feeling of agitation ? ?Mental Status Exam: ?Appearance:   Fairly Groomed     ?Behavior:  Appropriate  ?Motor:  Normal  ?Speech/Language:   Normal Rate  ?Affect:  Appropriate  ?Mood:  normal  ?Thought process:  normal  ?Thought content:    WNL  ?Sensory/Perceptual disturbances:    WNL  ?Orientation:  oriented to person, place, and situation  ?Attention:  Good  ?Concentration:  Good  ?Memory:  WNL  ?Fund of knowledge:   Good  ?Insight:    Good  ?Judgment:   Good  ?Impulse Control:  Good  ? ? ? ?Reported Symptoms:  Agitation, frustration, worry ? ?Risk Assessment: ?Danger to Self:  No ?Self-injurious Behavior: No ?Danger to Others: No ?Duty to Warn:no ?Physical Aggression / Violence:No  ?Access to Firearms a concern:  unknown ?Gang Involvement:No  ?Patient / guardian was educated about steps to take if suicide or homicide risk level increases between visits: n/a ?While future psychiatric events cannot be accurately predicted, the patient does not currently require acute inpatient psychiatric care and does not currently meet [facility] Smith involuntary commitment criteria. ? ?Substance Abuse History: ?Current substance abuse: No    ? ?Past Psychiatric History:   ?Previous psychological history is significant for Bipolar Mania ?Outpatient Providers:Dr. Pervis Hocking ?History of Psych Hospitalization: Yes  ?Psychological Testing:  N/A   ? ?Abuse History:  ?Victim of: No.,  N/A    ?Report needed: No. ?Victim of  Neglect:No. ?Perpetrator of  N/A   ?Witness / Exposure to Domestic Violence: No   ?Protective Services Involvement: No  ?Witness to [employer] Violence:  No  ? ?Family History:  ?Family History  ?Problem Relation Age of Onset  ? Coronary artery disease Father 59  ? Bladder Cancer Father   ? Sickle cell anemia Mother   ? Asthma Mother   ?     smoker  ? Prostate cancer Paternal Grandfather 59  ?     deceased from prostate CA  ? Lung cancer Paternal Grandmother 68  ?     smoker  ? Breast cancer Paternal Uncle   ? Stroke Neg Hx   ? Diabetes Neg Hx   ? Colon cancer Neg Hx   ? Colon polyps Neg Hx   ? Esophageal cancer Neg Hx   ? Rectal cancer Neg Hx   ? Stomach cancer Neg Hx   ? ? ?Living situation: the patient lives with their spouse ? ?Sexual Orientation: Straight ? ?Relationship Status: married  ?Name of spouse / other:Erik Smith ?If a parent, number of children / ages:2 boys, 44 & 64 ? ?Support Systems: family ? ?Financial Stress:  No  ? ?Income/Employment/Disability: Employment ? ?Military Service:  unknown ? ?Educational History: ?Education: post Forensic psychologist work or degree ? ?Religion/Sprituality/World View: ?unknown ? ?Any cultural differences that may affect / interfere with treatment:  not applicable  ? ?Recreation/Hobbies: woodworking ? ?Stressors: Occupational concerns   ? ?Strengths: Family ? ?Barriers:  unknown  ? ?Legal History: ?  Pending legal issue / charges: The patient has no significant history of legal issues. ?History of legal issue / charges:  N/A ? ?Medical History/Surgical History: not reviewed ?Past Medical History:  ?Diagnosis Date  ? Arthritis   ? Asthma   ? as a child  ? Bipolar disorder, unspecified ([place])   ? hospitalization 2004 for depression, bipolar, ambien overdose  ? Cancer ([place]) 10/20/2015  ? skin ca on nose removed  ? Cholesteatoma 05/2010  ? s/p R ear surgery, residual tinnitus, planning on L ear surgery  ? Depression   ? bipolar  ? Family history of ischemic heart disease   ? History  of asthma   ? Migraine without aura, without mention of intractable migraine without mention of status migrainosus   ? Rosacea 2017  ? Spinal stenosis   ? Unspecified adjustment reaction   ? ? ?Past Surgical History:  ?Procedure Laterality Date  ? CARPOMETACARPEL SUSPENSION PLASTY Left 10/18/2017  ? Procedure: LEFT THUMB TRAPEZIECTOMY AND SUSPENSIONPLASTY;  Surgeon: Leanora Cover, MD;  Location: [place];  Service: Orthopedics;  Laterality: Left;  ? CARPOMETACARPEL SUSPENSION PLASTY Right 03/28/2018  ? Procedure: CARPOMETACARPEL ([place]) SUSPENSION PLASTY AND TRAPEZIECTOMY;  Surgeon: Leanora Cover, MD;  Location: [place];  Service: Orthopedics;  Laterality: Right;  ? COLONOSCOPY  1999  ? normal (in [place])  ? ESOPHAGUS SURGERY  2003  ? torn esophagus  ? [place], 2012, 2013  ? for cholesteatoma, bilateral first L then R  ? NECK SURGERY  2002  ? spinal stenosis and arthritis  ? perianal abscess  1998  ? ? ?Medications: ?Current Outpatient Medications  ?Medication Sig Dispense Refill  ? ARIPiprazole (ABILIFY) 2 MG tablet Take 2 mg by mouth at bedtime.    ? doxycycline (VIBRA-TABS) 100 MG tablet Take 100 mg by mouth 2 (two) times daily. As needed    ? fluticasone (FLONASE) 50 MCG/ACT nasal spray Place 2 sprays into both nostrils daily. 16 g 0  ? ibuprofen (ADVIL) 200 MG tablet Take 3 tablets (600 mg total) by mouth 2 (two) times daily as needed for moderate pain.    ? ketoconazole (NIZORAL) 2 % cream Apply topically 2 (two) times daily. As needed    ? levocetirizine (XYZAL) 5 MG tablet Take 1 tablet (5 mg total) by mouth every evening. 30 tablet 0  ? lithium carbonate (LITHOBID) 300 MG CR tablet Take 2 tablets (600 mg total) by mouth 2 (two) times daily.    ? metroNIDAZOLE (METROGEL) 0.75 % gel SMARTSIG:1 Sparingly Topical Every Night    ? SULFACLEANSE 8/4 8-4 % SUSP Apply 1 application topically 2 (two) times daily. As needed    ? triamcinolone ointment (KENALOG)  0.1 % Apply topically 2 (two) times daily. As needed    ? valACYclovir (VALTREX) 500 MG tablet TAKE 1 TABLET BY MOUTH TWICE DAILY. TAKE 3 DAYS AT A TIME AS NEEDED 30 tablet 1  ? ?No current facility-administered medications for this visit.  ? ? ?Allergies  ?Allergen Reactions  ? Morphine And Related Itching  ? ?Session Notes: ?Deakin states that he is doing some "performance tasks for Erik Smith". 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 A week ago Saturday he interviewed for a job in [place] and on Monday he got a rejection. He still has not received feedback. States it felt like a gut punch. He has a good perspective on this rejection and his wife is very supportive.  He is at a loss for understanding what happened and why he was no selected, He completely misjudged the interaction at the interview. Talked about strategy to manage his feelings and the application process moving forward. He did well under these difficult circumstances. ? ?Goals: Patient is seeking counseling for mood stabilization. Will utilize cognitive strategies to help manage feelings of anxiety and despair. Target date is 12-23. Monitor medication compliance -Ongoing. ? ?          ? ? ? ? ?Diagnoses:  ?Bi Polar Disorder ? ?Plan of Care: Outpatient Therapy and psychotropic medication ? ? ?Marcelina Morel, PhD Time:4:05-5:00p 55 min. ? ? ? ? ? ? ? ? ? ? ? ? ? ? ? ?

## 2021-08-08 ENCOUNTER — Ambulatory Visit: Payer: BC Managed Care – PPO | Admitting: Psychology

## 2021-08-22 ENCOUNTER — Ambulatory Visit (INDEPENDENT_AMBULATORY_CARE_PROVIDER_SITE_OTHER): Payer: BC Managed Care – PPO | Admitting: Psychology

## 2021-08-22 DIAGNOSIS — F319 Bipolar disorder, unspecified: Secondary | ICD-10-CM

## 2021-08-22 NOTE — Progress Notes (Addendum)
[place] Counselor Initial Adult Exam  Name: Erik Smith Date: 08/22/2021 MRN: [id number] DOB: 30-Sep-1965 PCP: Ria Bush, MD  Time Spent: 4:05  pm - 5:00 pm: 42 Minutes   Erik Smith participated from home, via video, and consented to treatment. Therapist participated from home office. We met online due to [place] pandemic.   Guardian/Payee:  N/A    Paperwork requested: No   Reason for Visit /Presenting Problem: Improve/manage feeling of agitation  Mental Status Exam: Appearance:   Fairly Groomed     Behavior:  Appropriate  Motor:  Normal  Speech/Language:   Normal Rate  Affect:  Appropriate  Mood:  normal  Thought process:  normal  Thought content:    WNL  Sensory/Perceptual disturbances:    WNL  Orientation:  oriented to person, place, and situation  Attention:  Good  Concentration:  Good  Memory:  WNL  Fund of knowledge:   Good  Insight:    Good  Judgment:   Good  Impulse Control:  Good     Reported Symptoms:  Agitation, frustration, worry  Risk Assessment: Danger to Self:  No Self-injurious Behavior: No Danger to Others: No Duty to Warn:no Physical Aggression / Violence:No  Access to Firearms a concern:  unknown Gang Involvement:No  Patient / guardian was educated about steps to take if suicide or homicide risk level increases between visits: n/a While future psychiatric events cannot be accurately predicted, the patient does not currently require acute inpatient psychiatric care and does not currently meet [facility] involuntary commitment criteria.  Substance Abuse History: Current substance abuse: No     Past Psychiatric History:   Previous psychological history is significant for Bipolar Mania Outpatient Providers:Dr. Pervis Hocking History of Psych Hospitalization: Yes  Psychological Testing:  N/A    Abuse History:  Victim of: No.,  N/A    Report needed: No. Victim of Neglect:No. Perpetrator of  N/A    Witness / Exposure to Domestic Violence: No   Protective Services Involvement: No  Witness to [employer] Violence:  No   Family History:  Family History  Problem Relation Age of Onset   Coronary artery disease Father 77   Bladder Cancer Father    Sickle cell anemia Mother    Asthma Mother        smoker   Prostate cancer Paternal Grandfather 67       deceased from prostate CA   Lung cancer Paternal Grandmother 17       smoker   Breast cancer Paternal Uncle    Stroke Neg Hx    Diabetes Neg Hx    Colon cancer Neg Hx    Colon polyps Neg Hx    Esophageal cancer Neg Hx    Rectal cancer Neg Hx    Stomach cancer Neg Hx     Living situation: the patient lives with their spouse  Sexual Orientation: Straight  Relationship Status: married  Name of spouse / other:Erik Smith If a parent, number of children / ages:2 boys, 63 & 51  Support Systems: family  Financial Stress:  No   Income/Employment/Disability: Employment  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 History of legal issue / charges:  N/A  Medical History/Surgical History: not reviewed Past Medical History:  Diagnosis Date   Arthritis    Asthma    as a child   Bipolar disorder, unspecified ([place])    hospitalization 2004 for depression, bipolar, ambien overdose   Cancer ([place]) 10/20/2015   skin ca on nose removed   Cholesteatoma 05/2010   s/p R ear surgery, residual tinnitus, planning on L ear surgery   Depression    bipolar   Family history of ischemic heart disease    History of asthma    Migraine without  aura, without mention of intractable migraine without mention of status migrainosus    Rosacea 2017   Spinal stenosis    Unspecified adjustment reaction     Past Surgical History:  Procedure Laterality Date   CARPOMETACARPEL SUSPENSION PLASTY Left 10/18/2017   Procedure: LEFT THUMB TRAPEZIECTOMY AND SUSPENSIONPLASTY;  Surgeon: Leanora Cover, MD;  Location: [place];  Service: Orthopedics;  Laterality: Left;   CARPOMETACARPEL SUSPENSION PLASTY Right 03/28/2018   Procedure: CARPOMETACARPEL [facility]) SUSPENSION PLASTY AND TRAPEZIECTOMY;  Surgeon: Leanora Cover, MD;  Location: [place];  Service: Orthopedics;  Laterality: Right;   COLONOSCOPY  1999   normal (in [place])   ESOPHAGUS SURGERY  2003   torn esophagus   [place], 2012, 2013   for cholesteatoma, bilateral first L then R   NECK SURGERY  2002   spinal stenosis and arthritis   perianal abscess  1998    Medications: Current Outpatient Medications  Medication Sig Dispense Refill   ARIPiprazole (ABILIFY) 2 MG tablet Take 2 mg by mouth at bedtime.     doxycycline (VIBRA-TABS) 100 MG tablet Take 100 mg by mouth 2 (two) times daily. As needed     fluticasone (FLONASE) 50 MCG/ACT nasal spray Place 2 sprays into both nostrils daily. 16 g 0   ibuprofen (ADVIL) 200 MG tablet Take 3 tablets (600 mg total) by mouth 2 (two) times daily as needed for moderate pain.     ketoconazole (NIZORAL) 2 % cream Apply topically 2 (two) times daily. As needed     levocetirizine (XYZAL) 5 MG tablet Take 1 tablet (5 mg total) by mouth every evening. 30 tablet 0   lithium carbonate (LITHOBID) 300 MG CR tablet Take 2 tablets (600 mg total) by mouth 2 (two) times daily.     metroNIDAZOLE (METROGEL) 0.75 % gel SMARTSIG:1 Sparingly Topical Every Night     SULFACLEANSE 8/4 8-4 % SUSP Apply 1 application topically 2 (two) times daily. As needed     triamcinolone ointment (KENALOG) 0.1 % Apply topically 2 (two)  times daily. As needed     valACYclovir (VALTREX) 500 MG tablet TAKE 1 TABLET BY MOUTH TWICE DAILY. TAKE 3 DAYS AT A TIME AS NEEDED 30 tablet 1   No current facility-administered medications for this visit.    Allergies  Allergen Reactions   Morphine And Related Itching   Goals: Patient has a history of Bi Polar disorder for which he is medicated. Seeking counseling to address symptom relief associated with current personal and professional stressors. He reports increased agitation and anger. Fears it will get out of control and wants to explore strategies to manage. Will utilize individual psychotherapy (behavioral and insight oriented) to help him understand reactions and the role his history plays in his responses. Short term goal is to stabilize agitation and foster greater  control over emotional responses. Long term goal is insight into triggers that precipitate inappropriate responses. Goal dates 12-23 Patient also reports sadness and depleted self esteem. Will explore FOO issues and experiences that contribute to these feelings. Goal is to improve self confidence and life satisfaction. Goal Date is 12-23.  Session Notes: Eliazar says no advances in his job/application process. It was Spring break last week, so he and wife went to his father's in [place]. Nine hour car ride took 12 1/2 hours due to traffic. It was a terrible visit. Father and step-mother made no effort at all. He and father have very different political views and he didn't speak to his father for 10 years.The entire trip was negative and his step-mother was very rude to [place] son. His ex-wife doesn't believe him that their son was on good behavior and did not deserve the wrath of Jarett's step-mom. We talked about his options to respond. Yuepheng says that in the past he was confrontive/combative, but "it did not serve him well". He is far less reactive to his father/step-mother than he once was. He is relatively  self-depricating with regard to his work. Will talk further about esteem and assertiveness.                  Diagnoses:  Bi Polar Disorder  Plan of Care: Outpatient Therapy and psychotropic medication   Marcelina Morel, PhD Time:4:05-5:00p 55 min.

## 2021-09-05 ENCOUNTER — Ambulatory Visit (INDEPENDENT_AMBULATORY_CARE_PROVIDER_SITE_OTHER): Payer: BC Managed Care – PPO | Admitting: Psychology

## 2021-09-05 DIAGNOSIS — F319 Bipolar disorder, unspecified: Secondary | ICD-10-CM | POA: Diagnosis not present

## 2021-09-05 NOTE — Progress Notes (Signed)
? ? ? ? ? ?[facility] Counselor Initial Adult Exam ? ?Name: Erik Smith ?Date: 09/05/2021 ?MRN: [id number] ?DOB: 1965/09/29 ?PCP: Ria Bush, MD ? ?Time Spent: 4:05  pm - 5:00 pm: 55 Minutes ? ? ?Erik Smith participated from home, via video, and consented to treatment. Therapist participated from home office. We met online due to Erik Smith pandemic. ? ? ?Guardian/Payee:  N/A   ? ?Paperwork requested: No  ? ?Reason for Visit /Presenting Problem: Improve/manage feeling of agitation ? ?Mental Status Exam: ?Appearance:   Fairly Groomed     ?Behavior:  Appropriate  ?Motor:  Normal  ?Speech/Language:   Normal Rate  ?Affect:  Appropriate  ?Mood:  normal  ?Thought process:  normal  ?Thought content:    WNL  ?Sensory/Perceptual disturbances:    WNL  ?Orientation:  oriented to person, place, and situation  ?Attention:  Good  ?Concentration:  Good  ?Memory:  WNL  ?Fund of knowledge:   Good  ?Insight:    Good  ?Judgment:   Good  ?Impulse Control:  Good  ? ? ? ?Reported Symptoms:  Agitation, frustration, worry ? ?Risk Assessment: ?Danger to Self:  No ?Self-injurious Behavior: No ?Danger to Others: No ?Duty to Warn:no ?Physical Aggression / Violence:No  ?Access to Firearms a concern:  unknown ?Gang Involvement:No  ?Patient / guardian was educated about steps to take if suicide or homicide risk level increases between visits: n/a ?While future psychiatric events cannot be accurately predicted, the patient does not currently require acute inpatient psychiatric care and does not currently meet [facility] involuntary commitment criteria. ? ?Substance Abuse History: ?Current substance abuse: No    ? ?Past Psychiatric History:   ?Previous psychological history is significant for Bipolar Mania ?Outpatient Providers:Dr. Pervis Hocking ?History of Psych Hospitalization: Yes  ?Psychological Testing:  N/A   ? ?Abuse History:  ?Victim of: No.,  N/A    ?Report needed: No. ?Victim of Neglect:No. ?Perpetrator of  N/A    ?Witness / Exposure to Domestic Violence: No   ?Protective Services Involvement: No  ?Witness to [employer] Violence:  No  ? ?Family History:  ?Family History  ?Problem Relation Age of Onset  ? Coronary artery disease Father 34  ? Bladder Cancer Father   ? Sickle cell anemia Mother   ? Asthma Mother   ?     smoker  ? Prostate cancer Paternal Grandfather 90  ?     deceased from prostate CA  ? Lung cancer Paternal Grandmother 79  ?     smoker  ? Breast cancer Paternal Uncle   ? Stroke Neg Hx   ? Diabetes Neg Hx   ? Colon cancer Neg Hx   ? Colon polyps Neg Hx   ? Esophageal cancer Neg Hx   ? Rectal cancer Neg Hx   ? Stomach cancer Neg Hx   ? ? ?Living situation: the patient lives with their spouse ? ?Sexual Orientation: Straight ? ?Relationship Status: married  ?Name of spouse / other:Erik Smith ?If a parent, number of children / ages:2 boys, 62 & 60 ? ?Support Systems: family ? ?Financial Stress:  No  ? ?Income/Employment/Disability: Employment ? ?Military Service:  unknown ? ?Educational History: ?Education: post Forensic psychologist work or degree ? ?Religion/Sprituality/World View: ?unknown ? ?Any cultural differences that may affect / interfere with treatment:  not applicable  ? ?Recreation/Hobbies: woodworking ? ?Stressors: Occupational concerns   ? ?Strengths: Family ? ?Barriers:  unknown  ? ?Legal History: ?Pending legal issue / charges: The patient has no  significant history of legal issues. ?History of legal issue / charges:  N/A ? ?Medical History/Surgical History: not reviewed ?Past Medical History:  ?Diagnosis Date  ? Arthritis   ? Asthma   ? as a child  ? Bipolar disorder, unspecified ([place])   ? hospitalization 2004 for depression, bipolar, ambien overdose  ? Cancer ([place]) 10/20/2015  ? skin ca on nose removed  ? Cholesteatoma 05/2010  ? s/p R ear surgery, residual tinnitus, planning on L ear surgery  ? Depression   ? bipolar  ? Family history of ischemic heart disease   ? History of asthma   ? Migraine without  aura, without mention of intractable migraine without mention of status migrainosus   ? Rosacea 2017  ? Spinal stenosis   ? Unspecified adjustment reaction   ? ? ?Past Surgical History:  ?Procedure Laterality Date  ? CARPOMETACARPEL SUSPENSION PLASTY Left 10/18/2017  ? Procedure: LEFT THUMB TRAPEZIECTOMY AND SUSPENSIONPLASTY;  Surgeon: Leanora Cover, MD;  Location: [place];  Service: Orthopedics;  Laterality: Left;  ? CARPOMETACARPEL SUSPENSION PLASTY Right 03/28/2018  ? Procedure: CARPOMETACARPEL ([place]) SUSPENSION PLASTY AND TRAPEZIECTOMY;  Surgeon: Leanora Cover, MD;  Location: [place];  Service: Orthopedics;  Laterality: Right;  ? COLONOSCOPY  1999  ? normal (in [place])  ? ESOPHAGUS SURGERY  2003  ? torn esophagus  ? [place], 2012, 2013  ? for cholesteatoma, bilateral first L then R  ? NECK SURGERY  2002  ? spinal stenosis and arthritis  ? perianal abscess  1998  ? ? ?Medications: ?Current Outpatient Medications  ?Medication Sig Dispense Refill  ? ARIPiprazole (ABILIFY) 2 MG tablet Take 2 mg by mouth at bedtime.    ? doxycycline (VIBRA-TABS) 100 MG tablet Take 100 mg by mouth 2 (two) times daily. As needed    ? fluticasone (FLONASE) 50 MCG/ACT nasal spray Place 2 sprays into both nostrils daily. 16 g 0  ? ibuprofen (ADVIL) 200 MG tablet Take 3 tablets (600 mg total) by mouth 2 (two) times daily as needed for moderate pain.    ? ketoconazole (NIZORAL) 2 % cream Apply topically 2 (two) times daily. As needed    ? levocetirizine (XYZAL) 5 MG tablet Take 1 tablet (5 mg total) by mouth every evening. 30 tablet 0  ? lithium carbonate (LITHOBID) 300 MG CR tablet Take 2 tablets (600 mg total) by mouth 2 (two) times daily.    ? metroNIDAZOLE (METROGEL) 0.75 % gel SMARTSIG:1 Sparingly Topical Every Night    ? SULFACLEANSE 8/4 8-4 % SUSP Apply 1 application topically 2 (two) times daily. As needed    ? triamcinolone ointment (KENALOG) 0.1 % Apply topically 2 (two)  times daily. As needed    ? valACYclovir (VALTREX) 500 MG tablet TAKE 1 TABLET BY MOUTH TWICE DAILY. TAKE 3 DAYS AT A TIME AS NEEDED 30 tablet 1  ? ?No current facility-administered medications for this visit.  ? ? ?Allergies  ?Allergen Reactions  ? Morphine And Related Itching  ? ?Goals: Patient is seeking counseling for mood stabilization. Will utilize cognitive strategies to help manage feelings of anxiety and despair. Target date is 12-23. Monitor medication compliance -Ongoing. ? ?Session Notes: ?Erik Smith says "it has been a week". He is very frustrated. Says his mother fell at home and broke her wrist 2 weeks ago. She never mentioned it to Erik Smith (and had not, at that point, gone to the doctor). Erik Smith drove to [place] over the weekend. Says  her house smells like cat urine. His brother does not do anything to help out his mother at her home. He seems to resent that Erik Smith has "power of attorney". His response to his frustration is that he is over-eating. Doesn't feel he is exercising any control. Is overwhelmed with job hunt, caring for mother, dealing with brother and gaining weight. He is trying to "make peace" with his brother, but is having to bite his tongue. His job situation has not changed and it is effecting his self-esteem. Is guilty in that he feels he is holding back his wife. We talked about strategies to calm himself and gain "control" over his situation. Needs to have realistic expectations and take charge of initiating satisfying activity.     ? ? ? ?          ? ? ? ? ?Diagnoses:  ?Bi Polar Disorder ? ?Plan of Care: Outpatient Therapy and psychotropic medication ? ? ?Erik Morel, Erik Smith Time:4:05-5:00p 55 min. ? ? ? ? ? ? ? ? ? ? ? ? ? ? ? ?

## 2021-09-19 ENCOUNTER — Ambulatory Visit: Payer: BC Managed Care – PPO | Admitting: Psychology

## 2021-09-19 DIAGNOSIS — F319 Bipolar disorder, unspecified: Secondary | ICD-10-CM

## 2021-09-19 NOTE — Progress Notes (Signed)
? ? ? ? ? ? ? ? ? ? ? ? ?[facility] Counselor Initial Adult Exam ? ?Name: Erik Smith ?Date: 09/19/2021 ?MRN: [id number] ?DOB: 1966/03/31 ?PCP: Ria Bush, MD ? ? ? ? ?[name] participated from home, via video, and consented to treatment. Therapist participated from home office. We met online due to [place] pandemic. ? ? ?Guardian/Payee:  N/A   ? ?Paperwork requested: No  ? ?Reason for Visit /Presenting Problem: Improve/manage feeling of agitation ? ?Mental Status Exam: ?Appearance:   Fairly Groomed     ?Behavior:  Appropriate  ?Motor:  Normal  ?Speech/Language:   Normal Rate  ?Affect:  Appropriate  ?Mood:  normal  ?Thought process:  normal  ?Thought content:    WNL  ?Sensory/Perceptual disturbances:    WNL  ?Orientation:  oriented to person, place, and situation  ?Attention:  Good  ?Concentration:  Good  ?Memory:  WNL  ?Fund of knowledge:   Good  ?Insight:    Good  ?Judgment:   Good  ?Impulse Control:  Good  ? ? ? ?Reported Symptoms:  Agitation, frustration, worry ? ?Risk Assessment: ?Danger to Self:  No ?Self-injurious Behavior: No ?Danger to Others: No ?Duty to Warn:no ?Physical Aggression / Violence:No  ?Access to Firearms a concern:  unknown ?Gang Involvement:No  ?Patient / guardian was educated about steps to take if suicide or homicide risk level increases between visits: n/a ?While future psychiatric events cannot be accurately predicted, the patient does not currently require acute inpatient psychiatric care and does not currently meet [facility] involuntary commitment criteria. ? ?Substance Abuse History: ?Current substance abuse: No    ? ?Past Psychiatric History:   ?Previous psychological history is significant for Bipolar Mania ?Outpatient Providers:Dr. Pervis Hocking ?History of Psych Hospitalization: Yes  ?Psychological Testing:  N/A   ? ?Abuse History:  ?Victim of: No.,  N/A    ?Report needed: No. ?Victim of Neglect:No. ?Perpetrator of  N/A   ?Witness / Exposure to Domestic  Violence: No   ?Protective Services Involvement: No  ?Witness to [employer] Violence:  No  ? ?Family History:  ?Family History  ?Problem Relation Age of Onset  ? Coronary artery disease Father 20  ? Bladder Cancer Father   ? Sickle cell anemia Mother   ? Asthma Mother   ?     smoker  ? Prostate cancer Paternal Grandfather 81  ?     deceased from prostate CA  ? Lung cancer Paternal Grandmother 42  ?     smoker  ? Breast cancer Paternal Uncle   ? Stroke Neg Hx   ? Diabetes Neg Hx   ? Colon cancer Neg Hx   ? Colon polyps Neg Hx   ? Esophageal cancer Neg Hx   ? Rectal cancer Neg Hx   ? Stomach cancer Neg Hx   ? ? ?Living situation: the patient lives with their spouse ? ?Sexual Orientation: Straight ? ?Relationship Status: married  ?Name of spouse / other:[name] ?If a parent, number of children / ages:2 boys, 43 & 63 ? ?Support Systems: family ? ?Financial Stress:  No  ? ?Income/Employment/Disability: Employment ? ?Military Service:  unknown ? ?Educational History: ?Education: post Forensic psychologist work or degree ? ?Religion/Sprituality/World View: ?unknown ? ?Any cultural differences that may affect / interfere with treatment:  not applicable  ? ?Recreation/Hobbies: woodworking ? ?Stressors: Occupational concerns   ? ?Strengths: Family ? ?Barriers:  unknown  ? ?Legal History: ?Pending legal issue / charges: The patient has no significant history  of legal issues. ?History of legal issue / charges:  N/A ? ?Medical History/Surgical History: not reviewed ?Past Medical History:  ?Diagnosis Date  ? Arthritis   ? Asthma   ? as a child  ? Bipolar disorder, unspecified ([place])   ? hospitalization 2004 for depression, bipolar, ambien overdose  ? Cancer ([place]) 10/20/2015  ? skin ca on nose removed  ? Cholesteatoma 05/2010  ? s/p R ear surgery, residual tinnitus, planning on L ear surgery  ? Depression   ? bipolar  ? Family history of ischemic heart disease   ? History of asthma   ? Migraine without aura, without mention of intractable  migraine without mention of status migrainosus   ? Rosacea 2017  ? Spinal stenosis   ? Unspecified adjustment reaction   ? ? ?Past Surgical History:  ?Procedure Laterality Date  ? CARPOMETACARPEL SUSPENSION PLASTY Left 10/18/2017  ? Procedure: LEFT THUMB TRAPEZIECTOMY AND SUSPENSIONPLASTY;  Surgeon: Leanora Cover, MD;  Location: [place];  Service: Orthopedics;  Laterality: Left;  ? CARPOMETACARPEL SUSPENSION PLASTY Right 03/28/2018  ? Procedure: CARPOMETACARPEL ([place]) SUSPENSION PLASTY AND TRAPEZIECTOMY;  Surgeon: Leanora Cover, MD;  Location: [place];  Service: Orthopedics;  Laterality: Right;  ? COLONOSCOPY  1999  ? normal (in [place])  ? ESOPHAGUS SURGERY  2003  ? torn esophagus  ? [place], 2012, 2013  ? for cholesteatoma, bilateral first L then R  ? NECK SURGERY  2002  ? spinal stenosis and arthritis  ? perianal abscess  1998  ? ? ?Medications: ?Current Outpatient Medications  ?Medication Sig Dispense Refill  ? ARIPiprazole (ABILIFY) 2 MG tablet Take 2 mg by mouth at bedtime.    ? doxycycline (VIBRA-TABS) 100 MG tablet Take 100 mg by mouth 2 (two) times daily. As needed    ? fluticasone (FLONASE) 50 MCG/ACT nasal spray Place 2 sprays into both nostrils daily. 16 g 0  ? ibuprofen (ADVIL) 200 MG tablet Take 3 tablets (600 mg total) by mouth 2 (two) times daily as needed for moderate pain.    ? ketoconazole (NIZORAL) 2 % cream Apply topically 2 (two) times daily. As needed    ? levocetirizine (XYZAL) 5 MG tablet Take 1 tablet (5 mg total) by mouth every evening. 30 tablet 0  ? lithium carbonate (LITHOBID) 300 MG CR tablet Take 2 tablets (600 mg total) by mouth 2 (two) times daily.    ? metroNIDAZOLE (METROGEL) 0.75 % gel SMARTSIG:1 Sparingly Topical Every Night    ? SULFACLEANSE 8/4 8-4 % SUSP Apply 1 application topically 2 (two) times daily. As needed    ? triamcinolone ointment (KENALOG) 0.1 % Apply topically 2 (two) times daily. As needed    ?  valACYclovir (VALTREX) 500 MG tablet TAKE 1 TABLET BY MOUTH TWICE DAILY. TAKE 3 DAYS AT A TIME AS NEEDED 30 tablet 1  ? ?No current facility-administered medications for this visit.  ? ? ?Allergies  ?Allergen Reactions  ? Morphine And Related Itching  ? ?Goals: Patient is seeking counseling for mood stabilization. Will utilize cognitive strategies to help manage feelings of anxiety and despair. Target date is 12-23. Monitor medication compliance -Ongoing. ? ?Session Notes: ?Erik Smith says he and his wife got into a "stupid fight", which was upsetting. He feels it is the overall stress related to his job and life circumstances. He says that they have some financial stress even though they have strong income together. He said the strain is that his  wife spends a lot of her income on her (adult) children. He says his wife gets into a panic over money, but it does not stress him out. ?States that his mother is improving. The cats are now more managed and she has some home health care daily. He will visit her at end of June.         ? ? ? ?          ?Diagnoses:  ?Bi Polar Disorder ? ?Plan of Care: Outpatient Therapy and psychotropic medication ? ? ?Marcelina Morel, PhD Time:4:05-5:00p 55 min. ? ? ? ? ? ? ? ? ? ? ? ? ? ? ? ?

## 2021-10-15 ENCOUNTER — Other Ambulatory Visit: Payer: Self-pay | Admitting: Family Medicine

## 2021-10-15 DIAGNOSIS — Z1159 Encounter for screening for other viral diseases: Secondary | ICD-10-CM

## 2021-10-15 DIAGNOSIS — Z125 Encounter for screening for malignant neoplasm of prostate: Secondary | ICD-10-CM

## 2021-10-15 DIAGNOSIS — F3176 Bipolar disorder, in full remission, most recent episode depressed: Secondary | ICD-10-CM

## 2021-10-15 DIAGNOSIS — E785 Hyperlipidemia, unspecified: Secondary | ICD-10-CM

## 2021-10-17 ENCOUNTER — Ambulatory Visit: Payer: BC Managed Care – PPO | Admitting: Psychology

## 2021-10-20 ENCOUNTER — Other Ambulatory Visit (INDEPENDENT_AMBULATORY_CARE_PROVIDER_SITE_OTHER): Payer: BC Managed Care – PPO

## 2021-10-20 DIAGNOSIS — Z1159 Encounter for screening for other viral diseases: Secondary | ICD-10-CM

## 2021-10-20 DIAGNOSIS — E785 Hyperlipidemia, unspecified: Secondary | ICD-10-CM | POA: Diagnosis not present

## 2021-10-20 DIAGNOSIS — Z125 Encounter for screening for malignant neoplasm of prostate: Secondary | ICD-10-CM | POA: Diagnosis not present

## 2021-10-20 DIAGNOSIS — F3176 Bipolar disorder, in full remission, most recent episode depressed: Secondary | ICD-10-CM | POA: Diagnosis not present

## 2021-10-20 LAB — COMPREHENSIVE METABOLIC PANEL
ALT: 21 U/L (ref 0–53)
AST: 16 U/L (ref 0–37)
Albumin: 4.6 g/dL (ref 3.5–5.2)
Alkaline Phosphatase: 61 U/L (ref 39–117)
BUN: 15 mg/dL (ref 6–23)
CO2: 29 mEq/L (ref 19–32)
Calcium: 9.6 mg/dL (ref 8.4–10.5)
Chloride: 104 mEq/L (ref 96–112)
Creatinine, Ser: 0.92 mg/dL (ref 0.40–1.50)
GFR: 92.43 mL/min (ref >60.00)
Glucose, Bld: 93 mg/dL (ref 70–99)
Potassium: 4.8 mEq/L (ref 3.5–5.1)
Sodium: 141 mEq/L (ref 135–145)
Total Bilirubin: 0.6 mg/dL (ref 0.2–1.2)
Total Protein: 7 g/dL (ref 6.0–8.3)

## 2021-10-20 LAB — LIPID PANEL
Cholesterol: 209 mg/dL — ABNORMAL HIGH (ref 0–200)
HDL: 71.9 mg/dL (ref >39.00)
LDL Cholesterol: 119 mg/dL — ABNORMAL HIGH (ref 0–99)
NonHDL: 136.87
Total CHOL/HDL Ratio: 3
Triglycerides: 91 mg/dL (ref 0.0–149.0)
VLDL: 18.2 mg/dL (ref 0.0–40.0)

## 2021-10-20 LAB — CBC WITH DIFFERENTIAL/PLATELET
Basophils Absolute: 0 10*3/uL (ref 0.0–0.1)
Basophils Relative: 0.7 % (ref 0.0–3.0)
Eosinophils Absolute: 0.2 10*3/uL (ref 0.0–0.7)
Eosinophils Relative: 3.5 % (ref 0.0–5.0)
HCT: 44.1 % (ref 39.0–52.0)
Hemoglobin: 15 g/dL (ref 13.0–17.0)
Lymphocytes Relative: 29.3 % (ref 12.0–46.0)
Lymphs Abs: 1.6 10*3/uL (ref 0.7–4.0)
MCHC: 34.1 g/dL (ref 30.0–36.0)
MCV: 87.1 fl (ref 78.0–100.0)
Monocytes Absolute: 0.5 10*3/uL (ref 0.1–1.0)
Monocytes Relative: 9.6 % (ref 3.0–12.0)
Neutro Abs: 3.1 10*3/uL (ref 1.4–7.7)
Neutrophils Relative %: 56.9 % (ref 43.0–77.0)
Platelets: 211 10*3/uL (ref 150.0–400.0)
RBC: 5.06 Mil/uL (ref 4.22–5.81)
RDW: 14 % (ref 11.5–15.5)
WBC: 5.5 10*3/uL (ref 4.0–10.5)

## 2021-10-20 LAB — PSA: PSA: 0.25 ng/mL (ref 0.10–4.00)

## 2021-10-20 LAB — TSH: TSH: 1.84 u[IU]/mL (ref 0.35–5.50)

## 2021-10-21 ENCOUNTER — Other Ambulatory Visit: Payer: BC Managed Care – PPO

## 2021-10-21 LAB — HEPATITIS C ANTIBODY
Hepatitis C Ab: NONREACTIVE
SIGNAL TO CUT-OFF: 0.05 (ref <1.00)

## 2021-10-21 LAB — LITHIUM LEVEL: Lithium Lvl: 0.3 mmol/L — ABNORMAL LOW (ref 0.6–1.2)

## 2021-10-24 ENCOUNTER — Encounter: Payer: Self-pay | Admitting: Family Medicine

## 2021-10-28 ENCOUNTER — Ambulatory Visit (INDEPENDENT_AMBULATORY_CARE_PROVIDER_SITE_OTHER): Payer: BC Managed Care – PPO | Admitting: Family Medicine

## 2021-10-28 ENCOUNTER — Encounter: Payer: Self-pay | Admitting: Family Medicine

## 2021-10-28 VITALS — BP 124/76 | HR 83 | Temp 97.3°F | Ht 69.5 in | Wt 229.1 lb

## 2021-10-28 DIAGNOSIS — Z8249 Family history of ischemic heart disease and other diseases of the circulatory system: Secondary | ICD-10-CM | POA: Diagnosis not present

## 2021-10-28 DIAGNOSIS — Z23 Encounter for immunization: Secondary | ICD-10-CM

## 2021-10-28 DIAGNOSIS — Z1211 Encounter for screening for malignant neoplasm of colon: Secondary | ICD-10-CM

## 2021-10-28 DIAGNOSIS — E669 Obesity, unspecified: Secondary | ICD-10-CM

## 2021-10-28 DIAGNOSIS — E785 Hyperlipidemia, unspecified: Secondary | ICD-10-CM | POA: Diagnosis not present

## 2021-10-28 DIAGNOSIS — Z0000 Encounter for general adult medical examination without abnormal findings: Secondary | ICD-10-CM

## 2021-10-28 DIAGNOSIS — L719 Rosacea, unspecified: Secondary | ICD-10-CM

## 2021-10-28 DIAGNOSIS — E66811 Obesity, class 1: Secondary | ICD-10-CM

## 2021-10-28 DIAGNOSIS — F319 Bipolar disorder, unspecified: Secondary | ICD-10-CM

## 2021-10-28 NOTE — Assessment & Plan Note (Addendum)
Update Lipoprotein a levels next labs

## 2021-10-28 NOTE — Telephone Encounter (Signed)
Will discuss at OV today.

## 2021-10-31 ENCOUNTER — Ambulatory Visit (INDEPENDENT_AMBULATORY_CARE_PROVIDER_SITE_OTHER): Payer: BC Managed Care – PPO | Admitting: Psychology

## 2021-10-31 DIAGNOSIS — F319 Bipolar disorder, unspecified: Secondary | ICD-10-CM

## 2021-11-14 ENCOUNTER — Ambulatory Visit: Payer: BC Managed Care – PPO | Admitting: Psychology

## 2021-11-15 ENCOUNTER — Ambulatory Visit (INDEPENDENT_AMBULATORY_CARE_PROVIDER_SITE_OTHER): Payer: BC Managed Care – PPO | Admitting: Psychology

## 2021-11-15 DIAGNOSIS — F319 Bipolar disorder, unspecified: Secondary | ICD-10-CM

## 2021-11-15 NOTE — Progress Notes (Signed)
[place] Counselor Initial Adult Exam  Name: Erik Smith Date: 11/15/2021 MRN: [id number] DOB: 11/20/1965 PCP: Ria Bush, MD     [name] participated from home, via video, and consented to treatment. Therapist participated from home office.   Guardian/Payee:  N/A    Paperwork requested: No   Reason for Visit /Presenting Problem: Improve/manage feeling of agitation  Mental Status Exam: Appearance:   Fairly Groomed     Behavior:  Appropriate  Motor:  Normal  Speech/Language:   Normal Rate  Affect:  Appropriate  Mood:  normal  Thought process:  normal  Thought content:    WNL  Sensory/Perceptual disturbances:    WNL  Orientation:  oriented to person, place, and situation  Attention:  Good  Concentration:  Good  Memory:  WNL  Fund of knowledge:   Good  Insight:    Good  Judgment:   Good  Impulse Control:  Good     Reported Symptoms:  Agitation, frustration, worry  Risk Assessment: Danger to Self:  No Self-injurious Behavior: No Danger to Others: No Duty to Warn:no Physical Aggression / Violence:No  Access to Firearms a concern:  unknown Gang Involvement:No  Patient / guardian was educated about steps to take if suicide or homicide risk level increases between visits: n/a While future psychiatric events cannot be accurately predicted, the patient does not currently require acute inpatient psychiatric care and does not currently meet [facility] involuntary commitment criteria.  Substance Abuse History: Current substance abuse: No     Past Psychiatric History:   Previous psychological history is significant for Bipolar Mania Outpatient Providers:Dr. Pervis Hocking History of Psych Hospitalization: Yes  Psychological Testing:  N/A    Abuse History:  Victim of: No.,  N/A    Report needed: No. Victim of Neglect:No. Perpetrator of  N/A   Witness / Exposure to Domestic Violence: No   Protective  Services Involvement: No  Witness to [employer] Violence:  No   Family History:  Family History  Problem Relation Age of Onset   Coronary artery disease Father 83   Bladder Cancer Father    Sickle cell anemia Mother    Asthma Mother        smoker   Prostate cancer Paternal Grandfather 69       deceased from prostate CA   Lung cancer Paternal Grandmother 1       smoker   Breast cancer Paternal Uncle    Stroke Neg Hx    Diabetes Neg Hx    Colon cancer Neg Hx    Colon polyps Neg Hx    Esophageal cancer Neg Hx    Rectal cancer Neg Hx    Stomach cancer Neg Hx     Living situation: the patient lives with their spouse  Sexual Orientation: Straight  Relationship Status: married  Name of spouse / other:[name] If a parent, number of children / ages:2 boys, 51 & 61  Support Systems: family  Financial Stress:  No   Income/Employment/Disability: Employment  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 History of legal issue / charges:  N/A  Medical History/Surgical History: not reviewed Past Medical History:  Diagnosis Date   Arthritis    Asthma    as a child   Bipolar disorder, unspecified ([place])    hospitalization 2004 for depression, bipolar, ambien overdose   Cancer ([place]) 10/20/2015   skin ca on nose removed   Cholesteatoma 05/2010   s/p R ear surgery, residual tinnitus, planning on L ear surgery   Depression    bipolar   Family history of ischemic heart disease    History of asthma    Migraine without aura, without mention of intractable migraine without mention of  status migrainosus    Rosacea 2017   Spinal stenosis    Unspecified adjustment reaction     Past Surgical History:  Procedure Laterality Date   CARPOMETACARPEL SUSPENSION PLASTY Left 10/18/2017   Procedure: LEFT THUMB TRAPEZIECTOMY AND SUSPENSIONPLASTY;  Surgeon: Leanora Cover, MD;  Location: [place];  Service: Orthopedics;  Laterality: Left;   CARPOMETACARPEL SUSPENSION PLASTY Right 03/28/2018   Procedure: CARPOMETACARPEL [facility]) SUSPENSION PLASTY AND TRAPEZIECTOMY;  Surgeon: Leanora Cover, MD;  Location: [place];  Service: Orthopedics;  Laterality: Right;   COLONOSCOPY  1999   normal (in [place])   ESOPHAGUS SURGERY  2003   torn esophagus   [place], 2012, 2013   for cholesteatoma, bilateral first L then R   NECK SURGERY  2002   spinal stenosis and arthritis   perianal abscess  1998    Medications: Current Outpatient Medications  Medication Sig Dispense Refill   ARIPiprazole (ABILIFY) 2 MG tablet Take 2 mg by mouth at bedtime.     doxycycline (VIBRA-TABS) 100 MG tablet Take 100 mg by mouth 2 (two) times daily. As needed     fluticasone (FLONASE) 50 MCG/ACT nasal spray Place 2 sprays into both nostrils daily. 16 g 0   ibuprofen (ADVIL) 200 MG tablet Take 3 tablets (600 mg total) by mouth 2 (two) times daily as needed for moderate pain.     ketoconazole (NIZORAL) 2 % cream Apply topically 2 (two) times daily. As needed     levocetirizine (XYZAL) 5 MG tablet Take 1 tablet (5 mg total) by mouth every evening. 30 tablet 0   lithium carbonate (LITHOBID) 300 MG CR tablet Take 2 tablets (600 mg total) by mouth 2 (two) times daily.     metroNIDAZOLE (METROGEL) 0.75 % gel SMARTSIG:1 Sparingly Topical Every Night     SULFACLEANSE 8/4 8-4 % SUSP Apply 1 application topically 2 (two) times daily. As needed     triamcinolone ointment (KENALOG) 0.1 % Apply topically 2 (two) times daily. As needed     valACYclovir (VALTREX) 500 MG tablet  TAKE 1 TABLET BY MOUTH TWICE DAILY. TAKE 3 DAYS AT A TIME AS NEEDED 30 tablet 1   No current facility-administered medications for this visit.    Allergies  Allergen Reactions   Morphine And Related Itching          Risk Evaluation and Mitigation Strategies (REMS)  The medication you are ordering is associated with Risk Evaluation and Mitigation Strategies (REMS) and has Elements to [employer] Use (ETASU).   Please use the link below to the FDA website to assure all ETASU are addressed for this medication.  [email or web address]   Risk Evaluation and Mitigation Strategies (REMS)  The medication you are ordering is associated with Risk Evaluation and Mitigation Strategies (REMS) and  has Elements to [employer] (ETASU).   Please use the link below to the FDA website to assure all ETASU are addressed for this medication.  [email or web address] and normal is between      No data to display          Risk Evaluation and Mitigation Strategies (REMS)  The medication you are ordering is associated with Risk Evaluation and Mitigation Strategies (REMS) and has Elements to [employer] Use (ETASU).   Please use the link below to the FDA website to assure all ETASU are addressed for this medication.  [email or web address] and normal is      No data to display            Goals: Patient is seeking counseling for mood stabilization. Will utilize cognitive strategies to help manage feelings of anxiety and despair. Target date is 12-23. Monitor medication compliance -Ongoing. Session was virtual (video) at patient request. He is at home and provider in home office.  Session Notes: Hezekiah and his wife just got back from vacation and had many activities planned, ended in [place]. Says he and wife walked 35 miles during the week. Had a great time. He has an interview for  a job in [place] and is excited at the opportunity. He has 30 applications in to schools. States he feels good that if he doesn't get job, he has a position that he enjoys and will feel good staying there. He has high hopes to go into a school that needs extra help to improve the scores of the students.  He is taking son fishing at [employer] and camping next Tuesday and Wednesday. Moods are all stable. He is starting a new diet that is sent via mail Linus Salmons).                          Diagnoses:  Bi Polar Disorder  Plan of Care: Outpatient Therapy and psychotropic medication   Marcelina Morel, PhD Time:1:10-2:00p 50 min.

## 2021-11-28 ENCOUNTER — Ambulatory Visit (INDEPENDENT_AMBULATORY_CARE_PROVIDER_SITE_OTHER): Payer: BC Managed Care – PPO | Admitting: Psychology

## 2021-11-28 DIAGNOSIS — F319 Bipolar disorder, unspecified: Secondary | ICD-10-CM

## 2021-11-28 NOTE — Progress Notes (Signed)
[place] Counselor Initial Adult Exam  Name: Erik Smith Date: 11/28/2021 MRN: [id number] DOB: 25-Oct-1965 PCP: Ria Bush, MD     [name] participated from home, via video, and consented to treatment. Therapist participated from home office.   Guardian/Payee:  N/A    Paperwork requested: No   Reason for Visit /Presenting Problem: Improve/manage feeling of agitation  Mental Status Exam: Appearance:   Fairly Groomed     Behavior:  Appropriate  Motor:  Normal  Speech/Language:   Normal Rate  Affect:  Appropriate  Mood:  normal  Thought process:  normal  Thought content:    WNL  Sensory/Perceptual disturbances:    WNL  Orientation:  oriented to person, place, and situation  Attention:  Good  Concentration:  Good  Memory:  WNL  Fund of knowledge:   Good  Insight:    Good  Judgment:   Good  Impulse Control:  Good     Reported Symptoms:  Agitation, frustration, worry  Risk Assessment: Danger to Self:  No Self-injurious Behavior: No Danger to Others: No Duty to Warn:no Physical Aggression / Violence:No  Access to Firearms a concern:  unknown Gang Involvement:No  Patient / guardian was educated about steps to take if suicide or homicide risk level increases between visits: n/a While future psychiatric events cannot be accurately predicted, the patient does not currently require acute inpatient psychiatric care and does not currently meet [facility] involuntary commitment criteria.  Substance Abuse History: Current substance abuse: No     Past Psychiatric History:   Previous psychological history is significant for Bipolar Mania Outpatient Providers:Dr. Pervis Hocking History of Psych Hospitalization: Yes  Psychological Testing:  N/A    Abuse History:  Victim of: No.,  N/A    Report needed: No. Victim of Neglect:No. Perpetrator of  N/A   Witness / Exposure to  Domestic Violence: No   Protective Services Involvement: No  Witness to [employer] Violence:  No   Family History:  Family History  Problem Relation Age of Onset   Coronary artery disease Father 60   Bladder Cancer Father    Sickle cell anemia Mother    Asthma Mother        smoker   Prostate cancer Paternal Grandfather 47       deceased from prostate CA   Lung cancer Paternal Grandmother 5       smoker   Breast cancer Paternal Uncle    Stroke Neg Hx    Diabetes Neg Hx    Colon cancer Neg Hx    Colon polyps Neg Hx    Esophageal cancer Neg Hx    Rectal cancer Neg Hx    Stomach cancer Neg Hx     Living situation: the patient lives with their spouse  Sexual Orientation: Straight  Relationship Status: married  Name of spouse / other:[name] If a parent, number of children / ages:2 boys, 78 & 41  Support Systems: family  Financial Stress:  No   Income/Employment/Disability: Employment  Armed forces logistics/support/administrative officer:  unknown  Educational History: Education: post Forensic psychologist work or degree  Religion/Sprituality/World View: unknown  Any cultural differences that may affect / interfere with treatment:  not applicable   Recreation/Hobbies: woodworking  Stressors: Occupational concerns    Strengths: Family  Barriers:  unknown  Legal History: Pending legal issue / charges: The patient has no significant history of legal issues. History of legal issue / charges:  N/A  Medical History/Surgical History: not reviewed Past Medical History:  Diagnosis Date   Arthritis    Asthma    as a child   Bipolar disorder, unspecified ([place])    hospitalization 2004 for depression, bipolar, ambien overdose   Cancer ([place]) 10/20/2015   skin ca on nose removed   Cholesteatoma 05/2010   s/p R ear surgery, residual tinnitus, planning on L ear surgery   Depression    bipolar   Family history of ischemic heart disease    History of asthma    Migraine without aura, without mention of  intractable migraine without mention of status migrainosus    Rosacea 2017   Spinal stenosis    Unspecified adjustment reaction     Past Surgical History:  Procedure Laterality Date   CARPOMETACARPEL SUSPENSION PLASTY Left 10/18/2017   Procedure: LEFT THUMB TRAPEZIECTOMY AND SUSPENSIONPLASTY;  Surgeon: Leanora Cover, MD;  Location: [place];  Service: Orthopedics;  Laterality: Left;   CARPOMETACARPEL SUSPENSION PLASTY Right 03/28/2018   Procedure: CARPOMETACARPEL [facility]) SUSPENSION PLASTY AND TRAPEZIECTOMY;  Surgeon: Leanora Cover, MD;  Location: [place];  Service: Orthopedics;  Laterality: Right;   COLONOSCOPY  1999   normal (in [place])   ESOPHAGUS SURGERY  2003   torn esophagus   [place], 2012, 2013   for cholesteatoma, bilateral first L then R   NECK SURGERY  2002   spinal stenosis and arthritis   perianal abscess  1998    Medications: Current Outpatient Medications  Medication Sig Dispense Refill   ARIPiprazole (ABILIFY) 2 MG tablet Take 2 mg by mouth at bedtime.     doxycycline (VIBRA-TABS) 100 MG tablet Take 100 mg by mouth 2 (two) times daily. As needed     fluticasone (FLONASE) 50 MCG/ACT nasal spray Place 2 sprays into both nostrils daily. 16 g 0   ibuprofen (ADVIL) 200 MG tablet Take 3 tablets (600 mg total) by mouth 2 (two) times daily as needed for moderate pain.     ketoconazole (NIZORAL) 2 % cream Apply topically 2 (two) times daily. As needed     levocetirizine (XYZAL) 5 MG tablet Take 1 tablet (5 mg total) by mouth every evening. 30 tablet 0   lithium carbonate (LITHOBID) 300 MG CR tablet Take 2 tablets (600 mg total) by mouth 2 (two) times daily.     metroNIDAZOLE (METROGEL) 0.75 % gel SMARTSIG:1 Sparingly Topical Every Night     SULFACLEANSE 8/4 8-4 % SUSP Apply 1 application topically 2 (two) times daily. As needed     triamcinolone ointment (KENALOG) 0.1 % Apply topically 2 (two) times daily. As needed      valACYclovir (VALTREX) 500 MG tablet TAKE 1 TABLET BY MOUTH TWICE DAILY. TAKE 3 DAYS AT A TIME AS NEEDED 30 tablet 1   No current facility-administered medications for this visit.    Allergies  Allergen Reactions   Morphine And Related Itching          Risk Evaluation and Mitigation Strategies (REMS)  The medication you are ordering is associated with Risk Evaluation and Mitigation Strategies (REMS) and has Elements to [employer] Use (ETASU).   Please use the link below to the FDA website to assure all ETASU are addressed for this medication.  [email or web address]   Risk Evaluation and Mitigation Strategies (REMS)  The  medication you are ordering is associated with Risk Evaluation and Mitigation Strategies (REMS) and has Elements to [employer] (ETASU).   Please use the link below to the FDA website to assure all ETASU are addressed for this medication.  [email or web address] and normal is between      No data to display          Risk Evaluation and Mitigation Strategies (REMS)  The medication you are ordering is associated with Risk Evaluation and Mitigation Strategies (REMS) and has Elements to [employer] Use (ETASU).   Please use the link below to the FDA website to assure all ETASU are addressed for this medication.  [email or web address] and normal is      No data to display            Goals: Patient is seeking counseling for mood stabilization. Will utilize cognitive strategies to help manage feelings of anxiety and despair. Target date is 12-23. Monitor medication compliance -Ongoing. Session was virtual (video) at patient request. He is at home and provider in home office.  Session Notes: Shawndell had 3 interviews and 2 were "no". One of them is waiting to finish interviews and will let him know. He has another interview tomorrow. 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 Suggested he talk with her and we discussed a strategy in talking with her. He and wife have started [place]. All food is sent and they eat every 21/2 hours. He is not getting overly discouraged and will continue to apply and be optimistic.                            Diagnoses:  Bi Polar Disorder  Plan of Care: Outpatient Therapy and psychotropic medication   Marcelina Morel, PhD Time:4:10-5:00p 50 min.

## 2021-12-12 ENCOUNTER — Ambulatory Visit (INDEPENDENT_AMBULATORY_CARE_PROVIDER_SITE_OTHER): Payer: BC Managed Care – PPO | Admitting: Psychology

## 2021-12-12 DIAGNOSIS — F319 Bipolar disorder, unspecified: Secondary | ICD-10-CM

## 2021-12-12 NOTE — Progress Notes (Signed)
Erik Smith Counselor Initial Adult Exam  Name: Erik Smith Date: 12/12/2021 MRN: [id number] DOB: Sep 28, 1965 PCP: Erik Bush, MD     Erik Smith participated from home, via video, and consented to treatment. Therapist participated from home office.   Guardian/Payee:  N/A    Paperwork requested: No   Reason for Visit /Presenting Problem: Improve/manage feeling of agitation  Mental Status Exam: Appearance:   Fairly Groomed     Behavior:  Appropriate  Motor:  Normal  Speech/Language:   Normal Rate  Affect:  Appropriate  Mood:  normal  Thought process:  normal  Thought content:    WNL  Sensory/Perceptual disturbances:    WNL  Orientation:  oriented to person, place, and situation  Attention:  Good  Concentration:  Good  Memory:  WNL  Fund of knowledge:   Good  Insight:    Good  Judgment:   Good  Impulse Control:  Good     Reported Symptoms:  Agitation, frustration, worry  Risk Assessment: Danger to Self:  No Self-injurious Behavior: No Danger to Others: No Duty to Warn:no Physical Aggression / Violence:No  Access to Firearms a concern:  unknown Gang Involvement:No  Patient / guardian was educated about steps to take if suicide or homicide risk level increases between visits: n/a While future psychiatric events cannot be accurately predicted, the patient does not currently require acute inpatient psychiatric care and does not currently meet [facility] involuntary commitment criteria.  Substance Abuse History: Current substance abuse: No     Past Psychiatric History:   Previous psychological history is significant for Bipolar Mania Outpatient Providers:Dr. Pervis Hocking History of Psych Hospitalization: Yes  Psychological Testing:  N/A    Abuse History:  Victim of: No.,  N/A    Report needed: No. Victim of Neglect:No. Perpetrator of  N/A    Witness / Exposure to Domestic Violence: No   Protective Services Involvement: No  Witness to [employer] Violence:  No   Family History:  Family History  Problem Relation Age of Onset   Coronary artery disease Father 31   Bladder Cancer Father    Sickle cell anemia Mother    Asthma Mother        smoker   Prostate cancer Paternal Grandfather 64       deceased from prostate CA   Lung cancer Paternal Grandmother 20       smoker   Breast cancer Paternal Uncle    Stroke Neg Hx    Diabetes Neg Hx    Colon cancer Neg Hx    Colon polyps Neg Hx    Esophageal cancer Neg Hx    Rectal cancer Neg Hx    Stomach cancer Neg Hx     Living situation: the patient lives with their spouse  Sexual Orientation: Straight  Relationship Status: married  Name of spouse / other:Erik Smith If a parent, number of children / ages:2 boys, 62 & 109  Support Systems: family  Financial Stress:  No   Income/Employment/Disability: Employment  Armed forces logistics/support/administrative officer:  unknown  Educational History: Education: post Forensic psychologist work or degree  Religion/Sprituality/World View: unknown  Any cultural differences that may affect / interfere with treatment:  not applicable   Recreation/Hobbies:  woodworking  Stressors: Occupational concerns    Strengths: Family  Barriers:  unknown   Legal History: Pending legal issue / charges: The patient has no significant history of legal issues. History of legal issue / charges:  N/A  Medical History/Surgical History: not reviewed Past Medical History:  Diagnosis Date   Arthritis    Asthma    as a child   Bipolar disorder, unspecified ([place])    hospitalization 2004 for depression, bipolar, ambien overdose   Cancer ([place]) 10/20/2015   skin ca on nose removed   Cholesteatoma 05/2010   s/p R ear surgery, residual tinnitus, planning on L ear surgery   Depression    bipolar   Family history of ischemic heart disease    History of asthma    Migraine without  aura, without mention of intractable migraine without mention of status migrainosus    Rosacea 2017   Spinal stenosis    Unspecified adjustment reaction     Past Surgical History:  Procedure Laterality Date   CARPOMETACARPEL SUSPENSION PLASTY Left 10/18/2017   Procedure: LEFT THUMB TRAPEZIECTOMY AND SUSPENSIONPLASTY;  Surgeon: Leanora Cover, MD;  Location: [place];  Service: Orthopedics;  Laterality: Left;   CARPOMETACARPEL SUSPENSION PLASTY Right 03/28/2018   Procedure: CARPOMETACARPEL [facility]) SUSPENSION PLASTY AND TRAPEZIECTOMY;  Surgeon: Leanora Cover, MD;  Location: [place];  Service: Orthopedics;  Laterality: Right;   COLONOSCOPY  1999   normal (in [place])   ESOPHAGUS SURGERY  2003   torn esophagus   [place], 2012, 2013   for cholesteatoma, bilateral first L then R   NECK SURGERY  2002   spinal stenosis and arthritis   perianal abscess  1998    Medications: Current Outpatient Medications  Medication Sig Dispense Refill   ARIPiprazole (ABILIFY) 2 MG tablet Take 2 mg by mouth at bedtime.     doxycycline (VIBRA-TABS) 100 MG tablet Take 100 mg by mouth 2 (two) times daily. As needed     fluticasone (FLONASE) 50 MCG/ACT nasal spray Place 2 sprays into both nostrils daily. 16 g 0   ibuprofen (ADVIL) 200 MG tablet Take 3 tablets (600 mg total) by mouth 2 (two) times daily as needed for moderate pain.     ketoconazole (NIZORAL) 2 % cream Apply topically 2 (two) times daily. As needed     levocetirizine (XYZAL) 5 MG tablet Take 1 tablet (5 mg total) by mouth every evening. 30 tablet 0   lithium carbonate (LITHOBID) 300 MG CR tablet Take 2 tablets (600 mg total) by mouth 2 (two) times daily.     metroNIDAZOLE (METROGEL) 0.75 % gel SMARTSIG:1 Sparingly Topical Every Night     SULFACLEANSE 8/4 8-4 % SUSP Apply 1 application topically 2 (two) times daily. As needed     triamcinolone ointment (KENALOG) 0.1 % Apply topically 2 (two)  times daily. As needed     valACYclovir (VALTREX) 500 MG tablet TAKE 1 TABLET BY MOUTH TWICE DAILY. TAKE 3 DAYS AT A TIME AS NEEDED 30 tablet 1   No current facility-administered medications for this visit.    Allergies  Allergen Reactions   Morphine And Related Itching          Risk Evaluation and Mitigation Strategies (REMS)  The medication you are ordering is associated with Risk Evaluation and Mitigation Strategies (REMS) and has Elements to [employer] Use (ETASU).   Please use the link below to the FDA website to assure all ETASU are  addressed for this medication.  [email or web address]   Risk Evaluation and Mitigation Strategies (REMS)  The medication you are ordering is associated with Risk Evaluation and Mitigation Strategies (REMS) and has Elements to [employer] Use (ETASU).   Please use the link below to the FDA website to assure all ETASU are addressed for this medication.  [email or web address] and normal is between      No data to display          Risk Evaluation and Mitigation Strategies (REMS)  The medication you are ordering is associated with Risk Evaluation and Mitigation Strategies (REMS) and has Elements to [employer] Use (ETASU).   Please use the link below to the FDA website to assure all ETASU are addressed for this medication.  [email or web address] and normal is      No data to display            Goals: Patient is seeking counseling for mood stabilization. Will utilize cognitive strategies to help manage feelings of anxiety and despair. Target date is 12-23. Monitor medication compliance -Ongoing. Session was virtual (video) at patient request. He is at home and provider in home office.  Session Notes: Akash is down 12 pounds in 3 weeks using this diet. We talked about long term health goals and the need for better self  (physical) care. On Friday, his wife's friend who lives next door to the Superintendent of [facility], spoke with her. She suggested that Quillian Quince e-mail her his resume. He is now waiting to hear her response. He had and interview in [place] and "it went well", but heard back that they selected someone else. Says they were very positive about their impression of him. He is satisfied with his current job, so he does not feel extra pressure about getting a new position. He is preparing to move forward with his current job. He says he now realizes that being angry or upset "does not get me anywhere".  He and his wife decided [place] He and his wife are looking forward to having this as a getaway on weekends.                                 Diagnoses:  Bi Polar Disorder  Plan of Care: Outpatient Therapy and psychotropic medication   Marcelina Morel, PhD Time:4:10-5:00p 50 min.

## 2021-12-26 ENCOUNTER — Ambulatory Visit (INDEPENDENT_AMBULATORY_CARE_PROVIDER_SITE_OTHER): Payer: BC Managed Care – PPO | Admitting: Psychology

## 2021-12-26 DIAGNOSIS — F319 Bipolar disorder, unspecified: Secondary | ICD-10-CM

## 2021-12-26 NOTE — Progress Notes (Signed)
[facility] Counselor Initial Adult Exam  Name: Erik Smith Date: 12/26/2021 MRN: [id number] DOB: 19-Jan-1966 PCP: Ria Bush, MD     Erik Smith participated from home, via video, and consented to treatment. Therapist participated from home office.   Guardian/Payee:  N/A    Paperwork requested: No   Reason for Visit /Presenting Problem: Improve/manage feeling of agitation  Mental Status Exam: Appearance:   Fairly Groomed     Behavior:  Appropriate  Motor:  Normal  Speech/Language:   Normal Rate  Affect:  Appropriate  Mood:  normal  Thought process:  normal  Thought content:    WNL  Sensory/Perceptual disturbances:    WNL  Orientation:  oriented to person, place, and situation  Attention:  Good  Concentration:  Good  Memory:  WNL  Fund of knowledge:   Good  Insight:    Good  Judgment:   Good  Impulse Control:  Good     Reported Symptoms:  Agitation, frustration, worry  Risk Assessment: Danger to Self:  No Self-injurious Behavior: No Danger to Others: No Duty to Warn:no Physical Aggression / Violence:No  Access to Firearms a concern:  unknown Gang Involvement:No  Patient / guardian was educated about steps to take if suicide or homicide risk level increases between visits: n/a While future psychiatric events cannot be accurately predicted, the patient does not currently require acute inpatient psychiatric care and does not currently meet [facility] involuntary commitment criteria.  Substance Abuse History: Current substance abuse: No     Past Psychiatric History:   Previous psychological history is significant for Bipolar Mania Outpatient Providers:Dr. Pervis Hocking History of Psych Hospitalization: Yes  Psychological Testing:  N/A    Abuse History:  Victim of: No.,  N/A    Report needed: No. Victim of  Neglect:No. Perpetrator of  N/A   Witness / Exposure to Domestic Violence: No   Protective Services Involvement: No  Witness to [employer] Violence:  No   Family History:  Family History  Problem Relation Age of Onset   Coronary artery disease Father 42   Bladder Cancer Father    Sickle cell anemia Mother    Asthma Mother        smoker   Prostate cancer Paternal Grandfather 71       deceased from prostate CA   Lung cancer Paternal Grandmother 66       smoker   Breast cancer Paternal Uncle    Stroke Neg Hx    Diabetes Neg Hx    Colon cancer Neg Hx    Colon polyps Neg Hx    Esophageal cancer Neg Hx    Rectal cancer Neg Hx    Stomach cancer Neg Hx     Living situation: the patient lives with their spouse  Sexual Orientation: Straight  Relationship Status: married  Name of spouse / other:[name] If a parent, number of children / ages:2 boys, 33 & 39  Support Systems: family  Financial Stress:  No   Income/Employment/Disability: Employment  Armed forces logistics/support/administrative officer:  unknown  Educational History: Education: post Forensic psychologist work or degree  Religion/Sprituality/World View: unknown  Any  cultural differences that may affect / interfere with treatment:  not applicable   Recreation/Hobbies: woodworking  Stressors: Occupational concerns    Strengths: Family  Barriers:  unknown   Legal History: Pending legal issue / charges: The patient has no significant history of legal issues. History of legal issue / charges:  N/A  Medical History/Surgical History: not reviewed Past Medical History:  Diagnosis Date   Arthritis    Asthma    as a child   Bipolar disorder, unspecified ([place])    hospitalization 2004 for depression, bipolar, ambien overdose   Cancer ([place]) 10/20/2015   skin ca on nose removed   Cholesteatoma 05/2010   s/p R ear surgery, residual tinnitus, planning on L ear surgery   Depression    bipolar   Family history of ischemic heart disease    History  of asthma    Migraine without aura, without mention of intractable migraine without mention of status migrainosus    Rosacea 2017   Spinal stenosis    Unspecified adjustment reaction     Past Surgical History:  Procedure Laterality Date   CARPOMETACARPEL SUSPENSION PLASTY Left 10/18/2017   Procedure: LEFT THUMB TRAPEZIECTOMY AND SUSPENSIONPLASTY;  Surgeon: Leanora Cover, MD;  Location: [facility];  Service: Orthopedics;  Laterality: Left;   CARPOMETACARPEL SUSPENSION PLASTY Right 03/28/2018   Procedure: CARPOMETACARPEL [facility]) SUSPENSION PLASTY AND TRAPEZIECTOMY;  Surgeon: Leanora Cover, MD;  Location: [place];  Service: Orthopedics;  Laterality: Right;   COLONOSCOPY  1999   normal (in [place])   ESOPHAGUS SURGERY  2003   torn esophagus   [place], 2012, 2013   for cholesteatoma, bilateral first L then R   NECK SURGERY  2002   spinal stenosis and arthritis   perianal abscess  1998    Medications: Current Outpatient Medications  Medication Sig Dispense Refill   ARIPiprazole (ABILIFY) 2 MG tablet Take 2 mg by mouth at bedtime.     doxycycline (VIBRA-TABS) 100 MG tablet Take 100 mg by mouth 2 (two) times daily. As needed     fluticasone (FLONASE) 50 MCG/ACT nasal spray Place 2 sprays into both nostrils daily. 16 g 0   ibuprofen (ADVIL) 200 MG tablet Take 3 tablets (600 mg total) by mouth 2 (two) times daily as needed for moderate pain.     ketoconazole (NIZORAL) 2 % cream Apply topically 2 (two) times daily. As needed     levocetirizine (XYZAL) 5 MG tablet Take 1 tablet (5 mg total) by mouth every evening. 30 tablet 0   lithium carbonate (LITHOBID) 300 MG CR tablet Take 2 tablets (600 mg total) by mouth 2 (two) times daily.     metroNIDAZOLE (METROGEL) 0.75 % gel SMARTSIG:1 Sparingly Topical Every Night     SULFACLEANSE 8/4 8-4 % SUSP Apply 1 application topically 2 (two) times daily. As needed     triamcinolone ointment (KENALOG)  0.1 % Apply topically 2 (two) times daily. As needed     valACYclovir (VALTREX) 500 MG tablet TAKE 1 TABLET BY MOUTH TWICE DAILY. TAKE 3 DAYS AT A TIME AS NEEDED 30 tablet 1   No current facility-administered medications for this visit.    Allergies  Allergen Reactions   Morphine And Related Itching          Risk Evaluation and Mitigation Strategies (REMS)  The medication you are ordering is associated with Risk Evaluation and Mitigation Strategies (REMS) and has Elements to [employer] Use (ETASU).  Please use the link below to the FDA website to assure all ETASU are addressed for this medication.  [email or web address]   Risk Evaluation and Mitigation Strategies (REMS)  The medication you are ordering is associated with Risk Evaluation and Mitigation Strategies (REMS) and has Elements to [employer] Use (ETASU).   Please use the link below to the FDA website to assure all ETASU are addressed for this medication.  [email or web address] and normal is between      No data to display          Risk Evaluation and Mitigation Strategies (REMS)  The medication you are ordering is associated with Risk Evaluation and Mitigation Strategies (REMS) and has Elements to [employer] Use (ETASU).   Please use the link below to the FDA website to assure all ETASU are addressed for this medication.  [email or web address] and normal is      No data to display            Goals: Patient is seeking counseling for mood stabilization. Will utilize cognitive strategies to help manage feelings of anxiety and despair. Target date is 12-23. Monitor medication compliance -Ongoing. Session was virtual (video) at patient request. He is at home and provider in home office.  Session Notes: Darrill says he started back to work today. He heard back from the superintendent assistant, who  said his info was passed on the HR. 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 He feels that is bad news. Encouraged him to reach out to the woman who is apparently giving him a poor recommendation.  He states that he and his wife bought a camper from his parents 3-4 years and then sold it before pandemic. As he discussed last week, [place] He is mostly pleased with the purchase. Feels this will be good for his mind/moods. His anxiety is in check.                                    Diagnoses:  Bi Polar Disorder  Plan of Care: Outpatient Therapy and psychotropic medication   Marcelina Morel, PhD Time:4:10-5:00p 50 min.

## 2022-01-23 ENCOUNTER — Ambulatory Visit (INDEPENDENT_AMBULATORY_CARE_PROVIDER_SITE_OTHER): Payer: BC Managed Care – PPO | Admitting: Psychology

## 2022-01-23 DIAGNOSIS — F319 Bipolar disorder, unspecified: Secondary | ICD-10-CM | POA: Diagnosis not present

## 2022-01-23 NOTE — Progress Notes (Signed)
[facility] Counselor Initial Adult Exam  Name: Erik Smith Date: 01/23/2022 MRN: [id number] DOB: 03/28/1966 PCP: Ria Bush, MD     [name] participated from home, via video, and consented to treatment. Therapist participated from home office.   Guardian/Payee:  N/A    Paperwork requested: No   Reason for Visit /Presenting Problem: Improve/manage feeling of agitation  Mental Status Exam: Appearance:   Fairly Groomed     Behavior:  Appropriate  Motor:  Normal  Speech/Language:   Normal Rate  Affect:  Appropriate  Mood:  normal  Thought process:  normal  Thought content:    WNL  Sensory/Perceptual disturbances:    WNL  Orientation:  oriented to person, place, and situation  Attention:  Good  Concentration:  Good  Memory:  WNL  Fund of knowledge:   Good  Insight:    Good  Judgment:   Good  Impulse Control:  Good     Reported Symptoms:  Agitation, frustration, worry  Risk Assessment: Danger to Self:  No Self-injurious Behavior: No Danger to Others: No Duty to Warn:no Physical Aggression / Violence:No  Access to Firearms a concern:  unknown Gang Involvement:No  Patient / guardian was educated about steps to take if suicide or homicide risk level increases between visits: n/a While future psychiatric events cannot be accurately predicted, the patient does not currently require acute inpatient psychiatric care and does not currently meet [facility] involuntary commitment criteria.  Substance Abuse History: Current substance abuse: No     Past Psychiatric History:   Previous psychological history is significant for Bipolar Mania Outpatient Providers:Dr. Pervis Hocking History of Psych Hospitalization: Yes  Psychological Testing:  N/A    Abuse History:  Victim of: No.,  N/A    Report needed: No. Victim of Neglect:No. Perpetrator of  N/A   Witness / Exposure to Domestic Violence: No   Protective Services Involvement: No  Witness to  [employer] Violence:  No   Family History:  Family History  Problem Relation Age of Onset   Coronary artery disease Father 11   Bladder Cancer Father    Sickle cell anemia Mother    Asthma Mother        smoker   Prostate cancer Paternal Grandfather 23       deceased from prostate CA   Lung cancer Paternal Grandmother 59       smoker   Breast cancer Paternal Uncle    Stroke Neg Hx    Diabetes Neg Hx    Colon cancer Neg Hx    Colon polyps Neg Hx    Esophageal cancer Neg Hx    Rectal cancer Neg Hx    Stomach cancer Neg Hx     Living situation: the patient lives with their spouse  Sexual Orientation: Straight  Relationship Status: married  Name of spouse / other:[name] If a parent, number of children / ages:2 boys, 61 & 80  Support Systems: family  Financial Stress:  No   Income/Employment/Disability: Employment  Armed forces logistics/support/administrative officer:  unknown  Educational History: Education: post Forensic psychologist work or degree  Religion/Sprituality/World View: unknown  Any cultural differences that may affect / interfere with treatment:  not applicable   Recreation/Hobbies: woodworking  Stressors: Occupational concerns    Strengths: Family  Barriers:  unknown   Legal History: Pending legal issue / charges: The patient has no significant history of legal issues. History of legal issue / charges:  N/A  Medical History/Surgical History: not reviewed Past Medical  History:  Diagnosis Date   Arthritis    Asthma    as a child   Bipolar disorder, unspecified ([place])    hospitalization 2004 for depression, bipolar, ambien overdose   Cancer ([place]) 10/20/2015   skin ca on nose removed   Cholesteatoma 05/2010   s/p R ear surgery, residual tinnitus, planning on L ear surgery   Depression    bipolar   Family history of ischemic heart disease    History of asthma    Migraine without aura, without mention of intractable migraine without mention of status migrainosus    Rosacea 2017    Spinal stenosis    Unspecified adjustment reaction     Past Surgical History:  Procedure Laterality Date   CARPOMETACARPEL SUSPENSION PLASTY Left 10/18/2017   Procedure: LEFT THUMB TRAPEZIECTOMY AND SUSPENSIONPLASTY;  Surgeon: Leanora Cover, MD;  Location: [place];  Service: Orthopedics;  Laterality: Left;   CARPOMETACARPEL SUSPENSION PLASTY Right 03/28/2018   Procedure: CARPOMETACARPEL [facility]) SUSPENSION PLASTY AND TRAPEZIECTOMY;  Surgeon: Leanora Cover, MD;  Location: [place];  Service: Orthopedics;  Laterality: Right;   COLONOSCOPY  1999   normal (in [place])   ESOPHAGUS SURGERY  2003   torn esophagus   [place], 2012, 2013   for cholesteatoma, bilateral first L then R   NECK SURGERY  2002   spinal stenosis and arthritis   perianal abscess  1998    Medications: Current Outpatient Medications  Medication Sig Dispense Refill   ARIPiprazole (ABILIFY) 2 MG tablet Take 2 mg by mouth at bedtime.     doxycycline (VIBRA-TABS) 100 MG tablet Take 100 mg by mouth 2 (two) times daily. As needed     fluticasone (FLONASE) 50 MCG/ACT nasal spray Place 2 sprays into both nostrils daily. 16 g 0   ibuprofen (ADVIL) 200 MG tablet Take 3 tablets (600 mg total) by mouth 2 (two) times daily as needed for moderate pain.     ketoconazole (NIZORAL) 2 % cream Apply topically 2 (two) times daily. As needed     levocetirizine (XYZAL) 5 MG tablet Take 1 tablet (5 mg total) by mouth every evening. 30 tablet 0   lithium carbonate (LITHOBID) 300 MG CR tablet Take 2 tablets (600 mg total) by mouth 2 (two) times daily.     metroNIDAZOLE (METROGEL) 0.75 % gel SMARTSIG:1 Sparingly Topical Every Night     SULFACLEANSE 8/4 8-4 % SUSP Apply 1 application topically 2 (two) times daily. As needed     triamcinolone ointment (KENALOG) 0.1 % Apply topically 2 (two) times daily. As needed     valACYclovir (VALTREX) 500 MG tablet TAKE 1 TABLET BY MOUTH TWICE DAILY.  TAKE 3 DAYS AT A TIME AS NEEDED 30 tablet 1   No current facility-administered medications for this visit.    Allergies  Allergen Reactions   Morphine And Related Itching          Risk Evaluation and Mitigation Strategies (REMS)  The medication you are ordering is associated with Risk Evaluation and Mitigation Strategies (REMS) and has Elements to [employer] Use (ETASU).   Please use the link below to the FDA website to assure all ETASU are addressed for this medication.  [email or web address]   Risk Evaluation and Mitigation Strategies (REMS)  The medication you are ordering is associated with Risk Evaluation and Mitigation Strategies (REMS) and has Elements to [employer] Use (ETASU).   Please use the link below to the FDA website  to assure all ETASU are addressed for this medication.  [email or web address] and normal is between      No data to display          Risk Evaluation and Mitigation Strategies (REMS)  The medication you are ordering is associated with Risk Evaluation and Mitigation Strategies (REMS) and has Elements to [employer] Use (ETASU).   Please use the link below to the FDA website to assure all ETASU are addressed for this medication.  [email or web address] and normal is      No data to display            Goals: Patient is seeking counseling for mood stabilization. Will utilize cognitive strategies to help manage feelings of anxiety and despair. Target date is 12-23. Monitor medication compliance -Ongoing. Session was virtual (video) at patient request. He is at home and provider in home office.  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 Has had a number of interviews but no offers at this time. He does have a number of interviews set up. No idea why  he is not getting offers.                        While very frustrated he is remaining reasonably optimistic. He does say that sometimes he gets easily agitated and can have a short temper.  He said that he and wife are finding the camper to be a disappointment. They had to have work done and needed to spend a lot of time cleaning up afterward. He thinks that they can move past these disappointments. We talked about maintaining a positive attitude and keep himself from falling into a depression. He is facing a lot of stressors that make him more vulnerable.                Diagnoses:  Bi Polar Disorder  Plan of Care: Outpatient Therapy and psychotropic medication   Marcelina Morel, PhD Time:4:10-5:00p 50 min.

## 2022-02-06 ENCOUNTER — Ambulatory Visit (INDEPENDENT_AMBULATORY_CARE_PROVIDER_SITE_OTHER): Payer: BC Managed Care – PPO | Admitting: Psychology

## 2022-02-06 DIAGNOSIS — F319 Bipolar disorder, unspecified: Secondary | ICD-10-CM

## 2022-02-06 NOTE — Progress Notes (Signed)
[place] Counselor Initial Adult Exam  Name: Erik Smith Date: 02/06/2022 MRN: [id number] DOB: 06-30-1965 PCP: Ria Bush, MD     [name] participated from home, via video, and consented to treatment. Therapist participated from home office.   Guardian/Payee:  N/A    Paperwork requested: No   Reason for Visit /Presenting Problem: Improve/manage feeling of agitation  Mental Status Exam: Appearance:   Fairly Groomed     Behavior:  Appropriate  Motor:  Normal  Speech/Language:   Normal Rate  Affect:  Appropriate  Mood:  normal  Thought process:  normal  Thought content:    WNL  Sensory/Perceptual disturbances:    WNL  Orientation:  oriented to person, place, and situation  Attention:  Good  Concentration:  Good  Memory:  WNL  Fund of knowledge:   Good  Insight:    Good  Judgment:   Good  Impulse Control:  Good     Reported Symptoms:  Agitation, frustration, worry  Risk Assessment: Danger to Self:  No Self-injurious Behavior: No Danger to Others: No Duty to Warn:no Physical Aggression / Violence:No  Access to Firearms a concern:  unknown Gang Involvement:No  Patient / guardian was educated about steps to take if suicide or homicide risk level increases between visits: n/a While future psychiatric events cannot be accurately predicted, the patient does not currently require acute inpatient psychiatric care and does not currently meet [facility] involuntary commitment criteria.  Substance Abuse History: Current substance abuse: No     Past Psychiatric History:   Previous psychological history is significant for Bipolar Mania Outpatient Providers:Dr. Pervis Hocking History of Psych Hospitalization: Yes  Psychological Testing:  N/A    Abuse History:  Victim of: No.,  N/A    Report needed: No. Victim of Neglect:No. Perpetrator of  N/A   Witness / Exposure to Domestic Violence: No   Protective Services Involvement: No  Witness  to [employer] Violence:  No   Family History:  Family History  Problem Relation Age of Onset   Coronary artery disease Father 69   Bladder Cancer Father    Sickle cell anemia Mother    Asthma Mother 57        smoker   Prostate cancer Paternal Grandfather 23       deceased from prostate CA   Lung cancer Paternal Grandmother 57       smoker   Breast cancer Paternal Uncle    Stroke Neg Hx    Diabetes Neg Hx    Colon cancer Neg Hx    Colon polyps Neg Hx    Esophageal cancer Neg Hx    Rectal cancer Neg Hx    Stomach cancer Neg Hx     Living situation: the patient lives with their spouse  Sexual Orientation: Straight  Relationship Status: married  Name of spouse / other:Erik Smith If a parent, number of children / ages:2 boys, 87 & 58  Support Systems: family  Financial Stress:  No   Income/Employment/Disability: Employment  Armed forces logistics/support/administrative officer:  unknown  Educational History: Education: post Forensic psychologist work or degree  Religion/Sprituality/World View: unknown  Any cultural differences that may affect / interfere with treatment:  not applicable   Recreation/Hobbies: woodworking  Stressors: Occupational concerns    Strengths: Family  Barriers:  unknown   Legal History: Pending legal issue / charges: The patient has no significant history of legal issues. History of legal issue / charges:  N/A  Medical History/Surgical History: not reviewed Past  Medical History:  Diagnosis Date   Arthritis    Asthma    as a child   Bipolar disorder, unspecified ([place])    hospitalization 2004 for depression, bipolar, ambien overdose   Cancer ([place]) 10/20/2015   skin ca on nose removed   Cholesteatoma 05/2010   s/p R ear surgery, residual tinnitus, planning on L ear surgery   Depression    bipolar   Family history of ischemic heart disease    History of asthma    Migraine without aura, without mention of intractable migraine without mention of status migrainosus    Rosacea  2017   Spinal stenosis    Unspecified adjustment reaction     Past Surgical History:  Procedure Laterality Date   CARPOMETACARPEL SUSPENSION PLASTY Left 10/18/2017   Procedure: LEFT THUMB TRAPEZIECTOMY AND SUSPENSIONPLASTY;  Surgeon: Leanora Cover, MD;  Location: [place];  Service: Orthopedics;  Laterality: Left;   CARPOMETACARPEL SUSPENSION PLASTY Right 03/28/2018   Procedure: CARPOMETACARPEL [facility]) SUSPENSION PLASTY AND TRAPEZIECTOMY;  Surgeon: Leanora Cover, MD;  Location: [place];  Service: Orthopedics;  Laterality: Right;   COLONOSCOPY  1999   normal (in [place])   ESOPHAGUS SURGERY  2003   torn esophagus   [place], 2012, 2013   for cholesteatoma, bilateral first L then R   NECK SURGERY  2002   spinal stenosis and arthritis   perianal abscess  1998    Medications: Current Outpatient Medications  Medication Sig Dispense Refill   ARIPiprazole (ABILIFY) 2 MG tablet Take 2 mg by mouth at bedtime.     doxycycline (VIBRA-TABS) 100 MG tablet Take 100 mg by mouth 2 (two) times daily. As needed     fluticasone (FLONASE) 50 MCG/ACT nasal spray Place 2 sprays into both nostrils daily. 16 g 0   ibuprofen (ADVIL) 200 MG tablet Take 3 tablets (600 mg total) by mouth 2 (two) times daily as needed for moderate pain.     ketoconazole (NIZORAL) 2 % cream Apply topically 2 (two) times daily. As needed     levocetirizine (XYZAL) 5 MG tablet Take 1 tablet (5 mg total) by mouth every evening. 30 tablet 0   lithium carbonate (LITHOBID) 300 MG CR tablet Take 2 tablets (600 mg total) by mouth 2 (two) times daily.     metroNIDAZOLE (METROGEL) 0.75 % gel SMARTSIG:1 Sparingly Topical Every Night     SULFACLEANSE 8/4 8-4 % SUSP Apply 1 application topically 2 (two) times daily. As needed     triamcinolone ointment (KENALOG) 0.1 % Apply topically 2 (two) times daily. As needed     valACYclovir (VALTREX) 500 MG tablet TAKE 1 TABLET BY MOUTH TWICE  DAILY. TAKE 3 DAYS AT A TIME AS NEEDED 30 tablet 1   No current facility-administered medications for this visit.    Allergies  Allergen Reactions   Morphine And Related Itching          Risk Evaluation and Mitigation Strategies (REMS)  The medication you are ordering is associated with Risk Evaluation and Mitigation Strategies (REMS) and has Elements to [employer] Use (ETASU).   Please use the link below to the FDA website to assure all ETASU are addressed for this medication.  [email or web address]   Risk Evaluation and Mitigation Strategies (REMS)  The medication you are ordering is associated with Risk Evaluation and Mitigation Strategies (REMS) and has Elements to [employer] Use (ETASU).   Please use the link below to the FDA  website to assure all ETASU are addressed for this medication.  [email or web address] and normal is between      No data to display          Risk Evaluation and Mitigation Strategies (REMS)  The medication you are ordering is associated with Risk Evaluation and Mitigation Strategies (REMS) and has Elements to [employer] Use (ETASU).   Please use the link below to the FDA website to assure all ETASU are addressed for this medication.  [email or web address] and normal is      No data to display            Goals: Patient is seeking counseling for mood stabilization. Will utilize cognitive strategies to help manage feelings of anxiety and despair. Target date is 12-23. Monitor medication compliance -Ongoing. Session was virtual (video) at patient request. He is at home and provider in home office.  Session Notes: Zoe says he is doing well. Visited his 67 year old step son over the weekend. He has [place] and it is having a number of "small" problems, which has been annoying. He is taking a positive  perspective and looks at the situation as a good distraction. He says his moods have been overall very good. He is slightly in the manic side, but "it is not out of control". He thinks it is slightly beneficial. He does acknowledge having some anger, but realizes he isn't in control of the circumstances, and is determined not to let his upset over the situation defeat him. His goal is to focus on gratitude and appreciation for all he has, and he feels it has "made a lot of progress". He refuses to be a victim and says "I am determined to control how I react to things". He expresses confidence that he will be fine and is less agitated tan before.                     Diagnoses:  Bi Polar Disorder  Plan of Care: Outpatient Therapy and psychotropic medication   Marcelina Morel, PhD Time:4:10-5:00p 50 min.

## 2022-02-20 ENCOUNTER — Ambulatory Visit (INDEPENDENT_AMBULATORY_CARE_PROVIDER_SITE_OTHER): Payer: BC Managed Care – PPO | Admitting: Psychology

## 2022-02-20 DIAGNOSIS — F319 Bipolar disorder, unspecified: Secondary | ICD-10-CM

## 2022-02-20 NOTE — Progress Notes (Signed)
[place] Counselor Initial Adult Exam  Name: Erik Smith Date: 02/20/2022 MRN: [id number] DOB: 1965-07-10 PCP: Ria Bush, MD     [name] participated from home, via video, and consented to treatment. Therapist participated from home office.   Guardian/Payee:  N/A    Paperwork requested: No   Reason for Visit /Presenting Problem: Improve/manage feeling of agitation  Mental Status Exam: Appearance:   Fairly Groomed     Behavior:  Appropriate  Motor:  Normal  Speech/Language:   Normal Rate  Affect:  Appropriate  Mood:  normal  Thought process:  normal  Thought content:    WNL  Sensory/Perceptual disturbances:    WNL  Orientation:  oriented to person, place, and situation  Attention:  Good  Concentration:  Good  Memory:  WNL  Fund of knowledge:   Good  Insight:    Good  Judgment:   Good  Impulse Control:  Good     Reported Symptoms:  Agitation, frustration, worry  Risk Assessment: Danger to Self:  No Self-injurious Behavior: No Danger to Others: No Duty to Warn:no Physical Aggression / Violence:No  Access to Firearms a concern:  unknown Gang Involvement:No  Patient / guardian was educated about steps to take if suicide or homicide risk level increases between visits: n/a While future psychiatric events cannot be accurately predicted, the patient does not currently require acute inpatient psychiatric care and does not currently meet [facility] involuntary commitment criteria.  Substance Abuse History: Current substance abuse: No     Past Psychiatric History:   Previous psychological history is significant for Bipolar Mania Outpatient Providers:Dr. Pervis Hocking History of Psych Hospitalization: Yes  Psychological Testing:  N/A    Abuse History:  Victim of: No.,  N/A    Report needed: No. Victim of Neglect:No. Perpetrator of  N/A   Witness / Exposure to Domestic Violence: No   Protective  Services Involvement: No  Witness to [employer] Violence:  No   Family History:  Family History  Problem Relation Age of Onset   Coronary artery disease Father 75   Bladder Cancer Father    Sickle cell anemia Mother    Asthma Mother        smoker   Prostate cancer Paternal Grandfather 7       deceased from prostate CA   Lung cancer Paternal Grandmother 32       smoker   Breast cancer Paternal Uncle    Stroke Neg Hx    Diabetes Neg Hx    Colon cancer Neg Hx    Colon polyps Neg Hx    Esophageal cancer Neg Hx    Rectal cancer Neg Hx    Stomach cancer Neg Hx     Living situation: the patient lives with their spouse  Sexual Orientation: Straight  Relationship Status: married  Name of spouse / other:Erik Smith If a parent, number of children / ages:2 boys, 6 & 61  Support Systems: family  Financial Stress:  No   Income/Employment/Disability: Employment  Armed forces logistics/support/administrative officer:  unknown  Educational History: Education: post Forensic psychologist work or degree  Religion/Sprituality/World View: unknown  Any cultural differences that may affect / interfere with treatment:  not applicable   Recreation/Hobbies: woodworking  Stressors: Occupational concerns    Strengths: Family  Barriers:  unknown   Legal History: Pending legal issue / charges: The patient has no significant history of legal issues.  History of legal issue / charges:  N/A  Medical History/Surgical History: not reviewed Past Medical History:  Diagnosis Date   Arthritis    Asthma    as a child   Bipolar disorder, unspecified ([place])    hospitalization 2004 for depression, bipolar, ambien overdose   Cancer ([place]) 10/20/2015   skin ca on nose removed   Cholesteatoma 05/2010   s/p R ear surgery, residual tinnitus, planning on L ear surgery   Depression    bipolar   Family history of ischemic heart disease    History of asthma    Migraine without aura, without mention of intractable migraine without mention of  status migrainosus    Rosacea 2017   Spinal stenosis    Unspecified adjustment reaction     Past Surgical History:  Procedure Laterality Date   CARPOMETACARPEL SUSPENSION PLASTY Left 10/18/2017   Procedure: LEFT THUMB TRAPEZIECTOMY AND SUSPENSIONPLASTY;  Surgeon: Leanora Cover, MD;  Location: [place];  Service: Orthopedics;  Laterality: Left;   CARPOMETACARPEL SUSPENSION PLASTY Right 03/28/2018   Procedure: CARPOMETACARPEL [facility]) SUSPENSION PLASTY AND TRAPEZIECTOMY;  Surgeon: Leanora Cover, MD;  Location: [place];  Service: Orthopedics;  Laterality: Right;   COLONOSCOPY  1999   normal (in [place])   ESOPHAGUS SURGERY  2003   torn esophagus   [place], 2012, 2013   for cholesteatoma, bilateral first L then R   NECK SURGERY  2002   spinal stenosis and arthritis   perianal abscess  1998    Medications: Current Outpatient Medications  Medication Sig Dispense Refill   ARIPiprazole (ABILIFY) 2 MG tablet Take 2 mg by mouth at bedtime.     doxycycline (VIBRA-TABS) 100 MG tablet Take 100 mg by mouth 2 (two) times daily. As needed     fluticasone (FLONASE) 50 MCG/ACT nasal spray Place 2 sprays into both nostrils daily. 16 g 0   ibuprofen (ADVIL) 200 MG tablet Take 3 tablets (600 mg total) by mouth 2 (two) times daily as needed for moderate pain.     ketoconazole (NIZORAL) 2 % cream Apply topically 2 (two) times daily. As needed     levocetirizine (XYZAL) 5 MG tablet Take 1 tablet (5 mg total) by mouth every evening. 30 tablet 0   lithium carbonate (LITHOBID) 300 MG CR tablet Take 2 tablets (600 mg total) by mouth 2 (two) times daily.     metroNIDAZOLE (METROGEL) 0.75 % gel SMARTSIG:1 Sparingly Topical Every Night     SULFACLEANSE 8/4 8-4 % SUSP Apply 1 application topically 2 (two) times daily. As needed     triamcinolone ointment (KENALOG) 0.1 % Apply topically 2 (two) times daily. As needed     valACYclovir (VALTREX) 500 MG tablet  TAKE 1 TABLET BY MOUTH TWICE DAILY. TAKE 3 DAYS AT A TIME AS NEEDED 30 tablet 1   No current facility-administered medications for this visit.    Allergies  Allergen Reactions   Morphine And Related Itching          Risk Evaluation and Mitigation Strategies (REMS)  The medication you are ordering is associated with Risk Evaluation and Mitigation Strategies (REMS) and has Elements to [employer] Use (ETASU).   Please use the link below to the FDA website to assure all ETASU are addressed for this medication.  [email or web address]   Risk Evaluation and Mitigation Strategies (REMS)  The medication you are ordering is associated with Risk Evaluation and Mitigation Strategies (REMS) and has Elements  to [employer] (ETASU).   Please use the link below to the FDA website to assure all ETASU are addressed for this medication.  [email or web address] and normal is between      No data to display          Risk Evaluation and Mitigation Strategies (REMS)  The medication you are ordering is associated with Risk Evaluation and Mitigation Strategies (REMS) and has Elements to [employer] Use (ETASU).   Please use the link below to the FDA website to assure all ETASU are addressed for this medication.  [email or web address] and normal is      No data to display            Goals: Patient is seeking counseling for mood stabilization. Will utilize cognitive strategies to help manage feelings of anxiety and despair. Target date is 12-23. Monitor medication compliance -Ongoing. Session was virtual (video) at patient request. He is at home and provider in home office.  Session Notes: Ryen went to [place] to check on his mother last wekend and it went well. He thinks that her memory has become more of a struggle. He has someone coming in to help her manage the help 3  days/week. He feels one option is for his mother to move here and get into senior housing asap. He reports feeling very demoralized and down about not finding a new position. He says there are many openings and he is constantly overlooked. Has no idea why this is happening and he is frustrated that he has no feedback about why he is not being considered. Wife is trying to be supportive, but she is limited with regard to what she can do. Tries to look on bright side which is that he can be more available to his son in his current position.                         Diagnoses:  Bi Polar Disorder  Plan of Care: Outpatient Therapy and psychotropic medication   Marcelina Morel, PhD Time:4:10-5:00p 50 min.

## 2022-03-06 ENCOUNTER — Ambulatory Visit (INDEPENDENT_AMBULATORY_CARE_PROVIDER_SITE_OTHER): Payer: BC Managed Care – PPO | Admitting: Psychology

## 2022-03-06 DIAGNOSIS — F319 Bipolar disorder, unspecified: Secondary | ICD-10-CM

## 2022-03-06 NOTE — Progress Notes (Signed)
[place] Counselor Initial Adult Exam  Name: Erik Smith Date: 03/06/2022 MRN: [id number] DOB: 06-12-1965 PCP: Ria Bush, MD     [name] participated from home, via video, and consented to treatment. Therapist participated from home office.   Guardian/Payee:  N/A    Paperwork requested: No   Reason for Visit /Presenting Problem: Improve/manage feeling of agitation  Mental Status Exam: Appearance:   Fairly Groomed     Behavior:  Appropriate  Motor:  Normal  Speech/Language:   Normal Rate  Affect:  Appropriate  Mood:  normal  Thought process:  normal  Thought content:    WNL  Sensory/Perceptual disturbances:    WNL  Orientation:  oriented to person, place, and situation  Attention:  Good  Concentration:  Good  Memory:  WNL  Fund of knowledge:   Good  Insight:    Good  Judgment:   Good  Impulse Control:  Good     Reported Symptoms:  Agitation, frustration, worry  Risk Assessment: Danger to Self:  No Self-injurious Behavior: No Danger to Others: No Duty to Warn:no Physical Aggression / Violence:No  Access to Firearms a concern:  unknown Gang Involvement:No  Patient / guardian was educated about steps to take if suicide or homicide risk level increases between visits: n/a While future psychiatric events cannot be accurately predicted, the patient does not currently require acute inpatient psychiatric care and does not currently meet [facility] involuntary commitment criteria.  Substance Abuse History: Current substance abuse: No     Past Psychiatric History:   Previous psychological history is significant for Bipolar Mania Outpatient Providers:Dr. Pervis Hocking History of Psych Hospitalization: Yes  Psychological Testing:  N/A    Abuse History:  Victim of: No.,  N/A    Report needed: No. Victim of Neglect:No. Perpetrator of  N/A   Witness / Exposure to Domestic  Violence: No   Protective Services Involvement: No  Witness to [employer] Violence:  No   Family History:  Family History  Problem Relation Age of Onset   Coronary artery disease Father 31   Bladder Cancer Father    Sickle cell anemia Mother    Asthma Mother        smoker   Prostate cancer Paternal Grandfather 84       deceased from prostate CA   Lung cancer Paternal Grandmother 76       smoker   Breast cancer Paternal Uncle    Stroke Neg Hx    Diabetes Neg Hx    Colon cancer Neg Hx    Colon polyps Neg Hx    Esophageal cancer Neg Hx    Rectal cancer Neg Hx    Stomach cancer Neg Hx     Living situation: the patient lives with their spouse  Sexual Orientation: Straight  Relationship Status: married  Name of spouse / other:[name] If a parent, number of children / ages:2 boys, 63 & 63  Support Systems: family  Financial Stress:  No   Income/Employment/Disability: Employment  Armed forces logistics/support/administrative officer:  unknown  Educational History: Education: post Forensic psychologist work or degree  Religion/Sprituality/World View: unknown  Any cultural differences that may affect / interfere with treatment:  not applicable   Recreation/Hobbies: woodworking  Stressors: Occupational concerns    Strengths: Family  Barriers:  unknown   Scientist, research (physical sciences)  History: Pending legal issue / charges: The patient has no significant history of legal issues. History of legal issue / charges:  N/A  Medical History/Surgical History: not reviewed Past Medical History:  Diagnosis Date   Arthritis    Asthma    as a child   Bipolar disorder, unspecified ([place])    hospitalization 2004 for depression, bipolar, ambien overdose   Cancer ([place]) 10/20/2015   skin ca on nose removed   Cholesteatoma 05/2010   s/p R ear surgery, residual tinnitus, planning on L ear surgery   Depression    bipolar   Family history of ischemic heart disease    History of asthma    Migraine without aura, without mention of intractable  migraine without mention of status migrainosus    Rosacea 2017   Spinal stenosis    Unspecified adjustment reaction     Past Surgical History:  Procedure Laterality Date   CARPOMETACARPEL SUSPENSION PLASTY Left 10/18/2017   Procedure: LEFT THUMB TRAPEZIECTOMY AND SUSPENSIONPLASTY;  Surgeon: Leanora Cover, MD;  Location: [place];  Service: Orthopedics;  Laterality: Left;   CARPOMETACARPEL SUSPENSION PLASTY Right 03/28/2018   Procedure: CARPOMETACARPEL [facility]) SUSPENSION PLASTY AND TRAPEZIECTOMY;  Surgeon: Leanora Cover, MD;  Location: [place];  Service: Orthopedics;  Laterality: Right;   COLONOSCOPY  1999   normal (in [place])   ESOPHAGUS SURGERY  2003   torn esophagus   [place], 2012, 2013   for cholesteatoma, bilateral first L then R   NECK SURGERY  2002   spinal stenosis and arthritis   perianal abscess  1998    Medications: Current Outpatient Medications  Medication Sig Dispense Refill   ARIPiprazole (ABILIFY) 2 MG tablet Take 2 mg by mouth at bedtime.     doxycycline (VIBRA-TABS) 100 MG tablet Take 100 mg by mouth 2 (two) times daily. As needed     fluticasone (FLONASE) 50 MCG/ACT nasal spray Place 2 sprays into both nostrils daily. 16 g 0   ibuprofen (ADVIL) 200 MG tablet Take 3 tablets (600 mg total) by mouth 2 (two) times daily as needed for moderate pain.     ketoconazole (NIZORAL) 2 % cream Apply topically 2 (two) times daily. As needed     levocetirizine (XYZAL) 5 MG tablet Take 1 tablet (5 mg total) by mouth every evening. 30 tablet 0   lithium carbonate (LITHOBID) 300 MG CR tablet Take 2 tablets (600 mg total) by mouth 2 (two) times daily.     metroNIDAZOLE (METROGEL) 0.75 % gel SMARTSIG:1 Sparingly Topical Every Night     SULFACLEANSE 8/4 8-4 % SUSP Apply 1 application topically 2 (two) times daily. As needed     triamcinolone ointment (KENALOG) 0.1 % Apply topically 2 (two) times daily. As needed      valACYclovir (VALTREX) 500 MG tablet TAKE 1 TABLET BY MOUTH TWICE DAILY. TAKE 3 DAYS AT A TIME AS NEEDED 30 tablet 1   No current facility-administered medications for this visit.    Allergies  Allergen Reactions   Morphine And Related Itching          Risk Evaluation and Mitigation Strategies (REMS)  The medication you are ordering is associated with Risk Evaluation and Mitigation Strategies (REMS) and has Elements to [employer] Use (ETASU).   Please use the link below to the FDA website to assure all ETASU are addressed for this medication.  [email or web address]   Risk Evaluation and Mitigation Strategies (REMS)  The medication  you are ordering is associated with Risk Evaluation and Mitigation Strategies (REMS) and has Elements to [employer] (ETASU).   Please use the link below to the FDA website to assure all ETASU are addressed for this medication.  [email or web address] and normal is between      No data to display          Risk Evaluation and Mitigation Strategies (REMS)  The medication you are ordering is associated with Risk Evaluation and Mitigation Strategies (REMS) and has Elements to [employer] Use (ETASU).   Please use the link below to the FDA website to assure all ETASU are addressed for this medication.  [email or web address] and normal is      No data to display            Goals: Patient is seeking counseling for mood stabilization. Will utilize cognitive strategies to help manage feelings of anxiety and despair. Target date is 12-23. Monitor medication compliance -Ongoing. Session was virtual (video) at patient request. He is at home and provider in home office.  Session Notes: Clint says he is okay, but is fatiguing of the chronic rejections for an [employer] job. He has expanded his search, is getting interviews, but continues to get  passed over. He wants to continue making applications rather than take a break, for fear of missing a good opportunity.  His mother's doctor called and told Sundeep that he is going to withdraw his mother's driving privileges. He has has a lot on his plate with regard to caring for his mother. He is thinking it may be best he doesn't have a new job so he can be freed up to focus on the care of his mother. He also needs to be focusing on self care and leisure activity.                              Diagnoses:  Bi Polar Disorder  Plan of Care: Outpatient Therapy and psychotropic medication   Marcelina Morel, PhD Time:4:10-5:00p 50 min.

## 2022-03-20 ENCOUNTER — Ambulatory Visit (INDEPENDENT_AMBULATORY_CARE_PROVIDER_SITE_OTHER): Payer: BC Managed Care – PPO | Admitting: Psychology

## 2022-03-20 DIAGNOSIS — F319 Bipolar disorder, unspecified: Secondary | ICD-10-CM | POA: Diagnosis not present

## 2022-03-20 NOTE — Progress Notes (Signed)
[place] Counselor Initial Adult Exam  Name: Erik Smith Date: 03/20/2022 MRN: [id number] DOB: 30-Jul-1965 PCP: Erik Bush, MD     Erik Smith participated from home, via video, and consented to treatment. Therapist participated from home office.   Guardian/Payee:  N/A    Paperwork requested: No   Reason for Visit /Presenting Problem: Improve/manage feeling of agitation  Mental Status Exam: Appearance:   Fairly Groomed     Behavior:  Appropriate  Motor:  Normal  Speech/Language:   Normal Rate  Affect:  Appropriate  Mood:  normal  Thought process:  normal  Thought content:    WNL  Sensory/Perceptual disturbances:    WNL  Orientation:  oriented to person, place, and situation  Attention:  Good  Concentration:  Good  Memory:  WNL  Fund of knowledge:   Good  Insight:    Good  Judgment:   Good  Impulse Control:  Good     Reported Symptoms:  Agitation, frustration, worry  Risk Assessment: Danger to Self:  No Self-injurious Behavior: No Danger to Others: No Duty to Warn:no Physical Aggression / Violence:No  Access to Firearms a concern:  unknown Gang Involvement:No  Patient / guardian was educated about steps to take if suicide or homicide risk level increases between visits: n/a While future psychiatric events cannot be accurately predicted, the patient does not currently require acute inpatient psychiatric care and does not currently meet [facility] involuntary commitment criteria.  Substance Abuse History: Current substance abuse: No     Past Psychiatric History:   Previous psychological history is significant for Bipolar Mania Outpatient Providers:Dr. Pervis Hocking History of Psych Hospitalization: Yes  Psychological Testing:  N/A    Abuse History:  Victim of: No.,  N/A    Report needed: No. Victim of Neglect:No. Perpetrator of  N/A    Witness / Exposure to Domestic Violence: No   Protective Services Involvement: No  Witness to [employer] Violence:  No   Family History:  Family History  Problem Relation Age of Onset   Coronary artery disease Father 48   Bladder Cancer Father    Sickle cell anemia Mother    Asthma Mother        smoker   Prostate cancer Paternal Grandfather 24       deceased from prostate CA   Lung cancer Paternal Grandmother 60       smoker   Breast cancer Paternal Uncle    Stroke Neg Hx    Diabetes Neg Hx    Colon cancer Neg Hx    Colon polyps Neg Hx    Esophageal cancer Neg Hx    Rectal cancer Neg Hx    Stomach cancer Neg Hx     Living situation: the patient lives with their spouse  Sexual Orientation: Straight  Relationship Status: married  Name of spouse / other:Erik Smith If a parent, number of children / ages:2 boys, 10 & 35  Support Systems: family  Financial Stress:  No   Income/Employment/Disability: Employment  Armed forces logistics/support/administrative officer:  unknown  Educational History: Education: post Forensic psychologist work or degree  Religion/Sprituality/World View: unknown  Any cultural differences that may affect / interfere with treatment:  not applicable   Recreation/Hobbies: woodworking  Stressors: Occupational concerns    Strengths: Family  Barriers:  unknown   Legal History: Pending legal issue / charges: The patient has no significant history of legal issues. History of legal issue / charges:  N/A  Medical History/Surgical History: not reviewed Past Medical History:  Diagnosis Date   Arthritis    Asthma    as a child   Bipolar disorder, unspecified ([place])    hospitalization 2004 for depression, bipolar, ambien overdose   Cancer ([place]) 10/20/2015   skin ca on nose removed   Cholesteatoma 05/2010   s/p R ear surgery, residual tinnitus, planning on L ear surgery   Depression    bipolar   Family history of ischemic heart disease    History of asthma    Migraine without  aura, without mention of intractable migraine without mention of status migrainosus    Rosacea 2017   Spinal stenosis    Unspecified adjustment reaction     Past Surgical History:  Procedure Laterality Date   CARPOMETACARPEL SUSPENSION PLASTY Left 10/18/2017   Procedure: LEFT THUMB TRAPEZIECTOMY AND SUSPENSIONPLASTY;  Surgeon: Erik Cover, MD;  Location: [place];  Service: Orthopedics;  Laterality: Left;   CARPOMETACARPEL SUSPENSION PLASTY Right 03/28/2018   Procedure: CARPOMETACARPEL [facility]) SUSPENSION PLASTY AND TRAPEZIECTOMY;  Surgeon: Erik Cover, MD;  Location: [place];  Service: Orthopedics;  Laterality: Right;   COLONOSCOPY  1999   normal (in [place])   ESOPHAGUS SURGERY  2003   torn esophagus   [place], 2012, 2013   for cholesteatoma, bilateral first L then R   NECK SURGERY  2002   spinal stenosis and arthritis   perianal abscess  1998    Medications: Current Outpatient Medications  Medication Sig Dispense Refill   ARIPiprazole (ABILIFY) 2 MG tablet Take 2 mg by mouth at bedtime.     doxycycline (VIBRA-TABS) 100 MG tablet Take 100 mg by mouth 2 (two) times daily. As needed     fluticasone (FLONASE) 50 MCG/ACT nasal spray Place 2 sprays into both nostrils daily. 16 g 0   ibuprofen (ADVIL) 200 MG tablet Take 3 tablets (600 mg total) by mouth 2 (two) times daily as needed for moderate pain.     ketoconazole (NIZORAL) 2 % cream Apply topically 2 (two) times daily. As needed     levocetirizine (XYZAL) 5 MG tablet Take 1 tablet (5 mg total) by mouth every evening. 30 tablet 0   lithium carbonate (LITHOBID) 300 MG CR tablet Take 2 tablets (600 mg total) by mouth 2 (two) times daily.     metroNIDAZOLE (METROGEL) 0.75 % gel SMARTSIG:1 Sparingly Topical Every Night     SULFACLEANSE 8/4 8-4 % SUSP Apply 1 application topically 2 (two) times daily. As needed     triamcinolone ointment (KENALOG) 0.1 % Apply topically 2 (two)  times daily. As needed     valACYclovir (VALTREX) 500 MG tablet TAKE 1 TABLET BY MOUTH TWICE DAILY. TAKE 3 DAYS AT A TIME AS NEEDED 30 tablet 1   No current facility-administered medications for this visit.    Allergies  Allergen Reactions   Morphine And Related Itching          Risk Evaluation and Mitigation Strategies (REMS)  The medication you are ordering is associated with Risk Evaluation and Mitigation Strategies (REMS) and has Elements to [employer] Use (ETASU).   Please use the link below to the FDA website to assure all ETASU are addressed for  this medication.  [email or web address]   Risk Evaluation and Mitigation Strategies (REMS)  The medication you are ordering is associated with Risk Evaluation and Mitigation Strategies (REMS) and has Elements to [employer] Use (ETASU).   Please use the link below to the FDA website to assure all ETASU are addressed for this medication.  [email or web address] and normal is between      No data to display          Risk Evaluation and Mitigation Strategies (REMS)  The medication you are ordering is associated with Risk Evaluation and Mitigation Strategies (REMS) and has Elements to [employer] Use (ETASU).   Please use the link below to the FDA website to assure all ETASU are addressed for this medication.  [email or web address] and normal is      No data to display            Goals: Patient is seeking counseling for mood stabilization. Will utilize cognitive strategies to help manage feelings of anxiety and despair. Target date is 12-23. Monitor medication compliance -Ongoing. Session was virtual (video) at patient request. He is at home and provider in home office.  Session Notes: Erik Smith says his oldest son and his girlfriend are in town for a few days. 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 Very good visit. He  has been rejected at more jobs, but continues to apply. He continues to misjudge the success of his interviews. He feels he does well, but gets rejected with no feedback. Wife continues to be supportive and he is not giving up. He is enjoying the camper on the weekends.  Will go see his mother for Thanksgiving. She is very distressed that she can no longer drive. Horris is feeling bad about her distress. He will be bring her car to his house. He thinks it will just be himself, [name] and his mother for the holiday. Doesn't anticipate his brother will attend. Will go to a local hotel for T-Giving dinner. He anticipates it will be fine, and does not expect any problems.                                   Diagnoses:  Bi Polar Disorder  Plan of Care: Outpatient Therapy and psychotropic medication   Marcelina Morel, PhD Time:4:10-5:00p 50 min.

## 2022-04-03 ENCOUNTER — Ambulatory Visit (INDEPENDENT_AMBULATORY_CARE_PROVIDER_SITE_OTHER): Payer: BC Managed Care – PPO | Admitting: Psychology

## 2022-04-03 DIAGNOSIS — F319 Bipolar disorder, unspecified: Secondary | ICD-10-CM | POA: Diagnosis not present

## 2022-04-03 NOTE — Progress Notes (Signed)
Erik Smith Initial Adult Exam  Name: Erik Smith Date: 04/03/2022 MRN: [id number] DOB: 05-25-1965 PCP: Ria Bush, MD     Erik Smith participated from home, via video, and consented to treatment. Therapist participated from home office.   Guardian/Payee:  N/A    Paperwork requested: No   Reason for Visit /Presenting Problem: Improve/manage feeling of agitation  Mental Status Exam: Appearance:   Fairly Groomed     Behavior:  Appropriate  Motor:  Normal  Speech/Language:   Normal Rate  Affect:  Appropriate  Mood:  normal  Thought process:  normal  Thought content:    WNL  Sensory/Perceptual disturbances:    WNL  Orientation:  oriented to person, place, and situation  Attention:  Good  Concentration:  Good  Memory:  WNL  Fund of knowledge:   Good  Insight:    Good  Judgment:   Good  Impulse Control:  Good     Reported Symptoms:  Agitation, frustration, worry  Risk Assessment: Danger to Self:  No Self-injurious Behavior: No Danger to Others: No Duty to Warn:no Physical Aggression / Violence:No  Access to Firearms a concern:  unknown Gang Involvement:No  Patient / guardian was educated about steps to take if suicide or homicide risk level increases between visits: n/a While future psychiatric events cannot be accurately predicted, the patient does not currently require acute inpatient psychiatric care and does not currently meet [facility] involuntary commitment criteria.  Substance Abuse History: Current substance abuse: No     Past Psychiatric History:   Previous psychological history is significant for Bipolar Mania Outpatient Providers:Dr. Pervis Hocking History of Psych Hospitalization: Yes  Psychological Testing:  N/A    Abuse History:  Victim of: No.,  N/A    Report needed: No. Victim of  Neglect:No. Perpetrator of  N/A   Witness / Exposure to Domestic Violence: No   Protective Services Involvement: No  Witness to [employer] Violence:  No   Family History:  Family History  Problem Relation Age of Onset   Coronary artery disease Father 33   Bladder Cancer Father    Sickle cell anemia Mother    Asthma Mother        smoker   Prostate cancer Paternal Grandfather 72       deceased from prostate CA   Lung cancer Paternal Grandmother 17       smoker   Breast cancer Paternal Uncle    Stroke Neg Hx    Diabetes Neg Hx    Colon cancer Neg Hx    Colon polyps Neg Hx    Esophageal cancer Neg Hx    Rectal cancer Neg Hx    Stomach cancer Neg Hx     Living situation: the patient lives with their spouse  Sexual Orientation: Straight  Relationship Status: married  Name of spouse / other:[name] If a parent, number of children / ages:2 boys, 30 & 69  Support Systems: family  Financial Stress:  No   Income/Employment/Disability: Employment  Armed forces logistics/support/administrative officer:  unknown  Educational History: Education: post Forensic psychologist work or degree  Religion/Sprituality/World View: unknown  Any cultural differences  that may affect / interfere with treatment:  not applicable   Recreation/Hobbies: woodworking  Stressors: Occupational concerns    Strengths: Family  Barriers:  unknown   Legal History: Pending legal issue / charges: The patient has no significant history of legal issues. History of legal issue / charges:  N/A  Medical History/Surgical History: not reviewed Past Medical History:  Diagnosis Date   Arthritis    Asthma    as a child   Bipolar disorder, unspecified ([place])    hospitalization 2004 for depression, bipolar, ambien overdose   Cancer ([place]) 10/20/2015   skin ca on nose removed   Cholesteatoma 05/2010   s/p R ear surgery, residual tinnitus, planning on L ear surgery   Depression    bipolar   Family history of ischemic heart disease    History  of asthma    Migraine without aura, without mention of intractable migraine without mention of status migrainosus    Rosacea 2017   Spinal stenosis    Unspecified adjustment reaction     Past Surgical History:  Procedure Laterality Date   CARPOMETACARPEL SUSPENSION PLASTY Left 10/18/2017   Procedure: LEFT THUMB TRAPEZIECTOMY AND SUSPENSIONPLASTY;  Surgeon: Leanora Cover, MD;  Location: [place];  Service: Orthopedics;  Laterality: Left;   CARPOMETACARPEL SUSPENSION PLASTY Right 03/28/2018   Procedure: CARPOMETACARPEL [facility]) SUSPENSION PLASTY AND TRAPEZIECTOMY;  Surgeon: Leanora Cover, MD;  Location: [place];  Service: Orthopedics;  Laterality: Right;   COLONOSCOPY  1999   normal (in [place])   ESOPHAGUS SURGERY  2003   torn esophagus   [place], 2012, 2013   for cholesteatoma, bilateral first L then R   NECK SURGERY  2002   spinal stenosis and arthritis   perianal abscess  1998    Medications: Current Outpatient Medications  Medication Sig Dispense Refill   ARIPiprazole (ABILIFY) 2 MG tablet Take 2 mg by mouth at bedtime.     doxycycline (VIBRA-TABS) 100 MG tablet Take 100 mg by mouth 2 (two) times daily. As needed     fluticasone (FLONASE) 50 MCG/ACT nasal spray Place 2 sprays into both nostrils daily. 16 g 0   ibuprofen (ADVIL) 200 MG tablet Take 3 tablets (600 mg total) by mouth 2 (two) times daily as needed for moderate pain.     ketoconazole (NIZORAL) 2 % cream Apply topically 2 (two) times daily. As needed     levocetirizine (XYZAL) 5 MG tablet Take 1 tablet (5 mg total) by mouth every evening. 30 tablet 0   lithium carbonate (LITHOBID) 300 MG CR tablet Take 2 tablets (600 mg total) by mouth 2 (two) times daily.     metroNIDAZOLE (METROGEL) 0.75 % gel SMARTSIG:1 Sparingly Topical Every Night     SULFACLEANSE 8/4 8-4 % SUSP Apply 1 application topically 2 (two) times daily. As needed     triamcinolone ointment (KENALOG)  0.1 % Apply topically 2 (two) times daily. As needed     valACYclovir (VALTREX) 500 MG tablet TAKE 1 TABLET BY MOUTH TWICE DAILY. TAKE 3 DAYS AT A TIME AS NEEDED 30 tablet 1   No current facility-administered medications for this visit.    Allergies  Allergen Reactions   Morphine And Related Itching          Risk Evaluation and Mitigation Strategies (REMS)  The medication you are ordering is associated with Risk Evaluation and Mitigation Strategies (REMS) and has Elements to [employer] Use (ETASU).   Please  use the link below to the FDA website to assure all ETASU are addressed for this medication.  [email or web address]   Risk Evaluation and Mitigation Strategies (REMS)  The medication you are ordering is associated with Risk Evaluation and Mitigation Strategies (REMS) and has Elements to [employer] Use (ETASU).   Please use the link below to the FDA website to assure all ETASU are addressed for this medication.  [email or web address] and normal is between      No data to display          Risk Evaluation and Mitigation Strategies (REMS)  The medication you are ordering is associated with Risk Evaluation and Mitigation Strategies (REMS) and has Elements to [employer] Use (ETASU).   Please use the link below to the FDA website to assure all ETASU are addressed for this medication.  [email or web address] and normal is      No data to display            Goals: Patient is seeking counseling for mood stabilization. Will utilize cognitive strategies to help manage feelings of anxiety and despair. Target date is 12-23. Monitor medication compliance -Ongoing. Session was virtual (video) at patient request. He is at home and provider in home office.  Session Notes: Erik Smith says his family remains dysfunctional. States that his brother treats him poorly because he  is "liberal scum". This brother lives near his mother, but was always claiming to be too busy to help her. His brother is now telling their mother that Erik Smith is trying to steal her money. He has been her power of attorney for years and his claims are completely unfounded. In addition, his brother has been taking advantage of their mother, so Erik Smith reached out to the elder care attorneys for help. He and his wife were going to go to visit his mother for Thanksgiving, but decided not to go because he doesn't feel safe around his brother.  In addition to the holiday stress, he did not get either job that he had applies to in the past couple of weeks. 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                                     Diagnoses:  Bi Polar Disorder  Plan of Care: Outpatient Therapy and psychotropic medication   Marcelina Morel, PhD Time:4:10-5:00p 50 min.

## 2022-04-17 ENCOUNTER — Ambulatory Visit: Payer: BC Managed Care – PPO | Admitting: Psychology

## 2022-04-17 ENCOUNTER — Other Ambulatory Visit: Payer: Self-pay | Admitting: Family Medicine

## 2022-04-18 NOTE — Telephone Encounter (Signed)
Valtrex  Last filled:  10/22/20, #30 Last OV:  10/28/21, CPE Next OV:  10/30/22, CPE

## 2022-04-19 ENCOUNTER — Encounter: Payer: Self-pay | Admitting: Family Medicine

## 2022-05-15 ENCOUNTER — Other Ambulatory Visit: Payer: Self-pay | Admitting: Family Medicine

## 2022-05-15 ENCOUNTER — Ambulatory Visit (INDEPENDENT_AMBULATORY_CARE_PROVIDER_SITE_OTHER): Payer: BC Managed Care – PPO | Admitting: Psychology

## 2022-05-15 DIAGNOSIS — F319 Bipolar disorder, unspecified: Secondary | ICD-10-CM

## 2022-05-15 NOTE — Progress Notes (Signed)
[place] Counselor Initial Adult Exam  Name: Erik Smith Date: 05/15/2022 MRN: [id number] DOB: [birth year] PCP: Ria Bush, MD     [name] participated from home, via video, and consented to treatment. Therapist participated from home office.   Guardian/Payee:  N/A    Paperwork requested: No   Reason for Visit /Presenting Problem: Improve/manage feeling of agitation  Mental Status Exam: Appearance:   Fairly Groomed     Behavior:  Appropriate  Motor:  Normal  Speech/Language:   Normal Rate  Affect:  Appropriate  Mood:  normal  Thought process:  normal  Thought content:    WNL  Sensory/Perceptual disturbances:    WNL  Orientation:  oriented to person, place, and situation  Attention:  Good  Concentration:  Good  Memory:  WNL  Fund of knowledge:   Good  Insight:    Good  Judgment:   Good  Impulse Control:  Good     Reported Symptoms:  Agitation, frustration, worry  Risk Assessment: Danger to Self:  No Self-injurious Behavior: No Danger to Others: No Duty to Warn:no Physical Aggression / Violence:No  Access to Firearms a concern:  unknown Gang Involvement:No  Patient / guardian was educated about steps to take if suicide or homicide risk level increases between visits: n/a While future psychiatric events cannot be accurately predicted, the patient does not currently require acute inpatient psychiatric care and does not currently meet [facility] involuntary commitment criteria.  Substance Abuse History: Current substance abuse: No     Past Psychiatric History:   Previous psychological history is significant for Bipolar Mania Outpatient Providers:Dr. Pervis Hocking History of Psych Hospitalization: Yes  Psychological Testing:  N/A    Abuse History:  Victim of: No.,  N/A    Report needed: No. Victim of Neglect:No. Perpetrator of  N/A   Witness / Exposure to Domestic Violence: No   Protective Services Involvement: No   Witness to [employer] Violence:  No   Family History:  Family History  Problem Relation Age of Onset   Coronary artery disease Father 9   Bladder Cancer Father    Sickle cell anemia Mother    Asthma Mother        smoker   Prostate cancer Paternal Grandfather 8       deceased from prostate CA   Lung cancer Paternal Grandmother 37       smoker   Breast cancer Paternal Uncle    Stroke Neg Hx    Diabetes Neg Hx    Colon cancer Neg Hx    Colon polyps Neg Hx    Esophageal cancer Neg Hx    Rectal cancer Neg Hx    Stomach cancer Neg Hx     Living situation: the patient lives with their spouse  Sexual Orientation: Straight  Relationship Status: married  Name of spouse / other:[name] If a parent, number of children / ages:2 boys, 65 & 65  Support Systems: family  Financial Stress:  No   Income/Employment/Disability: Employment  Armed forces logistics/support/administrative officer:  unknown  Educational History: Education: post Forensic psychologist work or degree  Religion/Sprituality/World View: unknown  Any cultural differences that may affect / interfere with treatment:  not applicable   Recreation/Hobbies: woodworking  Stressors: Occupational concerns    Strengths: Family  Barriers:  unknown   Legal History: Pending legal issue / charges: The patient has no significant history of legal issues. History of legal issue / charges:  N/A  Medical History/Surgical History: not reviewed Past Medical History:  Diagnosis Date   Arthritis    Asthma    as a child   Bipolar disorder, unspecified ([place])    hospitalization 2004 for depression, bipolar, ambien overdose   Cancer ([place]) 10/20/2015   skin ca on nose removed   Cholesteatoma 05/2010   s/p R ear surgery, residual tinnitus, planning on L ear surgery   Depression    bipolar   Family history of ischemic heart disease    History of asthma    Migraine without aura, without mention of intractable migraine without mention of status migrainosus     Rosacea 2017   Spinal stenosis    Unspecified adjustment reaction     Past Surgical History:  Procedure Laterality Date   CARPOMETACARPEL SUSPENSION PLASTY Left 10/18/2017   Procedure: LEFT THUMB TRAPEZIECTOMY AND SUSPENSIONPLASTY;  Surgeon: Leanora Cover, MD;  Location: [place];  Service: Orthopedics;  Laterality: Left;   CARPOMETACARPEL SUSPENSION PLASTY Right 03/28/2018   Procedure: CARPOMETACARPEL [facility]) SUSPENSION PLASTY AND TRAPEZIECTOMY;  Surgeon: Leanora Cover, MD;  Location: [place];  Service: Orthopedics;  Laterality: Right;   COLONOSCOPY  1999   normal (in [place])   ESOPHAGUS SURGERY  2003   torn esophagus   [place], 2012, 2013   for cholesteatoma, bilateral first L then R   NECK SURGERY  2002   spinal stenosis and arthritis   perianal abscess  1998    Medications: Current Outpatient Medications  Medication Sig Dispense Refill   ARIPiprazole (ABILIFY) 2 MG tablet Take 2 mg by mouth at bedtime.     doxycycline (VIBRA-TABS) 100 MG tablet Take 100 mg by mouth 2 (two) times daily. As needed     fluticasone (FLONASE) 50 MCG/ACT nasal spray Place 2 sprays into both nostrils daily. 16 g 0   ibuprofen (ADVIL) 200 MG tablet Take 3 tablets (600 mg total) by mouth 2 (two) times daily as needed for moderate pain.     ketoconazole (NIZORAL) 2 % cream Apply topically 2 (two) times daily. As needed     levocetirizine (XYZAL) 5 MG tablet Take 1 tablet (5 mg total) by mouth every evening. 30 tablet 0   lithium carbonate (LITHOBID) 300 MG CR tablet Take 2 tablets (600 mg total) by mouth 2 (two) times daily.     metroNIDAZOLE (METROGEL) 0.75 % gel SMARTSIG:1 Sparingly Topical Every Night     SULFACLEANSE 8/4 8-4 % SUSP Apply 1 application topically 2 (two) times daily. As needed     triamcinolone ointment (KENALOG) 0.1 % Apply topically 2 (two) times daily. As needed     valACYclovir (VALTREX) 500 MG tablet TAKE 1 TABLET BY MOUTH  TWICE DAILY 3 DAYS AT A TIME AS NEEDED 30 tablet 1   No current facility-administered medications for this visit.    Allergies  Allergen Reactions   Morphine And Related Itching          Risk Evaluation and Mitigation Strategies (REMS)  The medication you are ordering is associated with Risk Evaluation and Mitigation Strategies (REMS) and has Elements to [employer] Use (ETASU).   Please use the link below to the FDA website to assure all ETASU are addressed for this medication.  [email or web address]   Risk Evaluation and Mitigation Strategies (REMS)  The medication you are ordering is associated with Risk Evaluation and Mitigation Strategies (REMS) and has Elements to [employer] Use (ETASU).   Please use the  link below to the FDA website to assure all ETASU are addressed for this medication.  [email or web address] and normal is between      No data to display          Risk Evaluation and Mitigation Strategies (REMS)  The medication you are ordering is associated with Risk Evaluation and Mitigation Strategies (REMS) and has Elements to [employer] Use (ETASU).   Please use the link below to the FDA website to assure all ETASU are addressed for this medication.  [email or web address] and normal is      No data to display            Goals: Patient is seeking counseling for mood stabilization. Will utilize cognitive strategies to help manage feelings of anxiety and despair.  Monitor medication compliance -Ongoing. Session was virtual (video) at patient request. He is at home and provider in home office. Revised target date is 6-24  Session Notes: Dragon says that many things are going well, but there were some challenges during the holiday. He says they all got sick. His wife's family came over (her two kids and mother). Everyone got along well and he felt  it was "a nice Christmas". He is now off Lithium and Abilify, and now takes Xyzal ('5mg'$ ). He thinks it is working for him. He states he is working on getting his mother's trust set up before her competency assessment. He is moving forward without his brother's knowledge. Will set it up as equal division of assets among the three siblings. His job situation remains unchanged. He is still looking at every opportunity that arises. He wants to continue the process and hope he figures out his interviewing struggles. We have speculated what the issue may be, but he does not have a definitive answer.                                            Diagnoses:  Bi Polar Disorder  Plan of Care: Outpatient Therapy and psychotropic medication   Marcelina Morel, PhD Time:4:10-5:00p 50 min.

## 2022-05-15 NOTE — Telephone Encounter (Signed)
Needs OV for refill of doxycycline.

## 2022-05-22 ENCOUNTER — Encounter: Payer: Self-pay | Admitting: Family Medicine

## 2022-05-29 ENCOUNTER — Ambulatory Visit: Payer: BC Managed Care – PPO | Admitting: Psychology

## 2022-06-05 ENCOUNTER — Ambulatory Visit (INDEPENDENT_AMBULATORY_CARE_PROVIDER_SITE_OTHER): Payer: BC Managed Care – PPO | Admitting: Psychology

## 2022-06-05 DIAGNOSIS — F319 Bipolar disorder, unspecified: Secondary | ICD-10-CM | POA: Diagnosis not present

## 2022-06-05 NOTE — Progress Notes (Signed)
[place] Counselor Initial Adult Exam  Name: Audwin Semper Date: 06/05/2022 MRN: [id number] DOB: 03/02/1966 PCP: Ria Bush, MD     [name] participated from home, via video, and consented to treatment. Therapist participated from home office.   Guardian/Payee:  N/A    Paperwork requested: No   Reason for Visit /Presenting Problem: Improve/manage feeling of agitation  Mental Status Exam: Appearance:   Fairly Groomed     Behavior:  Appropriate  Motor:  Normal  Speech/Language:   Normal Rate  Affect:  Appropriate  Mood:  normal  Thought process:  normal  Thought content:    WNL  Sensory/Perceptual disturbances:    WNL  Orientation:  oriented to person, place, and situation  Attention:  Good  Concentration:  Good  Memory:  WNL  Fund of knowledge:   Good  Insight:    Good  Judgment:   Good  Impulse Control:  Good     Reported Symptoms:  Agitation, frustration, worry  Risk Assessment: Danger to Self:  No Self-injurious Behavior: No Danger to Others: No Duty to Warn:no Physical Aggression / Violence:No  Access to Firearms a concern:  unknown Gang Involvement:No  Patient / guardian was educated about steps to take if suicide or homicide risk level increases between visits: n/a While future psychiatric events cannot be accurately predicted, the patient does not currently require acute inpatient psychiatric care and does not currently meet [facility] involuntary commitment criteria.  Substance Abuse History: Current substance abuse: No     Past Psychiatric History:   Previous psychological history is significant for Bipolar Mania Outpatient Providers:Dr. Pervis Hocking History of Psych Hospitalization: Yes  Psychological Testing:  N/A    Abuse History:  Victim of: No.,  N/A    Report needed: No. Victim of Neglect:No. Perpetrator of  N/A   Witness / Exposure to Domestic Violence: No    Protective Services Involvement: No  Witness to [employer] Violence:  No   Family History:  Family History  Problem Relation Age of Onset   Coronary artery disease Father 14   Bladder Cancer Father    Sickle cell anemia Mother    Asthma Mother        smoker   Prostate cancer Paternal Grandfather 12       deceased from prostate CA   Lung cancer Paternal Grandmother 30       smoker   Breast cancer Paternal Uncle    Stroke Neg Hx    Diabetes Neg Hx    Colon cancer Neg Hx    Colon polyps Neg Hx    Esophageal cancer Neg Hx    Rectal cancer Neg Hx    Stomach cancer Neg Hx     Living situation: the patient lives with their spouse  Sexual Orientation: Straight  Relationship Status: married  Name of spouse / other:[name] If a parent, number of children / ages:2 boys, 25 & 2  Support Systems: family  Financial Stress:  No   Income/Employment/Disability: Employment  Armed forces logistics/support/administrative officer:  unknown  Educational History: Education: post Forensic psychologist work or degree  Religion/Sprituality/World View: unknown  Any cultural differences that may affect / interfere with treatment:  not applicable   Recreation/Hobbies: woodworking  Stressors: Occupational concerns    Strengths: Family  Barriers:  unknown   Legal History: Pending legal issue / charges: The patient has  no significant history of legal issues. History of legal issue / charges:  N/A  Medical History/Surgical History: not reviewed Past Medical History:  Diagnosis Date   Arthritis    Asthma    as a child   Bipolar disorder, unspecified ([place])    hospitalization 2004 for depression, bipolar, ambien overdose   Cancer ([place]) 10/20/2015   skin ca on nose removed   Cholesteatoma 05/2010   s/p R ear surgery, residual tinnitus, planning on L ear surgery   Depression    bipolar   Family history of ischemic heart disease    History of asthma    Migraine without aura, without mention of intractable migraine  without mention of status migrainosus    Rosacea 2017   Spinal stenosis    Unspecified adjustment reaction     Past Surgical History:  Procedure Laterality Date   CARPOMETACARPEL SUSPENSION PLASTY Left 10/18/2017   Procedure: LEFT THUMB TRAPEZIECTOMY AND SUSPENSIONPLASTY;  Surgeon: Leanora Cover, MD;  Location: [place];  Service: Orthopedics;  Laterality: Left;   CARPOMETACARPEL SUSPENSION PLASTY Right 03/28/2018   Procedure: CARPOMETACARPEL [facility]) SUSPENSION PLASTY AND TRAPEZIECTOMY;  Surgeon: Leanora Cover, MD;  Location: [place];  Service: Orthopedics;  Laterality: Right;   COLONOSCOPY  1999   normal (in [place])   ESOPHAGUS SURGERY  2003   torn esophagus   [place], 2012, 2013   for cholesteatoma, bilateral first L then R   NECK SURGERY  2002   spinal stenosis and arthritis   perianal abscess  1998    Medications: Current Outpatient Medications  Medication Sig Dispense Refill   ARIPiprazole (ABILIFY) 2 MG tablet Take 2 mg by mouth at bedtime.     doxycycline (VIBRA-TABS) 100 MG tablet Take 100 mg by mouth 2 (two) times daily. As needed     fluticasone (FLONASE) 50 MCG/ACT nasal spray Place 2 sprays into both nostrils daily. 16 g 0   ibuprofen (ADVIL) 200 MG tablet Take 3 tablets (600 mg total) by mouth 2 (two) times daily as needed for moderate pain.     ketoconazole (NIZORAL) 2 % cream Apply topically 2 (two) times daily. As needed     levocetirizine (XYZAL) 5 MG tablet Take 1 tablet (5 mg total) by mouth every evening. 30 tablet 0   lithium carbonate (LITHOBID) 300 MG CR tablet Take 2 tablets (600 mg total) by mouth 2 (two) times daily.     metroNIDAZOLE (METROGEL) 0.75 % gel SMARTSIG:1 Sparingly Topical Every Night     SULFACLEANSE 8/4 8-4 % SUSP Apply 1 application topically 2 (two) times daily. As needed     triamcinolone ointment (KENALOG) 0.1 % Apply topically 2 (two) times daily. As needed     valACYclovir  (VALTREX) 500 MG tablet TAKE 1 TABLET BY MOUTH TWICE DAILY 3 DAYS AT A TIME AS NEEDED 30 tablet 1   No current facility-administered medications for this visit.    Allergies  Allergen Reactions   Morphine And Related Itching          Risk Evaluation and Mitigation Strategies (REMS)  The medication you are ordering is associated with Risk Evaluation and Mitigation Strategies (REMS) and has Elements to [employer] Use (ETASU).   Please use the link below to the FDA website to assure all ETASU are addressed for this medication.  [email or web address]   Risk Evaluation and Mitigation Strategies (REMS)  The medication you are ordering is associated with Risk Evaluation and Mitigation  Strategies (REMS) and has Elements to [employer] (ETASU).   Please use the link below to the FDA website to assure all ETASU are addressed for this medication.  [email or web address] and normal is between      No data to display          Risk Evaluation and Mitigation Strategies (REMS)  The medication you are ordering is associated with Risk Evaluation and Mitigation Strategies (REMS) and has Elements to [employer] Use (ETASU).   Please use the link below to the FDA website to assure all ETASU are addressed for this medication.  [email or web address] and normal is      No data to display            Goals: Patient is seeking counseling for mood stabilization. Will utilize cognitive strategies to help manage feelings of anxiety and despair.  Monitor medication compliance -Ongoing. Session was virtual (video) at patient request. He is at home and provider in home office. Revised target date is 6-24  Session Notes: Mikolaj says that last week he met with the attorney regarding his mother and an elder care specialist. His brother took their mother's car to "gt it fixed", but he has  not returned it and has not told anyone the status. The lawyer called his brother and his brother was agitated on the phone. The brother gave an update on the car but still has not returned it. The lawyer is putting together an irrevocable trust. We talked about the need for Teal to "stop worrying about his brother's reactions".  He states that there is no movement on the job front. He and his wife had a "big argument over nothing". He feels it was related to having had too much to drink. His wife questions if Laurance needs a med adjustment. She tells him he is not being nice to her at times. Suggested he talk with his prescriber about possible medication changes.                                               Diagnoses:  Bi Polar Disorder  Plan of Care: Outpatient Therapy and psychotropic medication   Marcelina Morel, PhD Time:4:10-5:00p 50 min.

## 2022-06-12 ENCOUNTER — Ambulatory Visit: Payer: BC Managed Care – PPO | Admitting: Psychology

## 2022-06-20 MED ORDER — WEGOVY 0.25 MG/0.5ML ~~LOC~~ SOAJ: 0.2500 mg | SUBCUTANEOUS | 1 refills | Status: DC

## 2022-06-20 NOTE — Addendum Note (Signed)
Addended by: Ria Bush on: 06/20/2022 09:18 AM   Modules accepted: Orders

## 2022-06-20 NOTE — Telephone Encounter (Signed)
Sent wegovy for patient to price out

## 2022-06-26 ENCOUNTER — Ambulatory Visit (INDEPENDENT_AMBULATORY_CARE_PROVIDER_SITE_OTHER): Payer: BC Managed Care – PPO | Admitting: Psychology

## 2022-06-26 DIAGNOSIS — F319 Bipolar disorder, unspecified: Secondary | ICD-10-CM

## 2022-06-26 NOTE — Progress Notes (Signed)
[place] Counselor Initial Adult Exam  Name: Erik Smith Date: 06/26/2022 MRN: [id number] DOB: 1965-12-16 PCP: Ria Bush, MD     Erik Smith participated from home, via video, and consented to treatment. Therapist participated from home office.   Guardian/Payee:  N/A    Paperwork requested: No   Reason for Visit /Presenting Problem: Improve/manage feeling of agitation  Mental Status Exam: Appearance:   Fairly Groomed     Behavior:  Appropriate  Motor:  Normal  Speech/Language:   Normal Rate  Affect:  Appropriate  Mood:  normal  Thought process:  normal  Thought content:    WNL  Sensory/Perceptual disturbances:    WNL  Orientation:  oriented to person, place, and situation  Attention:  Good  Concentration:  Good  Memory:  WNL  Fund of knowledge:   Good  Insight:    Good  Judgment:   Good  Impulse Control:  Good     Reported Symptoms:  Agitation, frustration, worry  Risk Assessment: Danger to Self:  No Self-injurious Behavior: No Danger to Others: No Duty to Warn:no Physical Aggression / Violence:No  Access to Firearms a concern:  unknown Gang Involvement:No  Patient / guardian was educated about steps to take if suicide or homicide risk level increases between visits: n/a While future psychiatric events cannot be accurately predicted, the patient does not currently require acute inpatient psychiatric care and does not currently meet [facility] involuntary commitment criteria.  Substance Abuse History: Current substance abuse: No     Past Psychiatric History:   Previous psychological history is significant for Bipolar Mania Outpatient Providers:Dr. Pervis Hocking History of Psych Hospitalization: Yes  Psychological Testing:  N/A    Abuse History:  Victim of: No.,  N/A    Report needed: No. Victim of Neglect:No. Perpetrator of  N/A   Witness / Exposure to  Domestic Violence: No   Protective Services Involvement: No  Witness to [employer] Violence:  No   Family History:  Family History  Problem Relation Age of Onset   Coronary artery disease Father 16   Bladder Cancer Father    Sickle cell anemia Mother    Asthma Mother        smoker   Prostate cancer Paternal Grandfather 77       deceased from prostate CA   Lung cancer Paternal Grandmother 11       smoker   Breast cancer Paternal Uncle    Stroke Neg Hx    Diabetes Neg Hx    Colon cancer Neg Hx    Colon polyps Neg Hx    Esophageal cancer Neg Hx    Rectal cancer Neg Hx    Stomach cancer Neg Hx     Living situation: the patient lives with their spouse  Sexual Orientation: Straight  Relationship Status: married  Name of spouse / other:Erik Smith If a parent, number of children / ages:2 boys, 29 & 60  Support Systems: family  Financial Stress:  No   Income/Employment/Disability: Employment  Armed forces logistics/support/administrative officer:  unknown  Educational History: Education: post Forensic psychologist work or degree  Religion/Sprituality/World View: unknown  Any cultural differences that may affect / interfere with treatment:  not applicable   Recreation/Hobbies: woodworking  Stressors: Occupational concerns    Strengths: Family  Barriers:  unknown   Legal History: Pending legal issue / charges: The patient has no significant history of legal issues. History of legal issue / charges:  N/A  Medical History/Surgical History: not reviewed Past Medical History:  Diagnosis Date   Arthritis    Asthma    as a child   Bipolar disorder, unspecified ([place])    hospitalization 2004 for depression, bipolar, ambien overdose   Cancer ([place]) 10/20/2015   skin ca on nose removed   Cholesteatoma 05/2010   s/p R ear surgery, residual tinnitus, planning on L ear surgery   Depression    bipolar   Family history of ischemic heart disease    History of asthma    Migraine without aura, without mention of  intractable migraine without mention of status migrainosus    Rosacea 2017   Spinal stenosis    Unspecified adjustment reaction     Past Surgical History:  Procedure Laterality Date   CARPOMETACARPEL SUSPENSION PLASTY Left 10/18/2017   Procedure: LEFT THUMB TRAPEZIECTOMY AND SUSPENSIONPLASTY;  Surgeon: Leanora Cover, MD;  Location: [place];  Service: Orthopedics;  Laterality: Left;   CARPOMETACARPEL SUSPENSION PLASTY Right 03/28/2018   Procedure: CARPOMETACARPEL [facility]) SUSPENSION PLASTY AND TRAPEZIECTOMY;  Surgeon: Leanora Cover, MD;  Location: [place];  Service: Orthopedics;  Laterality: Right;   COLONOSCOPY  1999   normal (in [place])   ESOPHAGUS SURGERY  2003   torn esophagus   [place], 2012, 2013   for cholesteatoma, bilateral first L then R   NECK SURGERY  2002   spinal stenosis and arthritis   perianal abscess  1998    Medications: Current Outpatient Medications  Medication Sig Dispense Refill   ARIPiprazole (ABILIFY) 2 MG tablet Take 2 mg by mouth at bedtime.     doxycycline (VIBRA-TABS) 100 MG tablet Take 100 mg by mouth 2 (two) times daily. As needed     fluticasone (FLONASE) 50 MCG/ACT nasal spray Place 2 sprays into both nostrils daily. 16 g 0   ibuprofen (ADVIL) 200 MG tablet Take 3 tablets (600 mg total) by mouth 2 (two) times daily as needed for moderate pain.     ketoconazole (NIZORAL) 2 % cream Apply topically 2 (two) times daily. As needed     levocetirizine (XYZAL) 5 MG tablet Take 1 tablet (5 mg total) by mouth every evening. 30 tablet 0   lithium carbonate (LITHOBID) 300 MG CR tablet Take 2 tablets (600 mg total) by mouth 2 (two) times daily.     metroNIDAZOLE (METROGEL) 0.75 % gel SMARTSIG:1 Sparingly Topical Every Night     Semaglutide-Weight Management (WEGOVY) 0.25 MG/0.5ML SOAJ Inject 0.25 mg into the skin once a week. 2 mL 1   SULFACLEANSE 8/4 8-4 % SUSP Apply 1 application topically 2 (two) times  daily. As needed     triamcinolone ointment (KENALOG) 0.1 % Apply topically 2 (two) times daily. As needed     valACYclovir (VALTREX) 500 MG tablet TAKE 1 TABLET BY MOUTH TWICE DAILY 3 DAYS AT A TIME AS NEEDED 30 tablet 1   No current facility-administered medications for this visit.    Allergies  Allergen Reactions   Morphine And Related Itching          Risk Evaluation and Mitigation Strategies (REMS)  The medication you are ordering is associated with Risk Evaluation and Mitigation Strategies (REMS) and has Elements to [employer] Use (ETASU).   Please use the link below to  the FDA website to assure all ETASU are addressed for this medication.  [email or web address]   Risk Evaluation and Mitigation Strategies (REMS)  The medication you are ordering is associated with Risk Evaluation and Mitigation Strategies (REMS) and has Elements to [employer] Use (ETASU).   Please use the link below to the FDA website to assure all ETASU are addressed for this medication.  [email or web address] and normal is between      No data to display          Risk Evaluation and Mitigation Strategies (REMS)  The medication you are ordering is associated with Risk Evaluation and Mitigation Strategies (REMS) and has Elements to [employer] Use (ETASU).   Please use the link below to the FDA website to assure all ETASU are addressed for this medication.  [email or web address] and normal is      No data to display            Goals: Patient is seeking counseling for mood stabilization. Will utilize cognitive strategies to help manage feelings of anxiety and despair.  Monitor medication compliance -Ongoing. Session was virtual (video) at patient request. He is at home and provider in home office. Revised target date is 6-24  Session Notes: Erik Smith says that he and his wife have been  going to their camper with some regularity. Easier to get to work from there than from home. 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 Told him first to talk with his psychiatrist to see where he is sending his patients. I will also provide some names ([facility]). His job search has stalled but he is still applying with the hopes of getting some interviews. He is very happy that his son made the tennis team. States he has been stable without any significant issues. He has had some logistical challenges with things breaking and causing extra burdens, but has managed his emotions and not gotten upset.  Issues with his mother and her estate is coming to a resolution. He is getting the funds protected from his brother and using his uncle as power of attorney. This has been a big relief.                                                  Diagnoses:  Bi Polar Disorder  Plan of Care: Outpatient Therapy and psychotropic medication   Marcelina Morel, PhD Time:4:10-5:00p 50 min.

## 2022-06-27 ENCOUNTER — Telehealth: Payer: Self-pay

## 2022-06-27 NOTE — Telephone Encounter (Signed)
Plz submit PA for Wegovy 0.25 mg.

## 2022-06-27 NOTE — Telephone Encounter (Signed)
Sent PA request to PA Team for Wegovy 0.25 mg. (See 06/27/22 phn note.)

## 2022-06-28 ENCOUNTER — Other Ambulatory Visit (HOSPITAL_COMMUNITY): Payer: Self-pay

## 2022-06-30 ENCOUNTER — Other Ambulatory Visit (HOSPITAL_COMMUNITY): Payer: Self-pay

## 2022-06-30 NOTE — Telephone Encounter (Signed)
Patient returned call,I relayed Dr Darnell Level message to him,he will be calling his insurance company ,and giving [place] a call back

## 2022-06-30 NOTE — Telephone Encounter (Addendum)
Please notify pt that it seems his insurance isn't covering [phone] as cost to him would be 1200$.  Recommend he call his insurance and speak to someone to see if they will cover this medication and let us know what he finds out.

## 2022-06-30 NOTE — Telephone Encounter (Signed)
Noted  

## 2022-06-30 NOTE — Telephone Encounter (Signed)
Left message to return call to our office.  

## 2022-06-30 NOTE — Telephone Encounter (Signed)
Submitted PA for Wegovy on CMM ([id number]) . Came back PA has been resolved, no additional PA required. Ran test claim, received paid claim but copay is $1,288.68

## 2022-07-10 ENCOUNTER — Ambulatory Visit (INDEPENDENT_AMBULATORY_CARE_PROVIDER_SITE_OTHER): Payer: BC Managed Care – PPO | Admitting: Psychology

## 2022-07-10 DIAGNOSIS — F319 Bipolar disorder, unspecified: Secondary | ICD-10-CM | POA: Diagnosis not present

## 2022-07-10 NOTE — Progress Notes (Signed)
Name: Erik Smith Date: 07/10/2022 MRN: [id number] DOB: 17-Apr-1966 PCP: Ria Bush, MD     Erik Smith participated from home, via video, and consented to treatment. Therapist participated from home office.   Guardian/Payee:  N/A    Paperwork requested: No   Reason for Visit /Presenting Problem: Improve/manage feeling of agitation  Mental Status Exam: Appearance:   Fairly Groomed     Behavior:  Appropriate  Motor:  Normal  Speech/Language:   Normal Rate  Affect:  Appropriate  Mood:  normal  Thought process:  normal  Thought content:    WNL  Sensory/Perceptual disturbances:    WNL  Orientation:  oriented to person, place, and situation  Attention:  Good  Concentration:  Good  Memory:  WNL  Fund of knowledge:   Good  Insight:    Good  Judgment:   Good  Impulse Control:  Good     Reported Symptoms:  Agitation, frustration, worry  Risk Assessment: Danger to Self:  No Self-injurious Behavior: No Danger to Others: No Duty to Warn:no Physical Aggression / Violence:No  Access to Firearms a concern:  unknown Gang Involvement:No  Patient / guardian was educated about steps to take if suicide or homicide risk level increases between visits: n/a While future psychiatric events cannot be accurately predicted, the patient does not currently require acute inpatient psychiatric care and does not currently meet [facility] involuntary commitment criteria.  Substance Abuse History: Current substance abuse: No     Past Psychiatric History:   Previous psychological history is significant for Bipolar Mania Outpatient Providers:Dr. Pervis Hocking History of Psych Hospitalization: Yes  Psychological Testing:  N/A    Abuse History:  Victim of: No.,  N/A    Report needed: No. Victim of Neglect:No. Perpetrator of  N/A   Witness / Exposure to Domestic Violence: No   Protective Services Involvement: No  Witness to [employer] Violence:  No   Family History:  Family  History  Problem Relation Age of Onset   Coronary artery disease Father 84   Bladder Cancer Father    Sickle cell anemia Mother    Asthma Mother        smoker   Prostate cancer Paternal Grandfather 75       deceased from prostate CA   Lung cancer Paternal Grandmother 79       smoker   Breast cancer Paternal Uncle    Stroke Neg Hx    Diabetes Neg Hx    Colon cancer Neg Hx    Colon polyps Neg Hx    Esophageal cancer Neg Hx    Rectal cancer Neg Hx    Stomach cancer Neg Hx     Living situation: the patient lives with their spouse  Sexual Orientation: Straight  Relationship Status: married  Name of spouse / other:Erik Smith If a parent, number of children / ages:2 boys, 44 & 69  Support Systems: family  Financial Stress:  No   Income/Employment/Disability: Employment  Armed forces logistics/support/administrative officer:  unknown  Educational History: Education: post Forensic psychologist work or degree  Religion/Sprituality/World View: unknown  Any cultural differences that may affect / interfere with treatment:  not applicable   Recreation/Hobbies: woodworking  Stressors: Occupational concerns    Strengths: Family  Barriers:  unknown   Legal History: Pending legal issue / charges: The patient has no significant history of legal issues. History of legal issue / charges:  N/A  Medical History/Surgical History: not reviewed Past Medical History:  Diagnosis Date   Arthritis  Asthma    as a child   Bipolar disorder, unspecified ([place])    hospitalization 2004 for depression, bipolar, ambien overdose   Cancer ([place]) 10/20/2015   skin ca on nose removed   Cholesteatoma 05/2010   s/p R ear surgery, residual tinnitus, planning on L ear surgery   Depression    bipolar   Family history of ischemic heart disease    History of asthma    Migraine without aura, without mention of intractable migraine without mention of status migrainosus    Rosacea 2017   Spinal stenosis    Unspecified adjustment reaction      Past Surgical History:  Procedure Laterality Date   CARPOMETACARPEL SUSPENSION PLASTY Left 10/18/2017   Procedure: LEFT THUMB TRAPEZIECTOMY AND SUSPENSIONPLASTY;  Surgeon: Leanora Cover, MD;  Location: [place];  Service: Orthopedics;  Laterality: Left;   CARPOMETACARPEL SUSPENSION PLASTY Right 03/28/2018   Procedure: CAR[facility]OM[facility]ACARPEL [facility]) SUSPENSION PLASTY AND TRAPEZIECTOMY;  Surgeon: Leanora Cover, MD;  Location: [place];  Service: Orthopedics;  Laterality: Right;   COLONOSCOPY  1999   normal (in [place])   ESOPHAGUS SURGERY  2003   torn esophagus   [place], 2012, 2013   for cholesteatoma, bilateral first L then R   NECK SURGERY  2002   spinal stenosis and arthritis   perianal abscess  1998    Medications: Current Outpatient Medications  Medication Sig Dispense Refill   ARIPiprazole (ABILIFY) 2 MG tablet Take 2 mg by mouth at bedtime.     doxycycline (VIBRA-TABS) 100 MG tablet Take 100 mg by mouth 2 (two) times daily. As needed     fluticasone (FLONASE) 50 MCG/ACT nasal spray Place 2 sprays into both nostrils daily. 16 g 0   ibuprofen (ADVIL) 200 MG tablet Take 3 tablets (600 mg total) by mouth 2 (two) times daily as needed for moderate pain.     ketoconazole (NIZORAL) 2 % cream Apply topically 2 (two) times daily. As needed     levocetirizine (XYZAL) 5 MG tablet Take 1 tablet (5 mg total) by mouth every evening. 30 tablet 0   lithium carbonate (LITHOBID) 300 MG CR tablet Take 2 tablets (600 mg total) by mouth 2 (two) times daily.     metroNIDAZOLE (METROGEL) 0.75 % gel SMARTSIG:1 Sparingly Topical Every Night     Semaglutide-Weight Management (WEGOVY) 0.25 MG/0.5ML SOAJ Inject 0.25 mg into the skin once a week. 2 mL 1   SULFACLEANSE 8/4 8-4 % SUSP Apply 1 application topically 2 (two) times daily. As needed     triamcinolone ointment (KENALOG) 0.1 % Apply topically 2 (two) times daily. As needed     valACYclovir  (VALTREX) 500 MG tablet TAKE 1 TABLET BY MOUTH TWICE DAILY 3 DAYS AT A TIME AS NEEDED 30 tablet 1   No current facility-administered medications for this visit.    Allergies  Allergen Reactions   Morphine And Related Itching          Risk Evaluation and Mitigation Strategies (REMS)  The medication you are ordering is associated with Risk Evaluation and Mitigation Strategies (REMS) and has Elements to [employer] Use (ETASU).   Please use the link below to the FDA website to assure all ETASU are addressed for this medication.  [email or web address]   Risk Evaluation and Mitigation Strategies (REMS)  The medication you are ordering is associated with Risk Evaluation and Mitigation Strategies (REMS) and has Elements to [employer] Use (ETASU).  Please use the link below to the FDA website to assure all ETASU are addressed for this medication.  [email or web address] and normal is between      No data to display          Risk Evaluation and Mitigation Strategies (REMS)  The medication you are ordering is associated with Risk Evaluation and Mitigation Strategies (REMS) and has Elements to [employer] Use (ETASU).   Please use the link below to the FDA website to assure all ETASU are addressed for this medication.  [email or web address] and normal is      No data to display            Goals: Patient is seeking counseling for mood stabilization. Will utilize cognitive strategies to help manage feelings of anxiety and despair.  Monitor medication compliance -Ongoing. Session was virtual (video) at patient request. He is at home and provider in home office. Revised target date is 6-24  Session Notes: Averi says he is having significant medical issues. He found out he had pneumonia and Covid 19. Then, while doing some work around the house, he slipped on ladder and  hyperextended his knee. Diagnosed as bad hamstring pull and is on crutches. Wife got Covid 36 as well in addition to a significat allergic reaction. He has now missed several days of work. He reached out to his psychiatrist and got information about getting a referral for a new psychiatrist. He and wife are scheduled for for a cruise at the end of the month. He also has a trip planned with his son to see [place] training for baseball. He says that he is still looking for jobs, but no interviews since our last meeting.                                                   Diagnoses:  Bi Polar Disorder  Plan of Care: Outpatient Therapy and psychotropic medication   Marcelina Morel, PhD Time:4:10-5:00p 50 min.

## 2022-07-24 ENCOUNTER — Ambulatory Visit (INDEPENDENT_AMBULATORY_CARE_PROVIDER_SITE_OTHER): Payer: BC Managed Care – PPO | Admitting: Psychology

## 2022-07-24 DIAGNOSIS — F319 Bipolar disorder, unspecified: Secondary | ICD-10-CM | POA: Diagnosis not present

## 2022-07-24 NOTE — Progress Notes (Signed)
Name: Erik Smith Date: 07/24/2022 MRN: [id number] DOB: 1966-03-15 PCP: Erik Bush, MD     Erik Smith participated from home, via video, and consented to treatment. Therapist participated from home office.   Guardian/Payee:  N/A    Paperwork requested: No   Reason for Visit /Presenting Problem: Improve/manage feeling of agitation  Mental Status Exam: Appearance:   Fairly Groomed     Behavior:  Appropriate  Motor:  Normal  Speech/Language:   Normal Rate  Affect:  Appropriate  Mood:  normal  Thought process:  normal  Thought content:    WNL  Sensory/Perceptual disturbances:    WNL  Orientation:  oriented to person, place, and situation  Attention:  Good  Concentration:  Good  Memory:  WNL  Fund of knowledge:   Good  Insight:    Good  Judgment:   Good  Impulse Control:  Good     Reported Symptoms:  Agitation, frustration, worry  Risk Assessment: Danger to Self:  No Self-injurious Behavior: No Danger to Others: No Duty to Warn:no Physical Aggression / Violence:No  Access to Firearms a concern:  unknown Gang Involvement:No  Patient / guardian was educated about steps to take if suicide or homicide risk level increases between visits: n/a While future psychiatric events cannot be accurately predicted, the patient does not currently require acute inpatient psychiatric care and does not currently meet [facility] involuntary commitment criteria.  Substance Abuse History: Current substance abuse: No     Past Psychiatric History:   Previous psychological history is significant for Bipolar Mania Outpatient Providers:Erik Smith History of Psych Hospitalization: Yes  Psychological Testing:  N/A    Abuse History:  Victim of: No.,  N/A    Report needed: No. Victim of Neglect:No. Perpetrator of  N/A   Witness / Exposure to Domestic Violence: No   Protective Services Involvement: No  Witness to [employer] Violence:  No    Family History:  Family History  Problem Relation Age of Onset   Coronary artery disease Father 65   Bladder Cancer Father    Sickle cell anemia Mother    Asthma Mother        smoker   Prostate cancer Paternal Grandfather 34       deceased from prostate CA   Lung cancer Paternal Grandmother 76       smoker   Breast cancer Paternal Uncle    Stroke Neg Hx    Diabetes Neg Hx    Colon cancer Neg Hx    Colon polyps Neg Hx    Esophageal cancer Neg Hx    Rectal cancer Neg Hx    Stomach cancer Neg Hx     Living situation: the patient lives with their spouse  Sexual Orientation: Straight  Relationship Status: married  Name of spouse / other:Erik Smith If a parent, number of children / ages:2 boys, 83 & 23  Support Systems: family  Financial Stress:  No   Income/Employment/Disability: Employment  Armed forces logistics/support/administrative officer:  unknown  Educational History: Education: post Forensic psychologist work or degree  Religion/Sprituality/World View: unknown  Any cultural differences that may affect / interfere with treatment:  not applicable   Recreation/Hobbies: woodworking  Stressors: Occupational concerns    Strengths: Family  Barriers:  unknown   Legal History: Pending legal issue / charges: The patient has no significant history of legal issues. History of legal issue / charges:  N/A  Medical History/Surgical History: not reviewed Past Medical History:  Diagnosis Date   Arthritis    Asthma    as a child   Bipolar disorder, unspecified ([place])    hospitalization 2004 for depression, bipolar, ambien overdose   Cancer ([place]) 10/20/2015   skin ca on nose removed   Cholesteatoma 05/2010   s/p R ear surgery, residual tinnitus, planning on L ear surgery   Depression    bipolar   Family history of ischemic heart disease    History of asthma    Migraine without aura, without mention of intractable migraine without mention of status migrainosus    Rosacea 2017   Spinal stenosis     Unspecified adjustment reaction     Past Surgical History:  Procedure Laterality Date   CARPOMETACARPEL SUSPENSION PLASTY Left 10/18/2017   Procedure: LEFT THUMB TRAPEZIECTOMY AND SUSPENSIONPLASTY;  Surgeon: Erik Cover, MD;  Location: [facility];  Service: Orthopedics;  Laterality: Left;   CARPOMETACARPEL SUSPENSION PLASTY Right 03/28/2018   Procedure: CARPOMETACARPEL [facility]) SUSPENSION PLASTY AND TRAPEZIECTOMY;  Surgeon: Erik Cover, MD;  Location: [facility];  Service: Orthopedics;  Laterality: Right;   COLONOSCOPY  1999   normal (in [place])   ESOPHAGUS SURGERY  2003   torn esophagus   [place], 2012, 2013   for cholesteatoma, bilateral first L then R   NECK SURGERY  2002   spinal stenosis and arthritis   perianal abscess  1998    Medications: Current Outpatient Medications  Medication Sig Dispense Refill   ARIPiprazole (ABILIFY) 2 MG tablet Take 2 mg by mouth at bedtime.     doxycycline (VIBRA-TABS) 100 MG tablet Take 100 mg by mouth 2 (two) times daily. As needed     fluticasone (FLONASE) 50 MCG/ACT nasal spray Place 2 sprays into both nostrils daily. 16 g 0   ibuprofen (ADVIL) 200 MG tablet Take 3 tablets (600 mg total) by mouth 2 (two) times daily as needed for moderate pain.     ketoconazole (NIZORAL) 2 % cream Apply topically 2 (two) times daily. As needed     levocetirizine (XYZAL) 5 MG tablet Take 1 tablet (5 mg total) by mouth every evening. 30 tablet 0   lithium carbonate (LITHOBID) 300 MG CR tablet Take 2 tablets (600 mg total) by mouth 2 (two) times daily.     metroNIDAZOLE (METROGEL) 0.75 % gel SMARTSIG:1 Sparingly Topical Every Night     Semaglutide-Weight Management (WEGOVY) 0.25 MG/0.5ML SOAJ Inject 0.25 mg into the skin once a week. 2 mL 1   SULFACLEANSE 8/4 8-4 % SUSP Apply 1 application topically 2 (two) times daily. As needed     triamcinolone ointment (KENALOG) 0.1 % Apply topically 2 (two) times daily.  As needed     valACYclovir (VALTREX) 500 MG tablet TAKE 1 TABLET BY MOUTH TWICE DAILY 3 DAYS AT A TIME AS NEEDED 30 tablet 1   No current facility-administered medications for this visit.    Allergies  Allergen Reactions   Morphine And Related Itching          Risk Evaluation and Mitigation Strategies (REMS)  The medication you are ordering is associated with Risk Evaluation and Mitigation Strategies (REMS) and has Elements to [employer] Use (ETASU).   Please use the link below to the FDA website to assure all ETASU are addressed for this medication.  [email or web address]   Risk Evaluation and Mitigation Strategies (REMS)  The medication you are ordering is associated  with Risk Evaluation and Mitigation Strategies (REMS) and has Elements to [employer] (ETASU).   Please use the link below to the FDA website to assure all ETASU are addressed for this medication.  [email or web address] and normal is between      No data to display          Risk Evaluation and Mitigation Strategies (REMS)  The medication you are ordering is associated with Risk Evaluation and Mitigation Strategies (REMS) and has Elements to [employer] Use (ETASU).   Please use the link below to the FDA website to assure all ETASU are addressed for this medication.  [email or web address] and normal is      No data to display            Goals: Patient is seeking counseling for mood stabilization. Will utilize cognitive strategies to help manage feelings of anxiety and despair.  Monitor medication compliance -Ongoing. Session was virtual (video) at patient request. He is at home and provider in home office. Revised target date is 6-24  Session Notes: Erik Smith says it was a "long weekend". He is frustrated with his father who is being hostile toward him for his political views. Erik Smith stood  up to him and declared that he should not be treated this way. This is frustrating for him and reflects the struggles they have had for many years.  He remains frustrated with the job search and has few leads. Just put in 4 additional applications. This is the time of year when he expects more opportunities to emerge, Trying to remain positive. He does have an interview scheduled for March. Moods reasonably stable.                                                      Diagnoses:  Bi Polar Disorder  Plan of Care: Outpatient Therapy and psychotropic medication   Marcelina Morel, PhD Time:4:10-5:00p 50 min.

## 2022-08-07 ENCOUNTER — Ambulatory Visit (INDEPENDENT_AMBULATORY_CARE_PROVIDER_SITE_OTHER): Payer: BC Managed Care – PPO | Admitting: Psychology

## 2022-08-07 DIAGNOSIS — F319 Bipolar disorder, unspecified: Secondary | ICD-10-CM

## 2022-08-07 NOTE — Progress Notes (Signed)
Name: Erik Smith Date: 08/07/2022 MRN: [id number] DOB: 1965/10/05 PCP: Ria Bush, MD     Erik Smith participated from home, via video, and consented to treatment. Therapist participated from home office.   Guardian/Payee:  N/A    Paperwork requested: No   Reason for Visit /Presenting Problem: Improve/manage feeling of agitation  Mental Status Exam: Appearance:   Fairly Groomed     Behavior:  Appropriate  Motor:  Normal  Speech/Language:   Normal Rate  Affect:  Appropriate  Mood:  normal  Thought process:  normal  Thought content:    WNL  Sensory/Perceptual disturbances:    WNL  Orientation:  oriented to person, place, and situation  Attention:  Good  Concentration:  Good  Memory:  WNL  Fund of knowledge:   Good  Insight:    Good  Judgment:   Good  Impulse Control:  Good     Reported Symptoms:  Agitation, frustration, worry  Risk Assessment: Danger to Self:  No Self-injurious Behavior: No Danger to Others: No Duty to Warn:no Physical Aggression / Violence:No  Access to Firearms a concern:  unknown Gang Involvement:No  Patient / guardian was educated about steps to take if suicide or homicide risk level increases between visits: n/a While future psychiatric events cannot be accurately predicted, the patient does not currently require acute inpatient psychiatric care and does not currently meet [facility] involuntary commitment criteria.  Substance Abuse History: Current substance abuse: No     Past Psychiatric History:   Previous psychological history is significant for Bipolar Mania Outpatient Providers:Dr. Pervis Hocking History of Psych Hospitalization: Yes  Psychological Testing:  N/A    Abuse History:  Victim of: No.,  N/A    Report needed: No. Victim of Neglect:No. Perpetrator of  N/A   Witness / Exposure to Domestic Violence: No   Protective Services Involvement: No  Witness  to [employer] Violence:  No   Family History:  Family History  Problem Relation Age of Onset   Coronary artery disease Father 79   Bladder Cancer Father    Sickle cell anemia Mother    Asthma Mother        smoker   Prostate cancer Paternal Grandfather 79       deceased from prostate CA   Lung cancer Paternal Grandmother 58       smoker   Breast cancer Paternal Uncle    Stroke Neg Hx    Diabetes Neg Hx    Colon cancer Neg Hx    Colon polyps Neg Hx    Esophageal cancer Neg Hx    Rectal cancer Neg Hx    Stomach cancer Neg Hx     Living situation: the patient lives with their spouse  Sexual Orientation: Straight  Relationship Status: married  Name of spouse / other:[name] If a parent, number of children / ages:2 boys, 15 & 52  Support Systems: family  Financial Stress:  No   Income/Employment/Disability: Employment  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 History of legal issue / charges:  N/A  Medical History/Surgical History: not reviewed Past Medical History:  Diagnosis Date   Arthritis    Asthma    as a child   Bipolar disorder, unspecified ([place])    hospitalization 2004 for depression, bipolar, ambien overdose   Cancer ([place]) 10/20/2015   skin ca on nose removed   Cholesteatoma 05/2010   s/p R ear surgery, residual tinnitus, planning on L ear surgery   Depression    bipolar   Family history of ischemic heart disease    History of asthma    Migraine without aura, without mention of intractable migraine without mention of status migrainosus    Rosacea  2017   Spinal stenosis    Unspecified adjustment reaction     Past Surgical History:  Procedure Laterality Date   CARPOMETACARPEL SUSPENSION PLASTY Left 10/18/2017   Procedure: LEFT THUMB TRAPEZIECTOMY AND SUSPENSIONPLASTY;  Surgeon: Leanora Cover, MD;  Location: [place];  Service: Orthopedics;  Laterality: Left;   CARPOMETACARPEL SUSPENSION PLASTY Right 03/28/2018   Procedure: CARPOMETACARPEL [facility]) SUSPENSION PLASTY AND TRAPEZIECTOMY;  Surgeon: Leanora Cover, MD;  Location: [place];  Service: Orthopedics;  Laterality: Right;   COLONOSCOPY  1999   normal (in [place])   ESOPHAGUS SURGERY  2003   torn esophagus   [place], 2012, 2013   for cholesteatoma, bilateral first L then R   NECK SURGERY  2002   spinal stenosis and arthritis   perianal abscess  1998    Medications: Current Outpatient Medications  Medication Sig Dispense Refill   ARIPiprazole (ABILIFY) 2 MG tablet Take 2 mg by mouth at bedtime.     doxycycline (VIBRA-TABS) 100 MG tablet Take 100 mg by mouth 2 (two) times daily. As needed     fluticasone (FLONASE) 50 MCG/ACT nasal spray Place 2 sprays into both nostrils daily. 16 g 0   ibuprofen (ADVIL) 200 MG tablet Take 3 tablets (600 mg total) by mouth 2 (two) times daily as needed for moderate pain.     ketoconazole (NIZORAL) 2 % cream Apply topically 2 (two) times daily. As needed     levocetirizine (XYZAL) 5 MG tablet Take 1 tablet (5 mg total) by mouth every evening. 30 tablet 0   lithium carbonate (LITHOBID) 300 MG CR tablet Take 2 tablets (600 mg total) by mouth 2 (two) times daily.     metroNIDAZOLE (METROGEL) 0.75 % gel SMARTSIG:1 Sparingly Topical Every Night     Semaglutide-Weight Management (WEGOVY) 0.25 MG/0.5ML SOAJ Inject 0.25 mg into the skin once a week. 2 mL 1   SULFACLEANSE 8/4 8-4 % SUSP Apply 1 application topically 2 (two) times daily. As needed     triamcinolone ointment (KENALOG) 0.1 % Apply  topically 2 (two) times daily. As needed     valACYclovir (VALTREX) 500 MG tablet TAKE 1 TABLET BY MOUTH TWICE DAILY 3 DAYS AT A TIME AS NEEDED 30 tablet 1   No current facility-administered medications for this visit.    Allergies  Allergen Reactions   Morphine And Related Itching          Risk Evaluation and Mitigation Strategies (REMS)  The medication you are ordering is associated with Risk Evaluation and Mitigation Strategies (REMS) and has Elements to [employer] Use (ETASU).   Please use the link below to the FDA website to assure all ETASU are addressed for this medication.  [email or web address]  Risk Evaluation and Mitigation Strategies (REMS)  The medication you are ordering is associated with Risk Evaluation and Mitigation Strategies (REMS) and has Elements to [employer] Use (ETASU).   Please use the link below to the FDA website to assure all ETASU are addressed for this medication.  [email or web address] and normal is between      No data to display          Risk Evaluation and Mitigation Strategies (REMS)  The medication you are ordering is associated with Risk Evaluation and Mitigation Strategies (REMS) and has Elements to [employer] Use (ETASU).   Please use the link below to the FDA website to assure all ETASU are addressed for this medication.  [email or web address] and normal is      No data to display            Goals: Patient is seeking counseling for mood stabilization. Will utilize cognitive strategies to help manage feelings of anxiety and despair.  Monitor medication compliance -Ongoing. Session was virtual (video) at patient request. He is at home and provider in home office. Revised target date is 6-24  Session Notes: Chevez says he is again bothered by a bad cough, likely due to allergy. [employer] bak last night from a [place] cruise. Drove back from [facility] yesterday. Says that the best part of the vacation was that they "did not argue". They have different ideas about how to enjoy their time, but were able to compromise. He is going to be moving mother in law to a one bedroom condo at [place] [employer]. This, in his opinion, is a far better situation for all. He claims moods have been steady with little fluctuation. Has not had any depressive episodes which he hopes to sustain now that he is back from vacation.                                                            Diagnoses:  Bi Polar Disorder  Plan of Care: Outpatient Therapy and psychotropic medication   Marcelina Morel, PhD Time:4:10-5:00p 50 min.

## 2022-08-21 ENCOUNTER — Ambulatory Visit (INDEPENDENT_AMBULATORY_CARE_PROVIDER_SITE_OTHER): Payer: BC Managed Care – PPO | Admitting: Psychology

## 2022-08-21 DIAGNOSIS — F319 Bipolar disorder, unspecified: Secondary | ICD-10-CM | POA: Diagnosis not present

## 2022-08-21 NOTE — Progress Notes (Signed)
Name: Erik Smith Date: 08/21/2022 MRN: [id number] DOB: [birth year] PCP: [name], MD     Erik Smith participated from home, via video, and consented to treatment. Therapist participated from home office.   Guardian/Payee:  N/A    Paperwork requested: No   Reason for Visit /Presenting Problem: Improve/manage feeling of agitation  Mental Status Exam: Appearance:   Fairly Groomed     Behavior:  Appropriate  Motor:  Normal  Speech/Language:   Normal Rate  Affect:  Appropriate  Mood:  normal  Thought process:  normal  Thought content:    WNL  Sensory/Perceptual disturbances:    WNL  Orientation:  oriented to person, place, and situation  Attention:  Good  Concentration:  Good  Memory:  WNL  Fund of knowledge:   Good  Insight:    Good  Judgment:   Good  Impulse Control:  Good     Reported Symptoms:  Agitation, frustration, worry  Risk Assessment: Danger to Self:  No Self-injurious Behavior: No Danger to Others: No Duty to Warn:no Physical Aggression / Violence:No  Access to Firearms a concern:  unknown Gang Involvement:No  Patient / guardian was educated about steps to take if suicide or homicide risk level increases between visits: n/a While future psychiatric events cannot be accurately predicted, the patient does not currently require acute inpatient psychiatric care and does not currently meet [facility] involuntary commitment criteria.  Substance Abuse History: Current substance abuse: No     Past Psychiatric History:   Previous psychological history is significant for Bipolar Mania Outpatient Providers:Dr. Evalina Field History of Psych Hospitalization: Yes  Psychological Testing:  N/A    Abuse History:  Victim of: No.,  N/A    Report needed: No. Victim of Neglect:No. Perpetrator of  N/A   Witness / Exposure to Domestic Violence: No   Protective  Services Involvement: No  Witness to [employer] Violence:  No   Family History:  Family History  Problem Relation Age of Onset   Coronary artery disease Father 44   Bladder Cancer Father    Sickle cell anemia Mother    Asthma Mother        smoker   Prostate cancer Paternal Grandfather 29       deceased from prostate CA   Lung cancer Paternal Grandmother 52       smoker   Breast cancer Paternal Uncle    Stroke Neg Hx    Diabetes Neg Hx    Colon cancer Neg Hx    Colon polyps Neg Hx    Esophageal cancer Neg Hx    Rectal cancer Neg Hx    Stomach cancer Neg Hx     Living situation: the patient lives with their spouse  Sexual Orientation: Straight  Relationship Status: married  Name of spouse / other:Erik Smith If a parent, number of children / ages:2 boys, 76 & 15  Support Systems: family  Financial Stress:  No   Income/Employment/Disability: Employment  Financial planner:  unknown  Educational History: Education: post Engineer, maintenance (IT) work or degree  Religion/Sprituality/World View: unknown  Any cultural differences that may affect / interfere with treatment:  not applicable   Recreation/Hobbies: woodworking  Stressors: Occupational concerns    Strengths: Family  Barriers:  unknown   Legal History: Pending legal issue / charges: The patient has no significant history of legal issues. History of legal issue / charges:  N/A  Medical History/Surgical History: not reviewed Past Medical History:  Diagnosis Date   Arthritis    Asthma    as a child   Bipolar disorder, unspecified ([facility])    hospitalization 2004 for depression, bipolar, ambien overdose   Cancer ([facility]) 10/20/2015   skin ca on nose removed   Cholesteatoma 05/2010   s/p R ear surgery, residual tinnitus, planning on L ear surgery   Depression    bipolar   Family history of ischemic heart disease    History of asthma    Migraine without aura, without mention of intractable migraine without mention of  status migrainosus    Rosacea 2017   Spinal stenosis    Unspecified adjustment reaction     Past Surgical History:  Procedure Laterality Date   CARPOMETACARPEL SUSPENSION PLASTY Left 10/18/2017   Procedure: LEFT THUMB TRAPEZIECTOMY AND SUSPENSIONPLASTY;  Surgeon: [name], MD;  Location: [facility];  Service: Orthopedics;  Laterality: Left;   CARPOMETACARPEL SUSPENSION PLASTY Right 03/28/2018   Procedure: CARPOMETACARPEL [facility]) SUSPENSION PLASTY AND TRAPEZIECTOMY;  Surgeon: [name], MD;  Location: [facility];  Service: Orthopedics;  Laterality: Right;   COLONOSCOPY  1999   normal (in [place])   ESOPHAGUS SURGERY  2003   torn esophagus   INNER EAR SURGERY  1975, 1995, 2012, 2013   for cholesteatoma, bilateral first L then R   NECK SURGERY  2002   spinal stenosis and arthritis   perianal abscess  1998    Medications: Current Outpatient Medications  Medication Sig Dispense Refill   ARIPiprazole (ABILIFY) 2 MG tablet Take 2 mg by mouth at bedtime.     doxycycline (VIBRA-TABS) 100 MG tablet Take 100 mg by mouth 2 (two) times daily. As needed     fluticasone (FLONASE) 50 MCG/ACT nasal spray Place 2 sprays into both nostrils daily. 16 g 0   ibuprofen (ADVIL) 200 MG tablet Take 3 tablets (600 mg total) by mouth 2 (two) times daily as needed for moderate pain.     ketoconazole (NIZORAL) 2 % cream Apply topically 2 (two) times daily. As needed     levocetirizine (XYZAL) 5 MG tablet Take 1 tablet (5 mg total) by mouth every evening. 30 tablet 0   lithium carbonate (LITHOBID) 300 MG CR tablet Take 2 tablets (600 mg total) by mouth 2 (two) times daily.     metroNIDAZOLE (METROGEL) 0.75 % gel SMARTSIG:1 Sparingly Topical Every Night     Semaglutide-Weight Management (WEGOVY) 0.25 MG/0.5ML SOAJ Inject 0.25 mg into the skin once a week. 2 mL 1   SULFACLEANSE 8/4 8-4 % SUSP Apply 1 application topically 2 (two) times daily. As needed     triamcinolone  ointment (KENALOG) 0.1 % Apply topically 2 (two) times daily. As needed     valACYclovir (VALTREX) 500 MG tablet TAKE 1 TABLET BY MOUTH TWICE DAILY 3 DAYS AT A TIME AS NEEDED 30 tablet 1   No current facility-administered medications for this visit.    Allergies  Allergen Reactions   Morphine And Related Itching          Risk Evaluation and Mitigation Strategies (REMS)  The medication you are ordering is associated with Risk Evaluation and Mitigation Strategies (REMS) and has Elements to [employer] Use (ETASU).   Please use the link below to  the FDA website to assure all ETASU are addressed for this medication.  [email or web address]   Risk Evaluation and Mitigation Strategies (REMS)  The medication you are ordering is associated with Risk Evaluation and Mitigation Strategies (REMS) and has Elements to [employer] Use (ETASU).   Please use the link below to the FDA website to assure all ETASU are addressed for this medication.  [email or web address] and normal is between      No data to display          Risk Evaluation and Mitigation Strategies (REMS)  The medication you are ordering is associated with Risk Evaluation and Mitigation Strategies (REMS) and has Elements to [employer] Use (ETASU).   Please use the link below to the FDA website to assure all ETASU are addressed for this medication.  [email or web address] and normal is      No data to display            Goals: Patient is seeking counseling for mood stabilization. Will utilize cognitive strategies to help manage feelings of anxiety and despair.  Monitor medication compliance -Ongoing. Session was virtual (video) at patient request. He is at home and provider in home office. Revised target date is 6-24  Patient requested a video (WebEx) meeting. He is at home and provider is in home  office.  Session Notes: Magnum talked about his father that is very critical and (in his own mind) never wrong. He feels their relationship fluctuates. His father disagrees with him and calls him names. His father constantly criticizes him and accuses him of being a terrible person. Deland gets very frustrated with him and gets caught up in arguing with his father. We discussed why and how to avoid these conversations.  He has put forth 4 more applications for AP slots, as none of those he has submitted have yielded any results. He has "good and bad" days. He has brief periods of anger and upset, but is able to recover quickly. 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 His moods are stable and he appears to be maintaining a positive outlook. Compliant with medication.                                                              Diagnoses:  Bi Polar Disorder  Plan of Care: Outpatient Therapy and psychotropic medication   Garrel Ridgel, PhD Time:4:10-5:00p 50 min.

## 2022-09-04 ENCOUNTER — Ambulatory Visit: Payer: BC Managed Care – PPO | Admitting: Psychology

## 2022-09-04 DIAGNOSIS — F319 Bipolar disorder, unspecified: Secondary | ICD-10-CM

## 2022-09-04 NOTE — Progress Notes (Signed)
Name: Erik Smith Date: 09/04/2022 MRN: [id number] DOB: [birth year] PCP: [name], MD     Erik Smith participated from home, via video, and consented to treatment. Therapist participated from home office.   Guardian/Payee:  N/A    Paperwork requested: No   Reason for Visit /Presenting Problem: Improve/manage feeling of agitation  Mental Status Exam: Appearance:   Fairly Groomed     Behavior:  Appropriate  Motor:  Normal  Speech/Language:   Normal Rate  Affect:  Appropriate  Mood:  normal  Thought process:  normal  Thought content:    WNL  Sensory/Perceptual disturbances:    WNL  Orientation:  oriented to person, place, and situation  Attention:  Good  Concentration:  Good  Memory:  WNL  Fund of knowledge:   Good  Insight:    Good  Judgment:   Good  Impulse Control:  Good     Reported Symptoms:  Agitation, frustration, worry  Risk Assessment: Danger to Self:  No Self-injurious Behavior: No Danger to Others: No Duty to Warn:no Physical Aggression / Violence:No  Access to Firearms a concern:  unknown Gang Involvement:No  Patient / guardian was educated about steps to take if suicide or homicide risk level increases between visits: n/a While future psychiatric events cannot be accurately predicted, the patient does not currently require acute inpatient psychiatric care and does not currently meet [facility] involuntary commitment criteria.  Substance Abuse History: Current substance abuse: No     Past Psychiatric History:   Previous psychological history is significant for Bipolar Mania Outpatient Providers:Dr. Evalina Field History of Psych Hospitalization: Yes  Psychological Testing:  N/A    Abuse History:  Victim of: No.,  N/A    Report needed: No. Victim of Neglect:No. Perpetrator of  N/A   Witness / Exposure to Domestic Violence: No   Protective Services Involvement: No  Witness to [employer] Violence:  No   Family History:  Family  History  Problem Relation Age of Onset   Coronary artery disease Father 25   Bladder Cancer Father    Sickle cell anemia Mother    Asthma Mother        smoker   Prostate cancer Paternal Grandfather 29       deceased from prostate CA   Lung cancer Paternal Grandmother 29       smoker   Breast cancer Paternal Uncle    Stroke Neg Hx    Diabetes Neg Hx    Colon cancer Neg Hx    Colon polyps Neg Hx    Esophageal cancer Neg Hx    Rectal cancer Neg Hx    Stomach cancer Neg Hx     Living situation: the patient lives with their spouse  Sexual Orientation: Straight  Relationship Status: married  Name of spouse / other:[name] If a parent, number of children / ages:2 boys, 43 & 15  Support Systems: family  Financial Stress:  No   Income/Employment/Disability: Employment  Financial planner:  unknown  Educational History: Education: post Engineer, maintenance (IT) work or degree  Religion/Sprituality/World View: unknown  Any cultural differences that may affect / interfere with treatment:  not applicable   Recreation/Hobbies: woodworking  Stressors: Occupational concerns    Strengths: Family  Barriers:  unknown   Legal History: Pending legal issue / charges: The patient has no significant history of legal issues. History of legal issue / charges:  N/A  Medical History/Surgical History: not reviewed Past Medical History:  Diagnosis Date  Arthritis    Asthma    as a child   Bipolar disorder, unspecified (HCC)    hospitalization 2004 for depression, bipolar, ambien overdose   Cancer (HCC) 10/20/2015   skin ca on nose removed   Cholesteatoma 05/2010   s/p R ear surgery, residual tinnitus, planning on L ear surgery   Depression    bipolar   Family history of ischemic heart disease    History of asthma    Migraine without aura, without mention of intractable migraine without mention of status migrainosus    Rosacea 2017   Spinal stenosis    Unspecified adjustment reaction      Past Surgical History:  Procedure Laterality Date   CARPOMETACARPEL SUSPENSION PLASTY Left 10/18/2017   Procedure: LEFT THUMB TRAPEZIECTOMY AND SUSPENSIONPLASTY;  Surgeon: [name], MD;  Location: [facility];  Service: Orthopedics;  Laterality: Left;   CARPOMETACARPEL SUSPENSION PLASTY Right 03/28/2018   Procedure: CARPOMETACARPEL [facility]) SUSPENSION PLASTY AND TRAPEZIECTOMY;  Surgeon: [name], MD;  Location: [facility];  Service: Orthopedics;  Laterality: Right;   COLONOSCOPY  1999   normal (in [place])   ESOPHAGUS SURGERY  2003   torn esophagus   INNER EAR SURGERY  1975, 1995, 2012, 2013   for cholesteatoma, bilateral first L then R   NECK SURGERY  2002   spinal stenosis and arthritis   perianal abscess  1998    Medications: Current Outpatient Medications  Medication Sig Dispense Refill   ARIPiprazole (ABILIFY) 2 MG tablet Take 2 mg by mouth at bedtime.     doxycycline (VIBRA-TABS) 100 MG tablet Take 100 mg by mouth 2 (two) times daily. As needed     fluticasone (FLONASE) 50 MCG/ACT nasal spray Place 2 sprays into both nostrils daily. 16 g 0   ibuprofen (ADVIL) 200 MG tablet Take 3 tablets (600 mg total) by mouth 2 (two) times daily as needed for moderate pain.     ketoconazole (NIZORAL) 2 % cream Apply topically 2 (two) times daily. As needed     levocetirizine (XYZAL) 5 MG tablet Take 1 tablet (5 mg total) by mouth every evening. 30 tablet 0   lithium carbonate (LITHOBID) 300 MG CR tablet Take 2 tablets (600 mg total) by mouth 2 (two) times daily.     metroNIDAZOLE (METROGEL) 0.75 % gel SMARTSIG:1 Sparingly Topical Every Night     Semaglutide-Weight Management (WEGOVY) 0.25 MG/0.5ML SOAJ Inject 0.25 mg into the skin once a week. 2 mL 1   SULFACLEANSE 8/4 8-4 % SUSP Apply 1 application topically 2 (two) times daily. As needed     triamcinolone ointment (KENALOG) 0.1 % Apply topically 2 (two) times daily. As needed     valACYclovir  (VALTREX) 500 MG tablet TAKE 1 TABLET BY MOUTH TWICE DAILY 3 DAYS AT A TIME AS NEEDED 30 tablet 1   No current facility-administered medications for this visit.    Allergies  Allergen Reactions   Morphine And Related Itching          Risk Evaluation and Mitigation Strategies (REMS)  The medication you are ordering is associated with Risk Evaluation and Mitigation Strategies (REMS) and has Elements to [employer] Use (ETASU).   Please use the link below to the FDA website to assure all ETASU are addressed for this medication.  [email or web address]   Risk Evaluation and Mitigation Strategies (REMS)  The medication you are ordering is associated with Risk Evaluation and Mitigation Strategies (REMS) and has Elements to [employer]  Use (ETASU).   Please use the link below to the FDA website to assure all ETASU are addressed for this medication.  [email or web address] and normal is between      No data to display          Risk Evaluation and Mitigation Strategies (REMS)  The medication you are ordering is associated with Risk Evaluation and Mitigation Strategies (REMS) and has Elements to [employer] Use (ETASU).   Please use the link below to the FDA website to assure all ETASU are addressed for this medication.  [email or web address] and normal is      No data to display            Goals: Patient is seeking counseling for mood stabilization. Will utilize cognitive strategies to help manage feelings of anxiety and despair.  Monitor medication compliance -Ongoing. Session was virtual (video) at patient request. He is at home and provider in home office. Revised target date is 6-24  Patient requested a video (WebEx) meeting. He is at home and provider is in home office.  Session Notes: Refael says that he is taking son to [facility] over 4th of July. Will take him to  a [employer]. He talked about ultimate retirement plans which he hopes will be 6 years on short end and 10 years on long end. He is both tired and discouraged with the job situation. He talked about the summer plans with his son. Moods are reported to be stable.                                                                Diagnoses:  Bi Polar Disorder  Plan of Care: Outpatient Therapy and psychotropic medication   Garrel Ridgel, PhD Time:4:10-5:00p 50 min.

## 2022-09-18 ENCOUNTER — Ambulatory Visit: Payer: BC Managed Care – PPO | Admitting: Psychology

## 2022-09-25 ENCOUNTER — Ambulatory Visit (INDEPENDENT_AMBULATORY_CARE_PROVIDER_SITE_OTHER): Payer: BC Managed Care – PPO | Admitting: Psychology

## 2022-09-25 DIAGNOSIS — F319 Bipolar disorder, unspecified: Secondary | ICD-10-CM

## 2022-09-25 NOTE — Progress Notes (Signed)
Name: Erik Smith Date: 09/25/2022 MRN: [id number] DOB: [birth year] PCP: [name], MD     Erik Smith participated from home, via video, and consented to treatment. Therapist participated from home office.   Guardian/Payee:  N/A    Paperwork requested: No   Reason for Visit /Presenting Problem: Improve/manage feeling of agitation  Mental Status Exam: Appearance:   Fairly Groomed     Behavior:  Appropriate  Motor:  Normal  Speech/Language:   Normal Rate  Affect:  Appropriate  Mood:  normal  Thought process:  normal  Thought content:    WNL  Sensory/Perceptual disturbances:    WNL  Orientation:  oriented to person, place, and situation  Attention:  Good  Concentration:  Good  Memory:  WNL  Fund of knowledge:   Good  Insight:    Good  Judgment:   Good  Impulse Control:  Good     Reported Symptoms:  Agitation, frustration, worry  Risk Assessment: Danger to Self:  No Self-injurious Behavior: No Danger to Others: No Duty to Warn:no Physical Aggression / Violence:No  Access to Firearms a concern:  unknown Gang Involvement:No  Patient / guardian was educated about steps to take if suicide or homicide risk level increases between visits: n/a While future psychiatric events cannot be accurately predicted, the patient does not currently require acute inpatient psychiatric care and does not currently meet [facility] involuntary commitment criteria.  Substance Abuse History: Current substance abuse: No     Past Psychiatric History:   Previous psychological history is significant for Bipolar Mania Outpatient Providers:Dr. Evalina Field History of Psych Hospitalization: Yes  Psychological Testing:  N/A    Abuse History:  Victim of: No.,  N/A    Report needed: No. Victim of Neglect:No. Perpetrator of  N/A   Witness / Exposure to Domestic Violence: No   Protective Services Involvement: No  Witness to [employer] Violence:  No    Family History:  Family History  Problem Relation Age of Onset   Coronary artery disease Father 41   Bladder Cancer Father    Sickle cell anemia Mother    Asthma Mother        smoker   Prostate cancer Paternal Grandfather 64       deceased from prostate CA   Lung cancer Paternal Grandmother 91       smoker   Breast cancer Paternal Uncle    Stroke Neg Hx    Diabetes Neg Hx    Colon cancer Neg Hx    Colon polyps Neg Hx    Esophageal cancer Neg Hx    Rectal cancer Neg Hx    Stomach cancer Neg Hx     Living situation: the patient lives with their spouse  Sexual Orientation: Straight  Relationship Status: married  Name of spouse / other:Erik Smith If a parent, number of children / ages:2 boys, 41 & 15  Support Systems: family  Financial Stress:  No   Income/Employment/Disability: Employment  Financial planner:  unknown  Educational History: Education: post Engineer, maintenance (IT) work or degree  Religion/Sprituality/World View: unknown  Any cultural differences that may affect / interfere with treatment:  not applicable   Recreation/Hobbies: woodworking  Stressors: Occupational concerns    Strengths: Family  Barriers:  unknown   Legal History: Pending legal issue / charges: The patient has no significant history of legal issues. History of legal issue / charges:  N/A  Medical History/Surgical History: not reviewed Past Medical History:  Diagnosis Date   Arthritis    Asthma    as a child   Bipolar disorder, unspecified (HCC)    hospitalization 2004 for depression, bipolar, ambien overdose   Cancer (HCC) 10/20/2015   skin ca on nose removed   Cholesteatoma 05/2010   s/p R ear surgery, residual tinnitus, planning on L ear surgery   Depression    bipolar   Family history of ischemic heart disease    History of asthma    Migraine without aura, without mention of intractable migraine without mention of status migrainosus    Rosacea 2017   Spinal stenosis     Unspecified adjustment reaction     Past Surgical History:  Procedure Laterality Date   CARPOMETACARPEL SUSPENSION PLASTY Left 10/18/2017   Procedure: LEFT THUMB TRAPEZIECTOMY AND SUSPENSIONPLASTY;  Surgeon: [name], MD;  Location: [facility];  Service: Orthopedics;  Laterality: Left;   CARPOMETACARPEL SUSPENSION PLASTY Right 03/28/2018   Procedure: CARPOMETACARPEL [facility]) SUSPENSION PLASTY AND TRAPEZIECTOMY;  Surgeon: [name], MD;  Location: [facility];  Service: Orthopedics;  Laterality: Right;   COLONOSCOPY  1999   normal (in [place])   ESOPHAGUS SURGERY  2003   torn esophagus   INNER EAR SURGERY  1975, 1995, 2012, 2013   for cholesteatoma, bilateral first L then R   NECK SURGERY  2002   spinal stenosis and arthritis   perianal abscess  1998    Medications: Current Outpatient Medications  Medication Sig Dispense Refill   ARIPiprazole (ABILIFY) 2 MG tablet Take 2 mg by mouth at bedtime.     doxycycline (VIBRA-TABS) 100 MG tablet Take 100 mg by mouth 2 (two) times daily. As needed     fluticasone (FLONASE) 50 MCG/ACT nasal spray Place 2 sprays into both nostrils daily. 16 g 0   ibuprofen (ADVIL) 200 MG tablet Take 3 tablets (600 mg total) by mouth 2 (two) times daily as needed for moderate pain.     ketoconazole (NIZORAL) 2 % cream Apply topically 2 (two) times daily. As needed     levocetirizine (XYZAL) 5 MG tablet Take 1 tablet (5 mg total) by mouth every evening. 30 tablet 0   lithium carbonate (LITHOBID) 300 MG CR tablet Take 2 tablets (600 mg total) by mouth 2 (two) times daily.     metroNIDAZOLE (METROGEL) 0.75 % gel SMARTSIG:1 Sparingly Topical Every Night     Semaglutide-Weight Management (WEGOVY) 0.25 MG/0.5ML SOAJ Inject 0.25 mg into the skin once a week. 2 mL 1   SULFACLEANSE 8/4 8-4 % SUSP Apply 1 application topically 2 (two) times daily. As needed     triamcinolone ointment (KENALOG) 0.1 % Apply topically 2 (two) times daily.  As needed     valACYclovir (VALTREX) 500 MG tablet TAKE 1 TABLET BY MOUTH TWICE DAILY 3 DAYS AT A TIME AS NEEDED 30 tablet 1   No current facility-administered medications for this visit.    Allergies  Allergen Reactions   Morphine And Codeine Itching          Risk Evaluation and Mitigation Strategies (REMS)  The medication you are ordering is associated with Risk Evaluation and Mitigation Strategies (REMS) and has Elements to [employer] Use (ETASU).   Please use the link below to the FDA website to assure all ETASU are addressed for this medication.  [email or web address]   Risk Evaluation and Mitigation Strategies (REMS)  The medication you are ordering  is associated with Risk Evaluation and Mitigation Strategies (REMS) and has Elements to [employer] (ETASU).   Please use the link below to the FDA website to assure all ETASU are addressed for this medication.  [email or web address] and normal is between      No data to display          Risk Evaluation and Mitigation Strategies (REMS)  The medication you are ordering is associated with Risk Evaluation and Mitigation Strategies (REMS) and has Elements to [employer] Use (ETASU).   Please use the link below to the FDA website to assure all ETASU are addressed for this medication.  [email or web address] and normal is      No data to display            Goals: Patient is seeking counseling for mood stabilization. Will utilize cognitive strategies to help manage feelings of anxiety and despair.  Monitor medication compliance -Ongoing. Session was virtual (video) at patient request. He is at home and provider in home office. Revised target date is 12-24  Patient requested a video Public affairs consultant) meeting. He is at home and provider is in home office.  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 It is in [place]. He says that things have been stressful at home. His wife is "not likeing" getting older and has a number of insecurities. She feels "ignored" and thinks he is not attracted to her. We talked about how to reconnect over the summer and make the relationship a priority. He agrees and states that will become his focus. Tomoya also says he has been talking with his ex-wife about expectations of their son. She expects son to work and Tobi feels he "needs to be a kid". Son is living at both homes half time. His ex-wife is from [place] and has a very different outlook on what their son needs.                                                                      Diagnoses:  Bi Polar Disorder  Plan of Care: Outpatient Therapy and psychotropic medication   Garrel Ridgel, PhD Time:2:15-3:00p 45 min.

## 2022-10-16 ENCOUNTER — Ambulatory Visit (INDEPENDENT_AMBULATORY_CARE_PROVIDER_SITE_OTHER): Payer: BC Managed Care – PPO | Admitting: Psychology

## 2022-10-16 DIAGNOSIS — F319 Bipolar disorder, unspecified: Secondary | ICD-10-CM

## 2022-10-16 NOTE — Progress Notes (Signed)
Name: Erik Smith Date: 10/16/2022 MRN: [id number] DOB: Jul 23, 1965 PCP: [name], MD     Erik Bosworth participated from home, via video, and consented to treatment. Therapist participated from home office.   Guardian/Payee:  N/A    Paperwork requested: No   Reason for Visit /Presenting Problem: Improve/manage feeling of agitation  Mental Status Exam: Appearance:   Fairly Groomed     Behavior:  Appropriate  Motor:  Normal  Speech/Language:   Normal Rate  Affect:  Appropriate  Mood:  normal  Thought process:  normal  Thought content:    WNL  Sensory/Perceptual disturbances:    WNL  Orientation:  oriented to person, place, and situation  Attention:  Good  Concentration:  Good  Memory:  WNL  Fund of knowledge:   Good  Insight:    Good  Judgment:   Good  Impulse Control:  Good     Reported Symptoms:  Agitation, frustration, worry  Risk Assessment: Danger to Self:  No Self-injurious Behavior: No Danger to Others: No Duty to Warn:no Physical Aggression / Violence:No  Access to Firearms a concern:  unknown Gang Involvement:No  Patient / guardian was educated about steps to take if suicide or homicide risk level increases between visits: n/a While future psychiatric events cannot be accurately predicted, the patient does not currently require acute inpatient psychiatric care and does not currently meet [facility] involuntary commitment criteria.  Substance Abuse History: Current substance abuse: No     Past Psychiatric History:   Previous psychological history is significant for Bipolar Mania Outpatient Providers:Dr. Evalina Field History of Psych Hospitalization: Yes  Psychological Testing:  N/A    Abuse History:  Victim of: No.,  N/A    Report needed: No. Victim of Neglect:No. Perpetrator of  N/A   Witness / Exposure to Domestic Violence: No   Protective Services Involvement: No   Witness to [employer] Violence:  No   Family History:  Family History  Problem Relation Age of Onset   Coronary artery disease Father 88   Bladder Cancer Father    Sickle cell anemia Mother    Asthma Mother        smoker   Prostate cancer Paternal Grandfather 1       deceased from prostate CA   Lung cancer Paternal Grandmother 72       smoker   Breast cancer Paternal Uncle    Stroke Neg Hx    Diabetes Neg Hx    Colon cancer Neg Hx    Colon polyps Neg Hx    Esophageal cancer Neg Hx    Rectal cancer Neg Hx    Stomach cancer Neg Hx     Living situation: the patient lives with their spouse  Sexual Orientation: Straight  Relationship Status: married  Name of spouse / other:Erik Smith If a parent, number of children / ages:2 boys, 42 & 15  Support Systems: family  Financial Stress:  No   Income/Employment/Disability: Employment  Financial planner:  unknown  Educational History: Education: post Engineer, maintenance (IT) work or degree  Religion/Sprituality/World View: unknown  Any cultural differences that may affect / interfere with treatment:  not applicable   Recreation/Hobbies: woodworking  Stressors: Occupational concerns    Strengths: Family  Barriers:  unknown   Legal History: Pending legal issue / charges: The  patient has no significant history of legal issues. History of legal issue / charges:  N/A  Medical History/Surgical History: not reviewed Past Medical History:  Diagnosis Date   Arthritis    Asthma    as a child   Bipolar disorder, unspecified (HCC)    hospitalization 2004 for depression, bipolar, ambien overdose   Cancer (HCC) 10/20/2015   skin ca on nose removed   Cholesteatoma 05/2010   s/p R ear surgery, residual tinnitus, planning on L ear surgery   Depression    bipolar   Family history of ischemic heart disease    History of asthma    Migraine without aura, without mention of intractable migraine without mention of status migrainosus     Rosacea 2017   Spinal stenosis    Unspecified adjustment reaction     Past Surgical History:  Procedure Laterality Date   CARPOMETACARPEL SUSPENSION PLASTY Left 10/18/2017   Procedure: LEFT THUMB TRAPEZIECTOMY AND SUSPENSIONPLASTY;  Surgeon: [name], MD;  Location: [facility];  Service: Orthopedics;  Laterality: Left;   CARPOMETACARPEL SUSPENSION PLASTY Right 03/28/2018   Procedure: CARPOMETACARPEL [facility]) SUSPENSION PLASTY AND TRAPEZIECTOMY;  Surgeon: [name], MD;  Location: [facility];  Service: Orthopedics;  Laterality: Right;   COLONOSCOPY  1999   normal (in [place])   ESOPHAGUS SURGERY  2003   torn esophagus   INNER EAR SURGERY  1975, 1995, 2012, 2013   for cholesteatoma, bilateral first L then R   NECK SURGERY  2002   spinal stenosis and arthritis   perianal abscess  1998    Medications: Current Outpatient Medications  Medication Sig Dispense Refill   ARIPiprazole (ABILIFY) 2 MG tablet Take 2 mg by mouth at bedtime.     doxycycline (VIBRA-TABS) 100 MG tablet Take 100 mg by mouth 2 (two) times daily. As needed     fluticasone (FLONASE) 50 MCG/ACT nasal spray Place 2 sprays into both nostrils daily. 16 g 0   ibuprofen (ADVIL) 200 MG tablet Take 3 tablets (600 mg total) by mouth 2 (two) times daily as needed for moderate pain.     ketoconazole (NIZORAL) 2 % cream Apply topically 2 (two) times daily. As needed     levocetirizine (XYZAL) 5 MG tablet Take 1 tablet (5 mg total) by mouth every evening. 30 tablet 0   lithium carbonate (LITHOBID) 300 MG CR tablet Take 2 tablets (600 mg total) by mouth 2 (two) times daily.     metroNIDAZOLE (METROGEL) 0.75 % gel SMARTSIG:1 Sparingly Topical Every Night     Semaglutide-Weight Management (WEGOVY) 0.25 MG/0.5ML SOAJ Inject 0.25 mg into the skin once a week. 2 mL 1   SULFACLEANSE 8/4 8-4 % SUSP Apply 1 application topically 2 (two) times daily. As needed     triamcinolone ointment (KENALOG) 0.1 %  Apply topically 2 (two) times daily. As needed     valACYclovir (VALTREX) 500 MG tablet TAKE 1 TABLET BY MOUTH TWICE DAILY 3 DAYS AT A TIME AS NEEDED 30 tablet 1   No current facility-administered medications for this visit.    Allergies  Allergen Reactions   Morphine And Codeine Itching          Risk Evaluation and Mitigation Strategies (REMS)  The medication you are ordering is associated with Risk Evaluation and Mitigation Strategies (REMS) and has Elements to [employer] Use (ETASU).   Please use the link below to the FDA website to assure all ETASU are addressed for this medication.  [email or web address]   Risk Evaluation and Mitigation Strategies (REMS)  The medication you are ordering is associated with Risk Evaluation and Mitigation Strategies (REMS) and has Elements to [employer] Use (ETASU).   Please use the link below to the FDA website to assure all ETASU are addressed for this medication.  [email or web address] and normal is between      No data to display          Risk Evaluation and Mitigation Strategies (REMS)  The medication you are ordering is associated with Risk Evaluation and Mitigation Strategies (REMS) and has Elements to [employer] Use (ETASU).   Please use the link below to the FDA website to assure all ETASU are addressed for this medication.  [email or web address] and normal is      No data to display            Goals: Patient is seeking counseling for mood stabilization. Will utilize cognitive strategies to help manage feelings of anxiety and despair.  Monitor medication compliance -Ongoing. Session was virtual (video) at patient request. He is at home and provider in home office. Revised target date is 12-24  Patient requested a video Public affairs consultant) meeting. He is at home and provider is in home office.  Session  Notes: Nathaneil says that he has not heard anything from the interview in [place]. He is at a loss for what is happening in his interviews. 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 His son got his driver's license a week ago. His mother is not longer a licensed driver (suspended due to medical reasons). He has spent considerable time taking care of his mother and her estate. 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  Talked about trying to remain optimistic rather than be discouraged about his job prospects. He has a good perspective.                                                                          Diagnoses:  Bi Polar Disorder  Plan of Care: Outpatient Therapy and psychotropic medication   Garrel Ridgel, PhD Time:4:15p-5:00p 45 min.

## 2022-10-19 ENCOUNTER — Encounter: Payer: Self-pay | Admitting: Family Medicine

## 2022-10-22 ENCOUNTER — Other Ambulatory Visit: Payer: Self-pay | Admitting: Family Medicine

## 2022-10-22 DIAGNOSIS — E785 Hyperlipidemia, unspecified: Secondary | ICD-10-CM

## 2022-10-22 DIAGNOSIS — Z8249 Family history of ischemic heart disease and other diseases of the circulatory system: Secondary | ICD-10-CM

## 2022-10-22 DIAGNOSIS — Z125 Encounter for screening for malignant neoplasm of prostate: Secondary | ICD-10-CM

## 2022-10-22 DIAGNOSIS — F319 Bipolar disorder, unspecified: Secondary | ICD-10-CM

## 2022-10-23 ENCOUNTER — Other Ambulatory Visit: Payer: BC Managed Care – PPO

## 2022-10-27 ENCOUNTER — Other Ambulatory Visit (INDEPENDENT_AMBULATORY_CARE_PROVIDER_SITE_OTHER): Payer: BC Managed Care – PPO

## 2022-10-27 DIAGNOSIS — E785 Hyperlipidemia, unspecified: Secondary | ICD-10-CM | POA: Diagnosis not present

## 2022-10-27 DIAGNOSIS — F319 Bipolar disorder, unspecified: Secondary | ICD-10-CM

## 2022-10-27 DIAGNOSIS — Z8249 Family history of ischemic heart disease and other diseases of the circulatory system: Secondary | ICD-10-CM

## 2022-10-27 DIAGNOSIS — Z125 Encounter for screening for malignant neoplasm of prostate: Secondary | ICD-10-CM | POA: Diagnosis not present

## 2022-10-27 LAB — CBC WITH DIFFERENTIAL/PLATELET
Basophils Absolute: 0 10*3/uL (ref 0.0–0.1)
Basophils Relative: 1.1 % (ref 0.0–3.0)
Eosinophils Absolute: 0.2 10*3/uL (ref 0.0–0.7)
Eosinophils Relative: 4 % (ref 0.0–5.0)
HCT: 44.9 % (ref 39.0–52.0)
Hemoglobin: 14.9 g/dL (ref 13.0–17.0)
Lymphocytes Relative: 39.2 % (ref 12.0–46.0)
Lymphs Abs: 1.7 10*3/uL (ref 0.7–4.0)
MCHC: 33.1 g/dL (ref 30.0–36.0)
MCV: 89.2 fl (ref 78.0–100.0)
Monocytes Absolute: 0.5 10*3/uL (ref 0.1–1.0)
Monocytes Relative: 10.4 % (ref 3.0–12.0)
Neutro Abs: 2 10*3/uL (ref 1.4–7.7)
Neutrophils Relative %: 45.3 % (ref 43.0–77.0)
Platelets: 220 10*3/uL (ref 150.0–400.0)
RBC: 5.04 Mil/uL (ref 4.22–5.81)
RDW: 13.7 % (ref 11.5–15.5)
WBC: 4.4 10*3/uL (ref 4.0–10.5)

## 2022-10-27 LAB — COMPREHENSIVE METABOLIC PANEL
ALT: 21 U/L (ref 0–53)
AST: 18 U/L (ref 0–37)
Albumin: 4.4 g/dL (ref 3.5–5.2)
Alkaline Phosphatase: 65 U/L (ref 39–117)
BUN: 13 mg/dL (ref 6–23)
CO2: 28 mEq/L (ref 19–32)
Calcium: 9.2 mg/dL (ref 8.4–10.5)
Chloride: 104 mEq/L (ref 96–112)
Creatinine, Ser: 0.91 mg/dL (ref 0.40–1.50)
GFR: 92.98 mL/min (ref >60.00)
Glucose, Bld: 77 mg/dL (ref 70–99)
Potassium: 4.6 mEq/L (ref 3.5–5.1)
Sodium: 140 mEq/L (ref 135–145)
Total Bilirubin: 0.5 mg/dL (ref 0.2–1.2)
Total Protein: 6.8 g/dL (ref 6.0–8.3)

## 2022-10-27 LAB — LIPID PANEL
Cholesterol: 193 mg/dL (ref 0–200)
HDL: 48.1 mg/dL (ref >39.00)
NonHDL: 145.13
Total CHOL/HDL Ratio: 4
Triglycerides: 209 mg/dL — ABNORMAL HIGH (ref 0.0–149.0)
VLDL: 41.8 mg/dL — ABNORMAL HIGH (ref 0.0–40.0)

## 2022-10-27 LAB — PSA: PSA: 0.21 ng/mL (ref 0.10–4.00)

## 2022-10-27 LAB — LDL CHOLESTEROL, DIRECT: Direct LDL: 100 mg/dL

## 2022-10-27 LAB — TSH: TSH: 1.5 u[IU]/mL (ref 0.35–5.50)

## 2022-10-28 LAB — LITHIUM LEVEL: Lithium Lvl: <0.3 mmol/L — ABNORMAL LOW (ref 0.6–1.2)

## 2022-10-30 ENCOUNTER — Ambulatory Visit (INDEPENDENT_AMBULATORY_CARE_PROVIDER_SITE_OTHER): Payer: BC Managed Care – PPO | Admitting: Psychology

## 2022-10-30 ENCOUNTER — Ambulatory Visit (INDEPENDENT_AMBULATORY_CARE_PROVIDER_SITE_OTHER): Payer: BC Managed Care – PPO | Admitting: Family Medicine

## 2022-10-30 ENCOUNTER — Encounter: Payer: Self-pay | Admitting: Family Medicine

## 2022-10-30 VITALS — BP 138/86 | HR 81 | Temp 97.5°F | Ht 70.0 in | Wt 234.1 lb

## 2022-10-30 DIAGNOSIS — Z0000 Encounter for general adult medical examination without abnormal findings: Secondary | ICD-10-CM

## 2022-10-30 DIAGNOSIS — F319 Bipolar disorder, unspecified: Secondary | ICD-10-CM | POA: Diagnosis not present

## 2022-10-30 DIAGNOSIS — Z1211 Encounter for screening for malignant neoplasm of colon: Secondary | ICD-10-CM | POA: Diagnosis not present

## 2022-10-30 DIAGNOSIS — L719 Rosacea, unspecified: Secondary | ICD-10-CM

## 2022-10-30 DIAGNOSIS — Z8249 Family history of ischemic heart disease and other diseases of the circulatory system: Secondary | ICD-10-CM

## 2022-10-30 DIAGNOSIS — E669 Obesity, unspecified: Secondary | ICD-10-CM

## 2022-10-30 DIAGNOSIS — E785 Hyperlipidemia, unspecified: Secondary | ICD-10-CM

## 2022-10-30 NOTE — Assessment & Plan Note (Signed)
Encouraged healthy diet choices to affect sustainable weight loss.  He is seeing bariatric clinic through [place], [place] was unaffordable. Has f/u later this week.

## 2022-10-30 NOTE — Progress Notes (Signed)
Name: Erik Smith Date: 10/30/2022 MRN: [id number] DOB: 1965-07-26 PCP: [name], MD     Erik Smith participated from home, via video, and consented to treatment. Therapist participated from home office.   Guardian/Payee:  N/A    Paperwork requested: No   Reason for Visit /Presenting Problem: Improve/manage feeling of agitation  Mental Status Exam: Appearance:   Fairly Groomed     Behavior:  Appropriate  Motor:  Normal  Speech/Language:   Normal Rate  Affect:  Appropriate  Mood:  normal  Thought process:  normal  Thought content:    WNL  Sensory/Perceptual disturbances:    WNL  Orientation:  oriented to person, place, and situation  Attention:  Good  Concentration:  Good  Memory:  WNL  Fund of knowledge:   Good  Insight:    Good  Judgment:   Good  Impulse Control:  Good     Reported Symptoms:  Agitation, frustration, worry  Risk Assessment: Danger to Self:  No Self-injurious Behavior: No Danger to Others: No Duty to Warn:no Physical Aggression / Violence:No  Access to Firearms a concern:  unknown Gang Involvement:No  Patient / guardian was educated about steps to take if suicide or homicide risk level increases between visits: n/a While future psychiatric events cannot be accurately predicted, the patient does not currently require acute inpatient psychiatric care and does not currently meet [facility] involuntary commitment criteria.  Substance Abuse History: Current substance abuse: No     Past Psychiatric History:   Previous psychological history is significant for Bipolar Mania Outpatient Providers:Dr. Evalina Field History of Psych Hospitalization: Yes  Psychological Testing:  N/A    Abuse History:  Victim of: No.,  N/A    Report needed: No. Victim of Neglect:No. Perpetrator of  N/A   Witness / Exposure to Domestic Violence: No   Protective  Services Involvement: No  Witness to [employer] Violence:  No   Family History:  Family History  Problem Relation Age of Onset   Coronary artery disease Father 13   Bladder Cancer Father    Sickle cell anemia Mother    Asthma Mother        smoker   Prostate cancer Paternal Grandfather 51       deceased from prostate CA   Lung cancer Paternal Grandmother 68       smoker   Breast cancer Paternal Uncle    Stroke Neg Hx    Diabetes Neg Hx    Colon cancer Neg Hx    Colon polyps Neg Hx    Esophageal cancer Neg Hx    Rectal cancer Neg Hx    Stomach cancer Neg Hx     Living situation: the patient lives with their spouse  Sexual Orientation: Straight  Relationship Status: married  Name of spouse / other:Erik Smith If a parent, number of children / ages:2 boys, 71 & 15  Support Systems: family  Financial Stress:  No   Income/Employment/Disability: Employment  Financial planner:  unknown  Educational History: Education: post Engineer, maintenance (IT) work or degree  Religion/Sprituality/World View: unknown  Any cultural differences that may affect / interfere with treatment:  not applicable   Recreation/Hobbies: woodworking  Stressors: Occupational concerns    Strengths:  Family  Barriers:  unknown   Legal History: Pending legal issue / charges: The patient has no significant history of legal issues. History of legal issue / charges:  N/A  Medical History/Surgical History: not reviewed Past Medical History:  Diagnosis Date   Arthritis    Asthma    as a child   Bipolar disorder, unspecified (HCC)    hospitalization 2004 for depression, bipolar, ambien overdose   Cancer (HCC) 10/20/2015   skin ca on nose removed   Cholesteatoma 05/2010   s/p R ear surgery, residual tinnitus, planning on L ear surgery   Depression    bipolar   Family history of ischemic heart disease    History of asthma    Migraine without aura, without mention of intractable migraine without mention of  status migrainosus    Rosacea 2017   Spinal stenosis    Unspecified adjustment reaction     Past Surgical History:  Procedure Laterality Date   CARPOMETACARPEL SUSPENSION PLASTY Left 10/18/2017   Procedure: LEFT THUMB TRAPEZIECTOMY AND SUSPENSIONPLASTY;  Surgeon: [name], MD;  Location: [facility];  Service: Orthopedics;  Laterality: Left;   CARPOMETACARPEL SUSPENSION PLASTY Right 03/28/2018   Procedure: CARPOMETACARPEL [facility]) SUSPENSION PLASTY AND TRAPEZIECTOMY;  Surgeon: [name], MD;  Location: [facility];  Service: Orthopedics;  Laterality: Right;   COLONOSCOPY  1999   normal (in [place])   ESOPHAGUS SURGERY  2003   torn esophagus   INNER EAR SURGERY  1975, 1995, 2012, 2013   for cholesteatoma, bilateral first L then R   NECK SURGERY  2002   spinal stenosis and arthritis   perianal abscess  1998    Medications: Current Outpatient Medications  Medication Sig Dispense Refill   ARIPiprazole (ABILIFY) 2 MG tablet Take 2 mg by mouth at bedtime.     doxycycline (VIBRA-TABS) 100 MG tablet Take 100 mg by mouth 2 (two) times daily. As needed     fluticasone (FLONASE) 50 MCG/ACT nasal spray Place 2 sprays into both nostrils daily. 16 g 0   ibuprofen (ADVIL) 200 MG tablet Take 3 tablets (600 mg total) by mouth 2 (two) times daily as needed for moderate pain.     lamoTRIgine (LAMICTAL) 100 MG tablet Take 1 tablet (100 mg total) by mouth 2 (two) times daily.     levocetirizine (XYZAL) 5 MG tablet Take 1 tablet (5 mg total) by mouth every evening. 30 tablet 0   valACYclovir (VALTREX) 500 MG tablet TAKE 1 TABLET BY MOUTH TWICE DAILY 3 DAYS AT A TIME AS NEEDED 30 tablet 1   No current facility-administered medications for this visit.    Allergies  Allergen Reactions   Morphine And Codeine Itching          Risk Evaluation and Mitigation Strategies (REMS)  The medication you are ordering is associated with Risk Evaluation and Mitigation  Strategies (REMS) and has Elements to [employer] Use (ETASU).   Please use the link below to the FDA website to assure all ETASU are addressed for this medication.  [email or web address]   Risk Evaluation and Mitigation Strategies (REMS)  The medication you are ordering is associated with Risk Evaluation and Mitigation Strategies (REMS) and has Elements to [employer] Use (ETASU).   Please use the link below to the FDA website to assure all ETASU are addressed for this medication.  [email or web address] and normal is between      No data to display  Risk Evaluation and Mitigation Strategies (REMS)  The medication you are ordering is associated with Risk Evaluation and Mitigation Strategies (REMS) and has Elements to [employer] Use (ETASU).   Please use the link below to the FDA website to assure all ETASU are addressed for this medication.  [email or web address] and normal is      No data to display            Goals: Patient is seeking counseling for mood stabilization. Will utilize cognitive strategies to help manage feelings of anxiety and despair.  Monitor medication compliance -Ongoing. Session was virtual (video) at patient request. He is at home and provider in home office. Revised target date is 12-24  Patient requested a video Public affairs consultant) meeting. He is at home and provider is in home office.  Session Notes: Gerald went to [place]. His mother had her Psych. Evaluation. He is going bqck this weekend as he will get the results on Monday. It will be challenging and complex. At home, he is putting in a new fire pit. [place]. Had more interviews but no offers at this point. Moods and medicines are stable.                                                                             Diagnoses:  Bi Polar Disorder  Plan of Care: Outpatient Therapy and psychotropic medication   Erik Ridgel, PhD Time:4:15p-5:00p 45 min.

## 2022-10-30 NOTE — Assessment & Plan Note (Signed)
Chronic, stable period on abilify and lamictal.

## 2022-10-30 NOTE — Progress Notes (Signed)
Ph: [phone] Fax: [phone]   Patient ID: Erik Smith, male    DOB: [birth year], 57 y.o.   MRN: [id number]  This visit was conducted in person.  BP 138/86   Pulse 81   Temp (!) 97.5 F (36.4 C) (Temporal)   Ht 5\' 10"  (1.778 m)   Wt 234 lb 2 oz (106.2 kg)   SpO2 96%   BMI 33.59 kg/m    CC: CPE  Subjective:   HPI: Rasheed Welty is a 57 y.o. male presenting on 10/30/2022 for Annual Exam   Obesity - following program with [facility]. Reg[facility] Lutes was not covered by insurance. Has tried Keto diet, weight watchers, has seen [place] [facility].   Bipolar 1 - on abilify 2mg  at bedtime and recently lamictal replaced lithiuim. Lithium caused GI upset and diarrhea. Sees psychiatrist Dr Lowanda Foster in [place] (virtual) - retiring, planning to establish with new psychiatrist. Also sees Dr Dellia Cloud psychologist.   Fmhx early CAD (father MI 77s, heavy smoker).    Rosacea sees dermatologist English Black PA - on triamcinolone and doxycycline.   Preventative: Colon cancer screening - colonoscopy 1999 for blood in stool, normal. will call to reschedule. In interim, sent home with stool kit.  Prostate cancer screening - strong stream, no nocturia. + fmhx prostate cancer - paternal grandfather at older age. Checks yearly.  Lung cancer screening - not eligible  Flu yearly COVID vaccine - Pfizer vaccine 06/2019, 07/2019, booster 02/2020 Tdap 2016 Shingrix - 10/2020, 10/2021 Seat belt use discussed  Sunscreen use discussed. No changing moles on skin. Sees derm yearly.  Sleep - averaging 6-8 hours/night  Smoking - non smoker Alcohol - 1-2 drinks/day (martini) Dentist yearly  Eye exam yearly    Lives with wife and son. 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  Diet: Not a lot of fast food, good water, fruits/vegetables daily     Relevant past medical, surgical, family and social history reviewed and updated as indicated. Interim medical history since our last visit  reviewed. Allergies and medications reviewed and updated. Outpatient Medications Prior to Visit  Medication Sig Dispense Refill   ARIPiprazole (ABILIFY) 2 MG tablet Take 2 mg by mouth at bedtime.     doxycycline (VIBRA-TABS) 100 MG tablet Take 100 mg by mouth 2 (two) times daily. As needed     fluticasone (FLONASE) 50 MCG/ACT nasal spray Place 2 sprays into both nostrils daily. 16 g 0   ibuprofen (ADVIL) 200 MG tablet Take 3 tablets (600 mg total) by mouth 2 (two) times daily as needed for moderate pain.     lamoTRIgine (LAMICTAL) 100 MG tablet Take 1 tablet (100 mg total) by mouth 2 (two) times daily.     levocetirizine (XYZAL) 5 MG tablet Take 1 tablet (5 mg total) by mouth every evening. 30 tablet 0   valACYclovir (VALTREX) 500 MG tablet TAKE 1 TABLET BY MOUTH TWICE DAILY 3 DAYS AT A TIME AS NEEDED 30 tablet 1   ketoconazole (NIZORAL) 2 % cream Apply topically 2 (two) times daily. As needed     lithium carbonate (LITHOBID) 300 MG CR tablet Take 2 tablets (600 mg total) by mouth 2 (two) times daily.     metroNIDAZOLE (METROGEL) 0.75 % gel SMARTSIG:1 Sparingly Topical Every Night     Semaglutide-Weight Management (WEGOVY) 0.25 MG/0.5ML SOAJ Inject 0.25 mg into the skin once a week. 2 mL 1   SULFACLEANSE 8/4 8-4 % SUSP Apply 1 application topically 2 (two)  times daily. As needed     triamcinolone ointment (KENALOG) 0.1 % Apply topically 2 (two) times daily. As needed     No facility-administered medications prior to visit.     Per HPI unless specifically indicated in ROS section below Review of Systems  Constitutional:  Negative for activity change, appetite change, chills, fatigue, fever and unexpected weight change.  HENT:  Negative for hearing loss.   Eyes:  Negative for visual disturbance.  Respiratory:  Negative for cough, chest tightness, shortness of breath and wheezing.   Cardiovascular:  Negative for chest pain, palpitations and leg swelling.  Gastrointestinal:  Negative for  abdominal distention, abdominal pain, blood in stool, constipation, diarrhea, nausea and vomiting.  Genitourinary:  Negative for difficulty urinating and hematuria.  Musculoskeletal:  Negative for arthralgias, myalgias and neck pain.  Skin:  Negative for rash.  Neurological:  Negative for dizziness, seizures, syncope and headaches.  Hematological:  Negative for adenopathy. Does not bruise/bleed easily.  Psychiatric/Behavioral:  Negative for dysphoric mood. The patient is not nervous/anxious.     Objective:  BP 138/86   Pulse 81   Temp (!) 97.5 F (36.4 C) (Temporal)   Ht 5\' 10"  (1.778 m)   Wt 234 lb 2 oz (106.2 kg)   SpO2 96%   BMI 33.59 kg/m   Wt Readings from Last 3 Encounters:  10/30/22 234 lb 2 oz (106.2 kg)  10/28/21 229 lb 2 oz (103.9 kg)  04/28/21 220 lb 6 oz (100 kg)      Physical Exam Vitals and nursing note reviewed.  Constitutional:      General: He is not in acute distress.    Appearance: Normal appearance. He is well-developed. He is not ill-appearing.  HENT:     Head: Normocephalic and atraumatic.     Right Ear: Tympanic membrane, ear canal and external ear normal.     Left Ear: Tympanic membrane, ear canal and external ear normal.     Ears:     Comments: Wears hearing aides    Nose: Nose normal.     Mouth/Throat:     Mouth: Mucous membranes are moist.     Pharynx: Oropharynx is clear. No oropharyngeal exudate or posterior oropharyngeal erythema.  Eyes:     General: No scleral icterus.    Extraocular Movements: Extraocular movements intact.     Conjunctiva/sclera: Conjunctivae normal.     Pupils: Pupils are equal, round, and reactive to light.  Neck:     Thyroid: No thyroid mass or thyromegaly.  Cardiovascular:     Rate and Rhythm: Normal rate and regular rhythm.     Pulses: Normal pulses.          Radial pulses are 2+ on the right side and 2+ on the left side.     Heart sounds: Normal heart sounds. No murmur heard. Pulmonary:     Effort: Pulmonary  effort is normal. No respiratory distress.     Breath sounds: Normal breath sounds. No wheezing, rhonchi or rales.  Abdominal:     General: Bowel sounds are normal. There is no distension.     Palpations: Abdomen is soft. There is no mass.     Tenderness: There is no abdominal tenderness. There is no guarding or rebound.     Hernia: No hernia is present.  Musculoskeletal:        General: Normal range of motion.     Cervical back: Normal range of motion and neck supple.     Right lower  leg: No edema.     Left lower leg: No edema.  Lymphadenopathy:     Cervical: No cervical adenopathy.  Skin:    General: Skin is warm and dry.     Findings: No rash.  Neurological:     General: No focal deficit present.     Mental Status: He is alert and oriented to person, place, and time.  Psychiatric:        Mood and Affect: Mood normal.        Behavior: Behavior normal.        Thought Content: Thought content normal.        Judgment: Judgment normal.       Results for orders placed or performed in visit on 10/27/22  Lithium level  Result Value Ref Range   Lithium Lvl <0.3 (L) 0.6 - 1.2 mmol/L  TSH  Result Value Ref Range   TSH 1.50 0.35 - 5.50 uIU/mL  PSA  Result Value Ref Range   PSA 0.21 0.10 - 4.00 ng/mL  CBC with Differential/Platelet  Result Value Ref Range   WBC 4.4 4.0 - 10.5 K/uL   RBC 5.04 4.22 - 5.81 Mil/uL   Hemoglobin 14.9 13.0 - 17.0 g/dL   HCT 34.7 42.5 - 95.6 %   MCV 89.2 78.0 - 100.0 fl   MCHC 33.1 30.0 - 36.0 g/dL   RDW 38.7 56.4 - 33.2 %   Platelets 220.0 150.0 - 400.0 K/uL   Neutrophils Relative % 45.3 43.0 - 77.0 %   Lymphocytes Relative 39.2 12.0 - 46.0 %   Monocytes Relative 10.4 3.0 - 12.0 %   Eosinophils Relative 4.0 0.0 - 5.0 %   Basophils Relative 1.1 0.0 - 3.0 %   Neutro Abs 2.0 1.4 - 7.7 K/uL   Lymphs Abs 1.7 0.7 - 4.0 K/uL   Monocytes Absolute 0.5 0.1 - 1.0 K/uL   Eosinophils Absolute 0.2 0.0 - 0.7 K/uL   Basophils Absolute 0.0 0.0 - 0.1 K/uL   Comprehensive metabolic panel  Result Value Ref Range   Sodium 140 135 - 145 mEq/L   Potassium 4.6 3.5 - 5.1 mEq/L   Chloride 104 96 - 112 mEq/L   CO2 28 19 - 32 mEq/L   Glucose, Bld 77 70 - 99 mg/dL   BUN 13 6 - 23 mg/dL   Creatinine, Ser 9.51 0.40 - 1.50 mg/dL   Total Bilirubin 0.5 0.2 - 1.2 mg/dL   Alkaline Phosphatase 65 39 - 117 U/L   AST 18 0 - 37 U/L   ALT 21 0 - 53 U/L   Total Protein 6.8 6.0 - 8.3 g/dL   Albumin 4.4 3.5 - 5.2 g/dL   GFR 88.41 >66.06 mL/min   Calcium 9.2 8.4 - 10.5 mg/dL  Lipid panel  Result Value Ref Range   Cholesterol 193 0 - 200 mg/dL   Triglycerides 301.6 (H) 0.0 - 149.0 mg/dL   HDL 01.09 >32.35 mg/dL   VLDL 57.3 (H) 0.0 - 22.0 mg/dL   Total CHOL/HDL Ratio 4    NonHDL 145.13   LDL cholesterol, direct  Result Value Ref Range   Direct LDL 100.0 mg/dL    Assessment & Plan:   Problem List Items Addressed This Visit     Healthcare maintenance - Primary (Chronic)    Preventative protocols reviewed and updated unless pt declined. Discussed healthy diet and lifestyle.       Bipolar 1 disorder (HCC)    Chronic, stable period on abilify and lamictal.  Obesity, Class I, BMI 30-34.9    Encouraged healthy diet choices to affect sustainable weight loss.  He is seeing bariatric clinic through [place], [place] was unaffordable. Has f/u later this week.       Family history of premature CAD    EKG stable from 2020.  Declines coronary CT and calcium score Check Lp(a) next labs.       Dyslipidemia    Encouraged healthy diet to improve triglyceride control. The 10-year ASCVD risk score (Arnett DK, et al., 2019) is: 8.2%   Values used to calculate the score:     Age: 56 years     Sex: Male     Is Non-Hispanic African American: No     Diabetic: No     Tobacco smoker: No     Systolic Blood Pressure: 138 mmHg     Is BP treated: No     HDL Cholesterol: 48.1 mg/dL     Total Cholesterol: 193 mg/dL       Rosacea    Sees derm.      Other  Visit Diagnoses     Special screening for malignant neoplasms, colon       Relevant Orders   Ambulatory referral to Gastroenterology        No orders of the defined types were placed in this encounter.   Orders Placed This Encounter  Procedures   Ambulatory referral to Gastroenterology    Referral Priority:   Routine    Referral Type:   Consultation    Referral Reason:   Specialty Services Required    Number of Visits Requested:   1    Patient Instructions  We will refer you to GI for repeat colonoscopy  Good to see you today Return as needed or in 1 year for next physical   Follow up plan: Return in about 1 year (around 10/30/2023) for annual exam, prior fasting for blood work.  [name], MD

## 2022-10-30 NOTE — Patient Instructions (Addendum)
We will refer you to GI for repeat colonoscopy  Good to see you today Return as needed or in 1 year for next physical

## 2022-10-30 NOTE — Assessment & Plan Note (Signed)
Preventative protocols reviewed and updated unless pt declined. Discussed healthy diet and lifestyle.  

## 2022-10-30 NOTE — Assessment & Plan Note (Signed)
Encouraged healthy diet to improve triglyceride control. The 10-year ASCVD risk score (Arnett DK, et al., 2019) is: 8.2%   Values used to calculate the score:     Age: 57 years     Sex: Male     Is Non-Hispanic African American: No     Diabetic: No     Tobacco smoker: No     Systolic Blood Pressure: 138 mmHg     Is BP treated: No     HDL Cholesterol: 48.1 mg/dL     Total Cholesterol: 193 mg/dL

## 2022-10-30 NOTE — Assessment & Plan Note (Addendum)
EKG stable from 2020.  Declines coronary CT and calcium score Check Lp(a) next labs.

## 2022-10-30 NOTE — Assessment & Plan Note (Signed)
Sees derm

## 2022-11-01 LAB — LIPOPROTEIN A (LPA): Lipoprotein (a): <10 nmol/L (ref <75)

## 2022-11-13 ENCOUNTER — Ambulatory Visit (INDEPENDENT_AMBULATORY_CARE_PROVIDER_SITE_OTHER): Payer: BC Managed Care – PPO | Admitting: Psychology

## 2022-11-13 DIAGNOSIS — F319 Bipolar disorder, unspecified: Secondary | ICD-10-CM

## 2022-11-13 NOTE — Progress Notes (Signed)
Name: Erik Smith Date: 11/13/2022 MRN: [id number] DOB: 1965-06-29 PCP: [name], MD     [name] participated from home, via video, and consented to treatment. Therapist participated from home office.   Guardian/Payee:  N/A    Paperwork requested: No   Reason for Visit /Presenting Problem: Improve/manage feeling of agitation  Mental Status Exam: Appearance:   Fairly Groomed     Behavior:  Appropriate  Motor:  Normal  Speech/Language:   Normal Rate  Affect:  Appropriate  Mood:  normal  Thought process:  normal  Thought content:    WNL  Sensory/Perceptual disturbances:    WNL  Orientation:  oriented to person, place, and situation  Attention:  Good  Concentration:  Good  Memory:  WNL  Fund of knowledge:   Good  Insight:    Good  Judgment:   Good  Impulse Control:  Good     Reported Symptoms:  Agitation, frustration, worry  Risk Assessment: Danger to Self:  No Self-injurious Behavior: No Danger to Others: No Duty to Warn:no Physical Aggression / Violence:No  Access to Firearms a concern:  unknown Gang Involvement:No  Patient / guardian was educated about steps to take if suicide or homicide risk level increases between visits: n/a While future psychiatric events cannot be accurately predicted, the patient does not currently require acute inpatient psychiatric care and does not currently meet [facility] involuntary commitment criteria.  Substance Abuse History: Current substance abuse: No     Past Psychiatric History:   Previous psychological history is significant for Bipolar Mania Outpatient Providers:Dr. Evalina Field History of Psych Hospitalization: Yes  Psychological Testing:  N/A    Abuse History:  Victim of: No.,  N/A    Report needed: No. Victim of Neglect:No. Perpetrator of  N/A   Witness / Exposure to Domestic  Violence: No   Protective Services Involvement: No  Witness to [employer] Violence:  No   Family History:  Family History  Problem Relation Age of Onset   Coronary artery disease Father 57   Bladder Cancer Father    Sickle cell anemia Mother    Asthma Mother        smoker   Prostate cancer Paternal Grandfather 72       deceased from prostate CA   Lung cancer Paternal Grandmother 47       smoker   Breast cancer Paternal Uncle    Stroke Neg Hx    Diabetes Neg Hx    Colon cancer Neg Hx    Colon polyps Neg Hx    Esophageal cancer Neg Hx    Rectal cancer Neg Hx    Stomach cancer Neg Hx     Living situation: the patient lives with their spouse  Sexual Orientation: Straight  Relationship Status: married  Name of spouse / other:[name] If a parent, number of children / ages:2 boys, 47 & 15  Support Systems: family  Financial Stress:  No   Income/Employment/Disability: Employment  Financial planner:  unknown  Educational History: Education: post Engineer, maintenance (IT) work or degree  Religion/Sprituality/World View: unknown  Any cultural differences that may affect / interfere with treatment:  not applicable   Recreation/Hobbies: woodworking  Stressors: Occupational concerns    Strengths: Family  Barriers:  unknown   Legal History: Pending legal issue / charges: The patient has no significant history of legal issues. History of legal issue / charges:  N/A  Medical History/Surgical History: not reviewed Past Medical History:  Diagnosis Date   Arthritis    Asthma    as a child   Bipolar disorder, unspecified (HCC)    hospitalization 2004 for depression, bipolar, ambien overdose   Cancer (HCC) 10/20/2015   skin ca on nose removed   Cholesteatoma 05/2010   s/p R ear surgery, residual tinnitus, planning on L ear surgery   Depression    bipolar   Family history of ischemic heart disease    History of asthma    Migraine without aura, without mention of intractable  migraine without mention of status migrainosus    Rosacea 2017   Spinal stenosis    Unspecified adjustment reaction     Past Surgical History:  Procedure Laterality Date   CARPOMETACARPEL SUSPENSION PLASTY Left 10/18/2017   Procedure: LEFT THUMB TRAPEZIECTOMY AND SUSPENSIONPLASTY;  Surgeon: [name], MD;  Location: [facility];  Service: Orthopedics;  Laterality: Left;   CARPOMETACARPEL SUSPENSION PLASTY Right 03/28/2018   Procedure: CARPOMETACARPEL [facility]) SUSPENSION PLASTY AND TRAPEZIECTOMY;  Surgeon: [name], MD;  Location: [facility];  Service: Orthopedics;  Laterality: Right;   COLONOSCOPY  1999   normal (in [place])   ESOPHAGUS SURGERY  2003   torn esophagus   INNER EAR SURGERY  1975, 1995, 2012, 2013   for cholesteatoma, bilateral first L then R   NECK SURGERY  2002   spinal stenosis and arthritis   perianal abscess  1998    Medications: Current Outpatient Medications  Medication Sig Dispense Refill   ARIPiprazole (ABILIFY) 2 MG tablet Take 2 mg by mouth at bedtime.     doxycycline (VIBRA-TABS) 100 MG tablet Take 100 mg by mouth 2 (two) times daily. As needed     fluticasone (FLONASE) 50 MCG/ACT nasal spray Place 2 sprays into both nostrils daily. 16 g 0   ibuprofen (ADVIL) 200 MG tablet Take 3 tablets (600 mg total) by mouth 2 (two) times daily as needed for moderate pain.     lamoTRIgine (LAMICTAL) 100 MG tablet Take 1 tablet (100 mg total) by mouth 2 (two) times daily.     levocetirizine (XYZAL) 5 MG tablet Take 1 tablet (5 mg total) by mouth every evening. 30 tablet 0   valACYclovir (VALTREX) 500 MG tablet TAKE 1 TABLET BY MOUTH TWICE DAILY 3 DAYS AT A TIME AS NEEDED 30 tablet 1   No current facility-administered medications for this visit.    Allergies  Allergen Reactions   Morphine And Codeine Itching            Goals/Plan: Patient is seeking counseling for mood stabilization. Will utilize cognitive strategies to help  manage feelings of anxiety and despair. Revised target date is 12-24 Monitor medication compliance -Ongoing. Session was virtual (video) at patient request and is aware of platform limitations. He is at home and provider in home office.   Patient requested a video Public affairs consultant) meeting. He is at home and provider is in home office.  Session Notes: Antjuan says his mother's evaluation was completed and his brother keeps "demanding" the results. One brother was questioning whether he would actually share the results of the testing with his sibs. The results of the test  were better than anticipated. His brothers are giving him a hard time and making it sound as though their mother is incompetent and not paying her bills. He is very frustrated with brothers and unclear of next step. He also found out that he did not get 2 of the jobs that he interviewed for. Will meet with some principles he knows and ask them to interview him and give feedback.  States the he and his wife have met with a couples counselor because they have had previous failed marriages and they do not want to make mistakes they made in the past. They are only 2 weeks in, but he is optimistic it will help the relationship.                                                                                 Diagnoses:  Bi Polar Disorder  Plan of Care: Outpatient Therapy and psychotropic medication   Garrel Ridgel, PhD Time:4:15p-5:00p 45 min.

## 2022-11-27 ENCOUNTER — Ambulatory Visit (INDEPENDENT_AMBULATORY_CARE_PROVIDER_SITE_OTHER): Payer: BC Managed Care – PPO | Admitting: Psychology

## 2022-11-27 DIAGNOSIS — F319 Bipolar disorder, unspecified: Secondary | ICD-10-CM | POA: Diagnosis not present

## 2022-11-27 NOTE — Progress Notes (Signed)
Name: Cordaro Mukai Date: 11/27/2022 MRN: [id number] DOB: 08/13/1965 PCP: [name], MD     [name] participated from home, via video, and consented to treatment. Therapist participated from home office.   Guardian/Payee:  N/A    Paperwork requested: No   Reason for Visit /Presenting Problem: Improve/manage feeling of agitation  Mental Status Exam: Appearance:   Fairly Groomed     Behavior:  Appropriate  Motor:  Normal  Speech/Language:   Normal Rate  Affect:  Appropriate  Mood:  normal  Thought process:  normal  Thought content:    WNL  Sensory/Perceptual disturbances:    WNL  Orientation:  oriented to person, place, and situation  Attention:  Good  Concentration:  Good  Memory:  WNL  Fund of knowledge:   Good  Insight:    Good  Judgment:   Good  Impulse Control:  Good     Reported Symptoms:  Agitation, frustration, worry  Risk Assessment: Danger to Self:  No Self-injurious Behavior: No Danger to Others: No Duty to Warn:no Physical Aggression / Violence:No  Access to Firearms a concern:  unknown Gang Involvement:No  Patient / guardian was educated about steps to take if suicide or homicide risk level increases between visits: n/a While future psychiatric events cannot be accurately predicted, the patient does not currently require acute inpatient psychiatric care and does not currently meet [facility] involuntary commitment criteria.  Substance Abuse History: Current substance abuse: No     Past Psychiatric History:   Previous psychological history is significant for Bipolar Mania Outpatient Providers:Dr. Evalina Field History of Psych Hospitalization: Yes  Psychological Testing:  N/A    Abuse History:  Victim of: No.,  N/A    Report needed: No. Victim of Neglect:No. Perpetrator of  N/A    Witness / Exposure to Domestic Violence: No   Protective Services Involvement: No  Witness to [employer] Violence:  No   Family History:  Family History  Problem Relation Age of Onset   Coronary artery disease Father 70   Bladder Cancer Father    Sickle cell anemia Mother    Asthma Mother        smoker   Prostate cancer Paternal Grandfather 24       deceased from prostate CA   Lung cancer Paternal Grandmother 55       smoker   Breast cancer Paternal Uncle    Stroke Neg Hx    Diabetes Neg Hx    Colon cancer Neg Hx    Colon polyps Neg Hx    Esophageal cancer Neg Hx    Rectal cancer Neg Hx    Stomach cancer Neg Hx     Living situation: the patient lives with their spouse  Sexual Orientation: Straight  Relationship Status: married  Name of spouse / other:[name] If a parent, number of children / ages:2 boys, 57 & 15  Support Systems: family  Financial Stress:  No   Income/Employment/Disability: Employment  Financial planner:  unknown  Educational History: Education: post Engineer, maintenance (IT) work or degree  Religion/Sprituality/World View: unknown  Any cultural differences that may affect / interfere with treatment:  not applicable   Recreation/Hobbies: woodworking  Stressors: Occupational concerns    Strengths: Family  Barriers:  unknown   Legal History: Pending legal issue / charges: The patient has no significant history of legal issues. History of legal issue / charges:  N/A  Medical History/Surgical History: not reviewed Past Medical History:  Diagnosis Date   Arthritis    Asthma    as a child   Bipolar disorder, unspecified (HCC)    hospitalization 2004 for depression, bipolar, ambien overdose   Cancer (HCC) 10/20/2015   skin ca on nose removed   Cholesteatoma 05/2010   s/p R ear surgery, residual tinnitus, planning on L ear surgery   Depression    bipolar   Family history of ischemic heart disease    History of asthma    Migraine without  aura, without mention of intractable migraine without mention of status migrainosus    Rosacea 2017   Spinal stenosis    Unspecified adjustment reaction     Past Surgical History:  Procedure Laterality Date   CARPOMETACARPEL SUSPENSION PLASTY Left 10/18/2017   Procedure: LEFT THUMB TRAPEZIECTOMY AND SUSPENSIONPLASTY;  Surgeon: [name], MD;  Location: [facility];  Service: Orthopedics;  Laterality: Left;   CARPOMETACARPEL SUSPENSION PLASTY Right 03/28/2018   Procedure: CARPOMETACARPEL [facility]) SUSPENSION PLASTY AND TRAPEZIECTOMY;  Surgeon: [name], MD;  Location: [facility];  Service: Orthopedics;  Laterality: Right;   COLONOSCOPY  1999   normal (in [place])   ESOPHAGUS SURGERY  2003   torn esophagus   INNER EAR SURGERY  1975, 1995, 2012, 2013   for cholesteatoma, bilateral first L then R   NECK SURGERY  2002   spinal stenosis and arthritis   perianal abscess  1998    Medications: Current Outpatient Medications  Medication Sig Dispense Refill   ARIPiprazole (ABILIFY) 2 MG tablet Take 2 mg by mouth at bedtime.     doxycycline (VIBRA-TABS) 100 MG tablet Take 100 mg by mouth 2 (two) times daily. As needed     fluticasone (FLONASE) 50 MCG/ACT nasal spray Place 2 sprays into both nostrils daily. 16 g 0   ibuprofen (ADVIL) 200 MG tablet Take 3 tablets (600 mg total) by mouth 2 (two) times daily as needed for moderate pain.     lamoTRIgine (LAMICTAL) 100 MG tablet Take 1 tablet (100 mg total) by mouth 2 (two) times daily.     levocetirizine (XYZAL) 5 MG tablet Take 1 tablet (5 mg total) by mouth every evening. 30 tablet 0   valACYclovir (VALTREX) 500 MG tablet TAKE 1 TABLET BY MOUTH TWICE DAILY 3 DAYS AT A TIME AS NEEDED 30 tablet 1   No current facility-administered medications for this visit.    Allergies  Allergen Reactions   Morphine And Codeine Itching            Goals/Plan: Patient is seeking counseling for mood stabilization. Will  utilize cognitive strategies to help manage feelings of anxiety and despair. Revised target date is 12-24 Monitor medication compliance -Ongoing. Session was virtual (video) at patient request and is aware of platform limitations. He is at home and provider in home office.   Patient agrees to a video Public affairs consultant) meeting and is aware of platform limitations. He is ata home and provider is in home office.  Session Notes: Rickardo says he and his wife just returned from [place] and brought back mother's  car. He has had interviews with his principals to get interview help/feedback. They told him he isn't doing anything wrong and said he had good answers. He will proceed with applying to more schools. Maintaining a positive attitude. Is managing household issues and projects. Reports getting along with wife and having stable moods.                                                                                   Diagnoses:  Bi Polar Disorder  Plan of Care: Outpatient Therapy and psychotropic medication   Garrel Ridgel, PhD Time:4:15p-5:00p 45 min.

## 2022-12-11 ENCOUNTER — Ambulatory Visit: Payer: BC Managed Care – PPO | Admitting: Psychology

## 2022-12-25 ENCOUNTER — Ambulatory Visit (INDEPENDENT_AMBULATORY_CARE_PROVIDER_SITE_OTHER): Payer: BC Managed Care – PPO | Admitting: Psychology

## 2022-12-25 DIAGNOSIS — F319 Bipolar disorder, unspecified: Secondary | ICD-10-CM | POA: Diagnosis not present

## 2022-12-25 NOTE — Progress Notes (Signed)
Name: Erik Smith Date: 12/25/2022 MRN: [id number] DOB: 1965-09-10 PCP: Erik Boyden, MD     Erik Smith participated from home, via video, and consented to treatment. Therapist participated from home office.   Guardian/Payee:  N/A    Paperwork requested: No   Reason for Visit /Presenting Problem: Improve/manage feeling of agitation  Mental Status Exam: Appearance:   Fairly Groomed     Behavior:  Appropriate  Motor:  Normal  Speech/Language:   Normal Rate  Affect:  Appropriate  Mood:  normal  Thought process:  normal  Thought content:    WNL  Sensory/Perceptual disturbances:    WNL  Orientation:  oriented to person, place, and situation  Attention:  Good  Concentration:  Good  Memory:  WNL  Fund of knowledge:   Good  Insight:    Good  Judgment:   Good  Impulse Control:  Good     Reported Symptoms:  Agitation, frustration, worry  Risk Assessment: Danger to Self:  No Self-injurious Behavior: No Danger to Others: No Duty to Warn:no Physical Aggression / Violence:No  Access to Firearms a concern:  unknown Gang Involvement:No  Patient / guardian was educated about steps to take if suicide or homicide risk level increases between visits: n/a While future psychiatric events cannot be accurately predicted, the patient does not currently require acute inpatient psychiatric care and does not currently meet [facility] involuntary commitment criteria.  Substance Abuse History: Current substance abuse: No     Past Psychiatric History:   Previous psychological history is significant for Bipolar Mania Outpatient Providers:Dr. Evalina Smith History of Psych Hospitalization: Yes  Psychological Testing:  N/A    Abuse History:  Victim of: No.,  N/A    Report needed: No. Victim of  Neglect:No. Perpetrator of  N/A   Witness / Exposure to Domestic Violence: No   Protective Services Involvement: No  Witness to [employer] Violence:  No   Family History:  Family History  Problem Relation Age of Onset   Coronary artery disease Father 22   Bladder Cancer Father    Sickle cell anemia Mother    Asthma Mother        smoker   Prostate cancer Paternal Grandfather 71       deceased from prostate CA   Lung cancer Paternal Grandmother 14       smoker   Breast cancer Paternal Uncle    Stroke Neg Hx    Diabetes Neg Hx    Colon cancer Neg Hx    Colon polyps Neg Hx    Esophageal cancer Neg Hx    Rectal cancer Neg Hx    Stomach cancer Neg Hx     Living situation: the patient lives with their spouse  Sexual Orientation: Straight  Relationship Status: married  Name of spouse / other:Erik Smith If a parent, number of children / ages:2 boys, 18 & 15  Support Systems: family  Financial Stress:  No   Income/Employment/Disability: Employment  Military Service:  unknown  Educational History: Education: post Engineer, maintenance (IT) work or Engineer, technical sales: unknown  Any cultural differences that may affect / interfere with treatment:  not applicable   Recreation/Hobbies: woodworking  Stressors: Occupational concerns    Strengths: Family  Barriers:  unknown   Legal History: Pending legal issue / charges: The patient has no significant history of legal issues. History of legal issue / charges:  N/A  Medical History/Surgical History: not reviewed Past Medical History:  Diagnosis Date   Arthritis    Asthma    as a child   Bipolar disorder, unspecified (HCC)    hospitalization 2004 for depression, bipolar, ambien overdose   Cancer (HCC) 10/20/2015   skin ca on nose removed   Cholesteatoma 05/2010   s/p R ear surgery, residual tinnitus, planning on L ear surgery   Depression    bipolar   Family history of ischemic heart disease    History  of asthma    Migraine without aura, without mention of intractable migraine without mention of status migrainosus    Rosacea 2017   Spinal stenosis    Unspecified adjustment reaction     Past Surgical History:  Procedure Laterality Date   CARPOMETACARPEL SUSPENSION PLASTY Left 10/18/2017   Procedure: LEFT THUMB TRAPEZIECTOMY AND SUSPENSIONPLASTY;  Surgeon: [name], MD;  Location: [facility];  Service: Orthopedics;  Laterality: Left;   CARPOMETACARPEL SUSPENSION PLASTY Right 03/28/2018   Procedure: CARPOMETACARPEL [facility]) SUSPENSION PLASTY AND TRAPEZIECTOMY;  Surgeon: [name], MD;  Location: [facility];  Service: Orthopedics;  Laterality: Right;   COLONOSCOPY  1999   normal (in [place])   ESOPHAGUS SURGERY  2003   torn esophagus   INNER EAR SURGERY  1975, 1995, 2012, 2013   for cholesteatoma, bilateral first L then R   NECK SURGERY  2002   spinal stenosis and arthritis   perianal abscess  1998    Medications: Current Outpatient Medications  Medication Sig Dispense Refill   ARIPiprazole (ABILIFY) 2 MG tablet Take 2 mg by mouth at bedtime.     doxycycline (VIBRA-TABS) 100 MG tablet Take 100 mg by mouth 2 (two) times daily. As needed     fluticasone (FLONASE) 50 MCG/ACT nasal spray Place 2 sprays into both nostrils daily. 16 g 0   ibuprofen (ADVIL) 200 MG tablet Take 3 tablets (600 mg total) by mouth 2 (two) times daily as needed for moderate pain.     lamoTRIgine (LAMICTAL) 100 MG tablet Take 1 tablet (100 mg total) by mouth 2 (two) times daily.     levocetirizine (XYZAL) 5 MG tablet Take 1 tablet (5 mg total) by mouth every evening. 30 tablet 0   valACYclovir (VALTREX) 500 MG tablet TAKE 1 TABLET BY MOUTH TWICE DAILY 3 DAYS AT A TIME AS NEEDED 30 tablet 1   No current facility-administered medications for this visit.    Allergies  Allergen Reactions   Morphine And Codeine Itching            Goals/Plan: Patient is seeking  counseling for mood stabilization. Will utilize cognitive strategies to help manage feelings of anxiety and despair. Revised target date is 12-24 Monitor medication compliance -Ongoing. Session was virtual (video) at patient request and is aware of platform limitations. He is at home and provider in home office.   Patient agrees to a video Public affairs consultant) meeting and is aware of platform limitations. He is ata home and provider is in home office.  Session  Notes: Tirell says he has started back to work and the kids will start next week. 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 In a very good mood and feeling more positive about his future. He states that his dad called and invited he and his wife to join him on a 7 day cruise. He told Tobe it would cost $2700 for 2 of them. He is planning to go on the trip. Moods are stable and he reports the "weight is off" his shoulders.                                                                                     Diagnoses:  Bi Polar Disorder  Plan of Care: Outpatient Therapy and psychotropic medication   Garrel Ridgel, PhD Time:4:15p-5:00p 45 min.

## 2023-01-22 ENCOUNTER — Ambulatory Visit: Payer: BC Managed Care – PPO | Admitting: Psychology

## 2023-02-05 ENCOUNTER — Ambulatory Visit (INDEPENDENT_AMBULATORY_CARE_PROVIDER_SITE_OTHER): Payer: BC Managed Care – PPO | Admitting: Psychology

## 2023-02-05 DIAGNOSIS — F319 Bipolar disorder, unspecified: Secondary | ICD-10-CM

## 2023-02-05 NOTE — Progress Notes (Signed)
Name: Erik Smith Date: 02/05/2023 MRN: [id number] DOB: [birth year] PCP: [name], MD     [name] participated from home, via video, and consented to treatment. Therapist participated from home office.   Guardian/Payee:  N/A    Paperwork requested: No   Reason for Visit /Presenting Problem: Improve/manage feeling of agitation  Mental Status Exam: Appearance:   Fairly Groomed     Behavior:  Appropriate  Motor:  Normal  Speech/Language:   Normal Rate  Affect:  Appropriate  Mood:  normal  Thought process:  normal  Thought content:    WNL  Sensory/Perceptual disturbances:    WNL  Orientation:  oriented to person, place, and situation  Attention:  Good  Concentration:  Good  Memory:  WNL  Fund of knowledge:   Good  Insight:    Good  Judgment:   Good  Impulse Control:  Good     Reported Symptoms:  Agitation, frustration, worry  Risk Assessment: Danger to Self:  No Self-injurious Behavior: No Danger to Others: No Duty to Warn:no Physical Aggression / Violence:No  Access to Firearms a concern:  unknown Gang Involvement:No  Patient / guardian was educated about steps to take if suicide or homicide risk level increases between visits: n/a While future psychiatric events cannot be accurately predicted, the patient does not currently require acute inpatient psychiatric care and does not currently meet [facility] involuntary commitment criteria.  Substance Abuse History: Current substance abuse: No     Past Psychiatric History:   Previous psychological history is significant for Bipolar Mania Outpatient Providers:Dr. Evalina Field History of Psych Hospitalization: Yes  Psychological Testing:  N/A    Abuse History:  Victim of: No.,  N/A     Report needed: No. Victim of Neglect:No. Perpetrator of  N/A   Witness / Exposure to Domestic Violence: No   Protective Services Involvement: No  Witness to [employer] Violence:  No   Family History:  Family History  Problem Relation Age of Onset   Coronary artery disease Father 52   Bladder Cancer Father    Sickle cell anemia Mother    Asthma Mother        smoker   Prostate cancer Paternal Grandfather 60       deceased from prostate CA   Lung cancer Paternal Grandmother 49       smoker   Breast cancer Paternal Uncle    Stroke Neg Hx    Diabetes Neg Hx    Colon cancer Neg Hx    Colon polyps Neg Hx    Esophageal cancer Neg Hx    Rectal cancer Neg Hx    Stomach cancer Neg Hx     Living situation: the patient lives with their spouse  Sexual Orientation: Straight  Relationship Status: married  Name of spouse / other:[name] If a parent, number of children / ages:2 boys, 53 &  15  Support Systems: family  Financial Stress:  No   Income/Employment/Disability: Employment  Financial planner:  unknown  Educational History: Education: post Engineer, maintenance (IT) work or Engineer, technical sales: unknown  Any cultural differences that may affect / interfere with treatment:  not applicable   Recreation/Hobbies: woodworking  Stressors: Occupational concerns    Strengths: Family  Barriers:  unknown   Legal History: Pending legal issue / charges: The patient has no significant history of legal issues. History of legal issue / charges:  N/A  Medical History/Surgical History: not reviewed Past Medical History:  Diagnosis Date   Arthritis    Asthma    as a child   Bipolar disorder, unspecified (HCC)    hospitalization 2004 for depression, bipolar, ambien overdose   Cancer (HCC) 10/20/2015   skin ca on nose removed   Cholesteatoma 05/2010   s/p R ear surgery, residual tinnitus, planning on L ear surgery   Depression    bipolar   Family history of  ischemic heart disease    History of asthma    Migraine without aura, without mention of intractable migraine without mention of status migrainosus    Rosacea 2017   Spinal stenosis    Unspecified adjustment reaction     Past Surgical History:  Procedure Laterality Date   CARPOMETACARPEL SUSPENSION PLASTY Left 10/18/2017   Procedure: LEFT THUMB TRAPEZIECTOMY AND SUSPENSIONPLASTY;  Surgeon: [name], MD;  Location: [facility];  Service: Orthopedics;  Laterality: Left;   CARPOMETACARPEL SUSPENSION PLASTY Right 03/28/2018   Procedure: CARPOMETACARPEL [facility]) SUSPENSION PLASTY AND TRAPEZIECTOMY;  Surgeon: [name], MD;  Location: [facility];  Service: Orthopedics;  Laterality: Right;   COLONOSCOPY  1999   normal (in [place])   ESOPHAGUS SURGERY  2003   torn esophagus   INNER EAR SURGERY  1975, 1995, 2012, 2013   for cholesteatoma, bilateral first L then R   NECK SURGERY  2002   spinal stenosis and arthritis   perianal abscess  1998    Medications: Current Outpatient Medications  Medication Sig Dispense Refill   ARIPiprazole (ABILIFY) 2 MG tablet Take 2 mg by mouth at bedtime.     doxycycline (VIBRA-TABS) 100 MG tablet Take 100 mg by mouth 2 (two) times daily. As needed     fluticasone (FLONASE) 50 MCG/ACT nasal spray Place 2 sprays into both nostrils daily. 16 g 0   ibuprofen (ADVIL) 200 MG tablet Take 3 tablets (600 mg total) by mouth 2 (two) times daily as needed for moderate pain.     lamoTRIgine (LAMICTAL) 100 MG tablet Take 1 tablet (100 mg total) by mouth 2 (two) times daily.     levocetirizine (XYZAL) 5 MG tablet Take 1 tablet (5 mg total) by mouth every evening. 30 tablet 0   valACYclovir (VALTREX) 500 MG tablet TAKE 1 TABLET BY MOUTH TWICE DAILY 3 DAYS AT A TIME AS NEEDED 30 tablet 1   No current facility-administered medications for this visit.    Allergies  Allergen Reactions   Morphine And Codeine Itching             Goals/Plan: Patient is seeking counseling for mood stabilization. Will utilize cognitive strategies to help manage feelings of anxiety and despair. Revised target date is 12-24 Monitor medication compliance -Ongoing. Session was virtual (video) at patient request and is aware of platform limitations. He is at home and provider in home office.   Patient agrees to a video Public affairs consultant) meeting and is aware  of platform limitations. He is ata home and provider is in home office.  Session Notes: Jamar is in [place] taking back his stepson's dog, who he was taking care of while they traveled. He says mother in law fell and that was reason for having to cancel the last appointment. He says his back is feeling better and he is successfully losing weight. Son is doing well at school. No prospects for a job, but he is maintaining composure and trying to stay positive.  Reports that his moods are stable. Realtionship is strong and he is feeling supported.                                                                                        Diagnoses:  Bi Polar Disorder  Plan of Care: Outpatient Therapy and psychotropic medication   Garrel Ridgel, PhD Time:4:15p-5:00p 45 min.

## 2023-02-19 ENCOUNTER — Ambulatory Visit (INDEPENDENT_AMBULATORY_CARE_PROVIDER_SITE_OTHER): Payer: BC Managed Care – PPO | Admitting: Psychology

## 2023-02-19 DIAGNOSIS — F319 Bipolar disorder, unspecified: Secondary | ICD-10-CM

## 2023-02-19 NOTE — Progress Notes (Signed)
Name: Erik Smith Date: 02/19/2023 MRN: [id number] DOB: 02-17-1966 PCP: Erik Boyden, MD     Erik Smith participated from home, via video, and consented to treatment. Therapist participated from home office.   Guardian/Payee:  N/A    Paperwork requested: No   Reason for Visit /Presenting Problem: Improve/manage feeling of agitation  Mental Status Exam: Appearance:   Fairly Groomed     Behavior:  Appropriate  Motor:  Normal  Speech/Language:   Normal Rate  Affect:  Appropriate  Mood:  normal  Thought process:  normal  Thought content:    WNL  Sensory/Perceptual disturbances:    WNL  Orientation:  oriented to person, place, and situation  Attention:  Good  Concentration:  Good  Memory:  WNL  Fund of knowledge:   Good  Insight:    Good  Judgment:   Good  Impulse Control:  Good     Reported Symptoms:  Agitation, frustration, worry  Risk Assessment: Danger to Self:  No Self-injurious Behavior: No Danger to Others: No Duty to Warn:no Physical Aggression / Violence:No  Access to Firearms a concern:  unknown Gang Involvement:No  Patient / guardian was educated about steps to take if suicide or homicide risk level increases between visits: n/a While future psychiatric events cannot be accurately predicted, the patient does not currently require acute inpatient psychiatric care and does not currently meet [facility] involuntary commitment criteria.  Substance Abuse History: Current substance abuse: No     Past Psychiatric History:   Previous psychological history is significant for Bipolar Mania Outpatient Providers:Dr. Evalina Smith History of Psych Hospitalization: Yes  Psychological Testing:  N/A    Abuse History:   Victim of: No.,  N/A    Report needed: No. Victim of Neglect:No. Perpetrator of  N/A   Witness / Exposure to Domestic Violence: No   Protective Services Involvement: No  Witness to [employer] Violence:  No   Family History:  Family History  Problem Relation Age of Onset   Coronary artery disease Father 61   Bladder Cancer Father    Sickle cell anemia Mother    Asthma Mother        smoker   Prostate cancer Paternal Grandfather 31       deceased from prostate CA   Lung cancer Paternal Grandmother 46       smoker   Breast cancer Paternal Uncle    Stroke Neg Hx    Diabetes Neg Hx    Colon cancer Neg Hx    Colon polyps Neg Hx    Esophageal cancer Neg Hx    Rectal cancer Neg Hx    Stomach cancer Neg Hx     Living situation: the patient lives with their spouse  Sexual Orientation: Straight  Relationship Status: married  Name of spouse / other:[name] If a parent, number of children / ages:2 boys, 43 & 15  Support Systems: family  Financial Stress:  No   Income/Employment/Disability: Employment  Financial planner:  unknown  Educational History: Education: post Engineer, maintenance (IT) work or Engineer, technical sales: unknown  Any cultural differences that may affect / interfere with treatment:  not applicable   Recreation/Hobbies: woodworking  Stressors: Occupational concerns    Strengths: Family  Barriers:  unknown   Legal History: Pending legal issue / charges: The patient has no significant history of legal issues. History of legal issue / charges:  N/A  Medical History/Surgical History: not reviewed Past Medical History:  Diagnosis Date   Arthritis    Asthma    as a child   Bipolar disorder, unspecified (HCC)    hospitalization 2004 for depression, bipolar, ambien overdose   Cancer (HCC) 10/20/2015   skin ca on nose removed   Cholesteatoma 05/2010   s/p R ear surgery, residual tinnitus, planning on L ear surgery   Depression    bipolar    Family history of ischemic heart disease    History of asthma    Migraine without aura, without mention of intractable migraine without mention of status migrainosus    Rosacea 2017   Spinal stenosis    Unspecified adjustment reaction     Past Surgical History:  Procedure Laterality Date   CARPOMETACARPEL SUSPENSION PLASTY Left 10/18/2017   Procedure: LEFT THUMB TRAPEZIECTOMY AND SUSPENSIONPLASTY;  Surgeon: Erik Loa, MD;  Location: [facility];  Service: Orthopedics;  Laterality: Left;   CARPOMETACARPEL SUSPENSION PLASTY Right 03/28/2018   Procedure: CARPOMETACARPEL [facility]) SUSPENSION PLASTY AND TRAPEZIECTOMY;  Surgeon: Erik Loa, MD;  Location: [facility];  Service: Orthopedics;  Laterality: Right;   COLONOSCOPY  1999   normal (in [place])   ESOPHAGUS SURGERY  2003   torn esophagus   INNER EAR SURGERY  1975, 1995, 2012, 2013   for cholesteatoma, bilateral first L then R   NECK SURGERY  2002   spinal stenosis and arthritis   perianal abscess  1998    Medications: Current Outpatient Medications  Medication Sig Dispense Refill   ARIPiprazole (ABILIFY) 2 MG tablet Take 2 mg by mouth at bedtime.     doxycycline (VIBRA-TABS) 100 MG tablet Take 100 mg by mouth 2 (two) times daily. As needed     fluticasone (FLONASE) 50 MCG/ACT nasal spray Place 2 sprays into both nostrils daily. 16 g 0   ibuprofen (ADVIL) 200 MG tablet Take 3 tablets (600 mg total) by mouth 2 (two) times daily as needed for moderate pain.     lamoTRIgine (LAMICTAL) 100 MG tablet Take 1 tablet (100 mg total) by mouth 2 (two) times daily.     levocetirizine (XYZAL) 5 MG tablet Take 1 tablet (5 mg total) by mouth every evening. 30 tablet 0   valACYclovir (VALTREX) 500 MG tablet TAKE 1 TABLET BY MOUTH TWICE DAILY 3 DAYS AT A TIME AS NEEDED 30 tablet 1   No current facility-administered medications for this visit.    Allergies  Allergen Reactions   Morphine And Codeine Itching             Goals/Plan: Patient is seeking counseling for mood stabilization. Will utilize cognitive strategies to help manage feelings of anxiety and despair. Revised target date is 12-24 Monitor medication compliance -Ongoing. Session was virtual (video) at patient request and is aware of platform limitations. He is at home and  provider in home office.   Patient agrees to a video Public affairs consultant) meeting and is aware of platform limitations. He is at home and provider is in home office.  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 He is hopeful and feels the interview went well. He said that his step-daughter in [place] now has power and is doing well. His 2nd anniversary is tomorrow and says relationship is going well.  His mother maintains that she is going to buy a car, but she doesn't have a license. Her doctor had her license revoked. Jamesen is feeling good and stable.                                                                                            Diagnoses:  Bi Polar Disorder  Plan of Care: Outpatient Therapy and psychotropic medication   Garrel Ridgel, PhD Time:4:15p-5:00p 45 min.

## 2023-03-05 ENCOUNTER — Ambulatory Visit (INDEPENDENT_AMBULATORY_CARE_PROVIDER_SITE_OTHER): Payer: BC Managed Care – PPO | Admitting: Psychology

## 2023-03-05 DIAGNOSIS — F319 Bipolar disorder, unspecified: Secondary | ICD-10-CM | POA: Diagnosis not present

## 2023-03-05 NOTE — Progress Notes (Signed)
Name: Erik Smith Date: 03/05/2023 MRN: [id number] DOB: [birth year] PCP: Erik Boyden, MD     Erik Smith participated from home, via video, and consented to treatment. Therapist participated from home office.   Guardian/Payee:  N/A    Paperwork requested: No   Reason for Visit /Presenting Problem: Improve/manage feeling of agitation  Mental Status Exam: Appearance:   Fairly Groomed     Behavior:  Appropriate  Motor:  Normal  Speech/Language:   Normal Rate  Affect:  Appropriate  Mood:  normal  Thought process:  normal  Thought content:    WNL  Sensory/Perceptual disturbances:    WNL  Orientation:  oriented to person, place, and situation  Attention:  Good  Concentration:  Good  Memory:  WNL  Fund of knowledge:   Good  Insight:    Good  Judgment:   Good  Impulse Control:  Good     Reported Symptoms:  Agitation, frustration, worry  Risk Assessment: Danger to Self:  No Self-injurious Behavior: No Danger to Others: No Duty to Warn:no Physical Aggression / Violence:No  Access to Firearms a concern:  unknown Gang Involvement:No  Patient / guardian was educated about steps to take if suicide or homicide risk level increases between visits: n/a While future psychiatric events cannot be accurately predicted, the patient does not currently require acute inpatient psychiatric care and does not currently meet [facility] involuntary commitment criteria.  Substance Abuse History: Current substance abuse: No     Past Psychiatric History:   Previous psychological history is significant for Bipolar Mania Outpatient Providers:Dr. Evalina Smith History of Psych Hospitalization: Yes  Psychological  Testing:  N/A    Abuse History:  Victim of: No.,  N/A    Report needed: No. Victim of Neglect:No. Perpetrator of  N/A   Witness / Exposure to Domestic Violence: No   Protective Services Involvement: No  Witness to [employer] Violence:  No   Family History:  Family History  Problem Relation Age of Onset   Coronary artery disease Father 26   Bladder Cancer Father    Sickle cell anemia Mother    Asthma Mother        smoker   Prostate cancer Paternal Grandfather 13       deceased from prostate CA   Lung cancer Paternal Grandmother 25       smoker   Breast cancer Paternal Uncle    Stroke Neg Hx    Diabetes Neg Hx    Colon cancer Neg Hx    Colon polyps Neg Hx    Esophageal cancer Neg Hx    Rectal cancer Neg Hx    Stomach cancer Neg Hx     Living situation:  the patient lives with their spouse  Sexual Orientation: Straight  Relationship Status: married  Name of spouse / other:Erik Smith If a parent, number of children / ages:2 boys, 12 & 15  Support Systems: family  Financial Stress:  No   Income/Employment/Disability: Employment  Financial planner:  unknown  Educational History: Education: post Engineer, maintenance (IT) work or Engineer, technical sales: unknown  Any cultural differences that may affect / interfere with treatment:  not applicable   Recreation/Hobbies: woodworking  Stressors: Occupational concerns    Strengths: Family  Barriers:  unknown   Legal History: Pending legal issue / charges: The patient has no significant history of legal issues. History of legal issue / charges:  N/A  Medical History/Surgical History: not reviewed Past Medical History:  Diagnosis Date   Arthritis    Asthma    as a child   Bipolar disorder, unspecified (HCC)    hospitalization 2004 for depression, bipolar, ambien overdose   Cancer (HCC) 10/20/2015   skin ca on nose removed   Cholesteatoma 05/2010   s/p R ear surgery, residual tinnitus, planning on L ear  surgery   Depression    bipolar   Family history of ischemic heart disease    History of asthma    Migraine without aura, without mention of intractable migraine without mention of status migrainosus    Rosacea 2017   Spinal stenosis    Unspecified adjustment reaction     Past Surgical History:  Procedure Laterality Date   CARPOMETACARPEL SUSPENSION PLASTY Left 10/18/2017   Procedure: LEFT THUMB TRAPEZIECTOMY AND SUSPENSIONPLASTY;  Surgeon: Erik Loa, MD;  Location: [facility];  Service: Orthopedics;  Laterality: Left;   CARPOMETACARPEL SUSPENSION PLASTY Right 03/28/2018   Procedure: CARPOMETACARPEL [facility]) SUSPENSION PLASTY AND TRAPEZIECTOMY;  Surgeon: Erik Loa, MD;  Location: [facility];  Service: Orthopedics;  Laterality: Right;   COLONOSCOPY  1999   normal (in [place])   ESOPHAGUS SURGERY  2003   torn esophagus   INNER EAR SURGERY  1975, 1995, 2012, 2013   for cholesteatoma, bilateral first L then R   NECK SURGERY  2002   spinal stenosis and arthritis   perianal abscess  1998    Medications: Current Outpatient Medications  Medication Sig Dispense Refill   ARIPiprazole (ABILIFY) 2 MG tablet Take 2 mg by mouth at bedtime.     doxycycline (VIBRA-TABS) 100 MG tablet Take 100 mg by mouth 2 (two) times daily. As needed     fluticasone (FLONASE) 50 MCG/ACT nasal spray Place 2 sprays into both nostrils daily. 16 g 0   ibuprofen (ADVIL) 200 MG tablet Take 3 tablets (600 mg total) by mouth 2 (two) times daily as needed for moderate pain.     lamoTRIgine (LAMICTAL) 100 MG tablet Take 1 tablet (100 mg total) by mouth 2 (two) times daily.     levocetirizine (XYZAL) 5 MG tablet Take 1 tablet (5 mg total) by mouth every evening. 30 tablet 0   valACYclovir (VALTREX) 500 MG tablet TAKE 1 TABLET BY MOUTH TWICE DAILY 3 DAYS AT A TIME AS NEEDED 30 tablet 1   No current facility-administered medications for this visit.    Allergies  Allergen Reactions    Morphine And Codeine Itching            Goals/Plan: Patient is seeking counseling for mood stabilization. Will utilize cognitive strategies to help manage feelings of anxiety and despair. Revised target date is 12-24 Monitor medication compliance -Ongoing. Session was virtual (  video) at patient request and is aware of platform limitations. He is at home and provider in home office.   Patient agrees to a video Public affairs consultant) meeting and is aware of platform limitations. He is at home and provider is in home office.  Session Notes: Javario says that a lot has been happening for him. After last interview, he was contacted and they had him come back to give him tour of school and interview again. Felt it went well, but did not hear back for over a week. He contacted them to check in, but assumed he did not get the position. 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 He is now waiting to hear from them. He and wife are talking about whether or not to sell their camper or move it to the beach. He feels that in a very short time, his circumstances could be changing significantly. He is in a very positive mind frame, but also trying to be realistic.                                                                                               Diagnoses:  Bi Polar Disorder  Plan of Care: Outpatient Therapy and psychotropic medication   Garrel Ridgel, PhD Time:4:15p-5:00p 45 min.

## 2023-03-19 ENCOUNTER — Ambulatory Visit: Payer: BC Managed Care – PPO | Admitting: Psychology

## 2023-03-19 DIAGNOSIS — F319 Bipolar disorder, unspecified: Secondary | ICD-10-CM | POA: Diagnosis not present

## 2023-03-19 NOTE — Progress Notes (Signed)
Name: Erik Smith Date: 03/19/2023 MRN: [id number] DOB: 01/29/1966 PCP: [name], MD     Erik Smith participated from home, via video, and consented to treatment. Therapist participated from home office.   Guardian/Payee:  N/A    Paperwork requested: No   Reason for Visit /Presenting Problem: Improve/manage feeling of agitation  Mental Status Exam: Appearance:   Fairly Groomed     Behavior:  Appropriate  Motor:  Normal  Speech/Language:   Normal Rate  Affect:  Appropriate  Mood:  normal  Thought process:  normal  Thought content:    WNL  Sensory/Perceptual disturbances:    WNL  Orientation:  oriented to person, place, and situation  Attention:  Good  Concentration:  Good  Memory:  WNL  Fund of knowledge:   Good  Insight:    Good  Judgment:   Good  Impulse Control:  Good     Reported Symptoms:  Agitation, frustration, worry  Risk Assessment: Danger to Self:  No Self-injurious Behavior: No Danger to Others: No Duty to Warn:no Physical Aggression / Violence:No  Access to Firearms a concern:  unknown Gang Involvement:No  Patient / guardian was educated about steps to take if suicide or homicide risk level increases between visits: n/a While future psychiatric events cannot be accurately predicted, the patient does not currently require acute inpatient psychiatric care and does not currently meet [facility] involuntary commitment criteria.  Substance Abuse History: Current substance abuse: No     Past Psychiatric History:   Previous psychological history is significant for Bipolar Mania Outpatient Providers:Dr. Evalina Field History of Psych  Hospitalization: Yes  Psychological Testing:  N/A    Abuse History:  Victim of: No.,  N/A    Report needed: No. Victim of Neglect:No. Perpetrator of  N/A   Witness / Exposure to Domestic Violence: No   Protective Services Involvement: No  Witness to [employer] Violence:  No   Family History:  Family History  Problem Relation Age of Onset   Coronary artery disease Father 51   Bladder Cancer Father    Sickle cell anemia Mother    Asthma Mother        smoker   Prostate cancer Paternal Grandfather 104       deceased from prostate CA   Lung cancer Paternal Grandmother 50       smoker   Breast cancer Paternal Uncle    Stroke Neg Hx    Diabetes Neg Hx    Colon cancer Neg Hx    Colon polyps Neg Hx    Esophageal cancer Neg Hx    Rectal cancer  Neg Hx    Stomach cancer Neg Hx     Living situation: the patient lives with their spouse  Sexual Orientation: Straight  Relationship Status: married  Name of spouse / other:Erik Smith If a parent, number of children / ages:2 boys, 63 & 15  Support Systems: family  Financial Stress:  No   Income/Employment/Disability: Employment  Financial planner:  unknown  Educational History: Education: post Engineer, maintenance (IT) work or degree  Religion/Sprituality/[place]: unknown  Any cultural differences that may affect / interfere with treatment:  not applicable   Recreation/Hobbies: woodworking  Stressors: Occupational concerns    Strengths: Family  Barriers:  unknown   Legal History: Pending legal issue / charges: The patient has no significant history of legal issues. History of legal issue / charges:  N/A  Medical History/Surgical History: not reviewed Past Medical History:  Diagnosis Date   Arthritis    Asthma    as a child   Bipolar disorder, unspecified (HCC)    hospitalization 2004 for depression, bipolar, ambien overdose   Cancer (HCC) 10/20/2015   skin ca on nose removed   Cholesteatoma 05/2010   s/p R ear surgery,  residual tinnitus, planning on L ear surgery   Depression    bipolar   Family history of ischemic heart disease    History of asthma    Migraine without aura, without mention of intractable migraine without mention of status migrainosus    Rosacea 2017   Spinal stenosis    Unspecified adjustment reaction     Past Surgical History:  Procedure Laterality Date   CARPOMETACARPEL SUSPENSION PLASTY Left 10/18/2017   Procedure: LEFT THUMB TRAPEZIECTOMY AND SUSPENSIONPLASTY;  Surgeon: [name], MD;  Location: [facility];  Service: Orthopedics;  Laterality: Left;   CARPOMETACARPEL SUSPENSION PLASTY Right 03/28/2018   Procedure: CARPOMETACARPEL [facility]) SUSPENSION PLASTY AND TRAPEZIECTOMY;  Surgeon: [name], MD;  Location: [facility];  Service: Orthopedics;  Laterality: Right;   COLONOSCOPY  1999   normal (in )   ESOPHAGUS SURGERY  2003   torn esophagus   INNER EAR SURGERY  1975, 1995, 2012, 2013   for cholesteatoma, bilateral first L then R   NECK SURGERY  2002   spinal stenosis and arthritis   perianal abscess  1998    Medications: Current Outpatient Medications  Medication Sig Dispense Refill   ARIPiprazole (ABILIFY) 2 MG tablet Take 2 mg by mouth at bedtime.     doxycycline (VIBRA-TABS) 100 MG tablet Take 100 mg by mouth 2 (two) times daily. As needed     fluticasone (FLONASE) 50 MCG/ACT nasal spray Place 2 sprays into both nostrils daily. 16 g 0   ibuprofen (ADVIL) 200 MG tablet Take 3 tablets (600 mg total) by mouth 2 (two) times daily as needed for moderate pain.     lamoTRIgine (LAMICTAL) 100 MG tablet Take 1 tablet (100 mg total) by mouth 2 (two) times daily.     levocetirizine (XYZAL) 5 MG tablet Take 1 tablet (5 mg total) by mouth every evening. 30 tablet 0   valACYclovir (VALTREX) 500 MG tablet TAKE 1 TABLET BY MOUTH TWICE DAILY 3 DAYS AT A TIME AS NEEDED 30 tablet 1   No current facility-administered medications for this visit.     Allergies  Allergen Reactions   Morphine And Codeine Itching            Goals/Plan: Patient is seeking counseling for mood stabilization. Will utilize cognitive strategies to help manage feelings of  anxiety and despair. Revised target date is 12-24 Monitor medication compliance -Ongoing. Session was virtual (video) at patient request and is aware of platform limitations. He is at home and provider in home office.   Patient agrees to a video Public affairs consultant) meeting and is aware of platform limitations. He is at home and provider is in home office.  Session Notes: Erik Smith talked about the cruise that he and his wife will be taking with his father.  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 Trying to be patient. They had a b-day party for his mother over the weekend. Celebrated other family member's birthdays at the same time. He is trying to stay positive and cautiously optimistic about the job and his future.                                                                                                     Diagnoses:  Bi Polar Disorder  Plan of Care: Outpatient Therapy and psychotropic medication   Garrel Ridgel, PhD Time:4:10p-5:00p 50 min.

## 2023-04-02 ENCOUNTER — Ambulatory Visit: Payer: BC Managed Care – PPO | Admitting: Psychology

## 2023-04-02 NOTE — Progress Notes (Unsigned)
Name: Erik Smith Date: 04/02/2023 MRN: [id number] DOB: [birth year] PCP: Erik Boyden, MD     Erik Smith participated from home, via video, and consented to treatment. Therapist participated from home office.   Guardian/Payee:  N/A    Paperwork requested: No   Reason for Visit /Presenting Problem: Improve/manage feeling of agitation  Mental Status Exam: Appearance:   Fairly Groomed     Behavior:  Appropriate  Motor:  Normal  Speech/Language:   Normal Rate  Affect:  Appropriate  Mood:  normal  Thought process:  normal  Thought content:    WNL  Sensory/Perceptual disturbances:    WNL  Orientation:  oriented to person, place, and situation  Attention:  Good  Concentration:  Good  Memory:  WNL  Fund of knowledge:   Good  Insight:    Good  Judgment:   Good  Impulse Control:  Good     Reported Symptoms:  Agitation, frustration, worry  Risk Assessment: Danger to Self:  No Self-injurious Behavior: No Danger to Others: No Duty to Warn:no Physical Aggression / Violence:No  Access to Firearms a concern:  unknown Gang Involvement:No  Patient / guardian was educated about steps to take if suicide or homicide risk level increases between visits: n/a While future psychiatric events cannot be accurately predicted, the patient does not currently require acute inpatient psychiatric care and does not currently meet [name] involuntary commitment criteria.  Substance Abuse History: Current substance abuse: No     Past Psychiatric History:   Previous psychological history is significant for Bipolar Mania Outpatient Providers:Dr. Evalina Smith History of Psych Hospitalization: Yes  Psychological Testing:  N/A    Abuse History:  Victim of: No.,  N/A    Report needed: No. Victim of Neglect:No. Perpetrator of  N/A   Witness / Exposure to Domestic Violence: No   Protective Services Involvement: No  Witness to [employer] Violence:  No   Family History:  Family History  Problem Relation Age of Onset   Coronary artery disease Father 40   Bladder Cancer Father    Sickle cell anemia Mother    Asthma Mother        smoker   Prostate cancer Paternal Grandfather 44       deceased from prostate CA   Lung cancer Paternal Grandmother 30       smoker   Breast cancer Paternal Uncle    Stroke Neg Hx    Diabetes Neg Hx    Colon cancer Neg Hx    Colon  polyps Neg Hx    Esophageal cancer Neg Hx    Rectal cancer Neg Hx    Stomach cancer Neg Hx     Living situation: the patient lives with their spouse  Sexual Orientation: Straight  Relationship Status: married  Name of spouse / other:Erik Smith If a parent, number of children / ages:2 boys, 37 & 15  Support Systems: family  Financial Stress:  No   Income/Employment/Disability: Employment  Financial planner:  unknown  Educational History: Education: post Engineer, maintenance (IT) work or degree  Religion/Sprituality/World View: unknown  Any cultural differences that may affect / interfere with treatment:  not applicable   Recreation/Hobbies: woodworking  Stressors: Occupational concerns    Strengths: Family  Barriers:  unknown   Legal History: Pending legal issue / charges: The patient has no significant history of legal issues. History of legal issue / charges:  N/A  Medical History/Surgical History: not reviewed Past Medical History:  Diagnosis Date   Arthritis    Asthma    as a child   Bipolar disorder, unspecified (HCC)    hospitalization 2004 for depression, bipolar, ambien overdose   Cancer (HCC) 10/20/2015   skin ca on nose removed   Cholesteatoma  05/2010   s/p R ear surgery, residual tinnitus, planning on L ear surgery   Depression    bipolar   Family history of ischemic heart disease    History of asthma    Migraine without aura, without mention of intractable migraine without mention of status migrainosus    Rosacea 2017   Spinal stenosis    Unspecified adjustment reaction     Past Surgical History:  Procedure Laterality Date   CARPOMETACARPEL SUSPENSION PLASTY Left 10/18/2017   Procedure: LEFT THUMB TRAPEZIECTOMY AND SUSPENSIONPLASTY;  Surgeon: Erik Loa, MD;  Location: [facility];  Service: Orthopedics;  Laterality: Left;   CARPOMETACARPEL SUSPENSION PLASTY Right 03/28/2018   Procedure: CARPOMETACARPEL [facility]) SUSPENSION PLASTY AND TRAPEZIECTOMY;  Surgeon: Erik Loa, MD;  Location: [facility];  Service: Orthopedics;  Laterality: Right;   COLONOSCOPY  1999   normal (in [place])   ESOPHAGUS SURGERY  2003   torn esophagus   INNER EAR SURGERY  1975, 1995, 2012, 2013   for cholesteatoma, bilateral first L then R   NECK SURGERY  2002   spinal stenosis and arthritis   perianal abscess  1998    Medications: Current Outpatient Medications  Medication Sig Dispense Refill   ARIPiprazole (ABILIFY) 2 MG tablet Take 2 mg by mouth at bedtime.     doxycycline (VIBRA-TABS) 100 MG tablet Take 100 mg by mouth 2 (two) times daily. As needed     fluticasone (FLONASE) 50 MCG/ACT nasal spray Place 2 sprays into both nostrils daily. 16 g 0   ibuprofen (ADVIL) 200 MG tablet Take 3 tablets (600 mg total) by mouth 2 (two) times daily as needed for moderate pain.     lamoTRIgine (LAMICTAL) 100 MG tablet Take 1 tablet (100 mg total) by mouth 2 (two) times daily.     levocetirizine (XYZAL) 5 MG tablet Take 1 tablet (5 mg total) by mouth every evening. 30 tablet 0   valACYclovir (VALTREX) 500 MG tablet TAKE 1 TABLET BY MOUTH TWICE DAILY 3 DAYS AT A TIME AS NEEDED 30 tablet 1   No current facility-administered  medications for this visit.    Allergies  Allergen Reactions   Morphine And Codeine Itching            Goals/Plan: Patient  is seeking counseling for mood stabilization. Will utilize cognitive strategies to help manage feelings of anxiety and despair. Revised target date is 12-24 Monitor medication compliance -Ongoing. Session was virtual (video) at patient request and is aware of platform limitations. He is at home and provider in home office.   Patient agrees to a video Public affairs consultant) meeting and is aware of platform limitations. He is at home and provider is in home office.  Session Notes: Katharine                                                                                                      Diagnoses:  Bi Polar Disorder  Plan of Care: Outpatient Therapy and psychotropic medication   Garrel Ridgel, PhD Time:4:15p-5:00p 45 min.

## 2023-04-16 ENCOUNTER — Ambulatory Visit: Payer: BC Managed Care – PPO | Admitting: Psychology

## 2023-04-16 DIAGNOSIS — F319 Bipolar disorder, unspecified: Secondary | ICD-10-CM | POA: Diagnosis not present

## 2023-04-16 NOTE — Progress Notes (Signed)
Name: Erik Smith Date: 04/16/2023 MRN: [id number] DOB: 1965/09/22 PCP: Erik Boyden, MD     Erik Smith participated from home, via video, and consented to treatment. Therapist participated from home office.   Guardian/Payee:  N/A    Paperwork requested: No   Reason for Visit /Presenting Problem: Improve/manage feeling of agitation  Mental Status Exam: Appearance:   Fairly Groomed     Behavior:  Appropriate  Motor:  Normal  Speech/Language:   Normal Rate  Affect:  Appropriate  Mood:  normal  Thought process:  normal  Thought content:    WNL  Sensory/Perceptual disturbances:    WNL  Orientation:  oriented to person, place, and situation  Attention:  Good  Concentration:  Good  Memory:  WNL  Fund of knowledge:   Good  Insight:    Good  Judgment:   Good  Impulse Control:  Good     Reported Symptoms:  Agitation, frustration, worry  Risk Assessment: Danger to Self:  No Self-injurious Behavior: No Danger to Others: No Duty to Warn:no Physical Aggression / Violence:No  Access to Firearms a concern:  unknown Gang Involvement:No  Patient / guardian was educated about steps to take if suicide or homicide risk level increases between visits: n/a While future psychiatric events cannot be accurately predicted, the patient does not currently require acute inpatient psychiatric care and does not currently meet [facility] involuntary commitment criteria.  Substance Abuse History: Current substance abuse: No     Past Psychiatric History:   Previous psychological history is significant for Bipolar  Mania Outpatient Providers:Dr. Evalina Field History of Psych Hospitalization: Yes  Psychological Testing:  N/A    Abuse History:  Victim of: No.,  N/A    Report needed: No. Victim of Neglect:No. Perpetrator of  N/A   Witness / Exposure to Domestic Violence: No   Protective Services Involvement: No  Witness to [employer] Violence:  No   Family History:  Family History  Problem Relation Age of Onset   Coronary artery disease Father 64   Bladder Cancer Father    Sickle cell anemia Mother    Asthma Mother        smoker   Prostate cancer Paternal Grandfather 31       deceased from prostate CA   Lung cancer Paternal Grandmother 55       smoker   Breast cancer Paternal Uncle    Stroke Neg Hx  Diabetes Neg Hx    Colon cancer Neg Hx    Colon polyps Neg Hx    Esophageal cancer Neg Hx    Rectal cancer Neg Hx    Stomach cancer Neg Hx     Living situation: the patient lives with their spouse  Sexual Orientation: Straight  Relationship Status: married  Name of spouse / other:Erik Smith If a parent, number of children / ages:2 boys, 89 & 15  Support Systems: family  Financial Stress:  No   Income/Employment/Disability: Employment  Financial planner:  unknown  Educational History: Education: post Engineer, maintenance (IT) work or degree  Religion/Sprituality/World View: unknown  Any cultural differences that may affect / interfere with treatment:  not applicable   Recreation/Hobbies: woodworking  Stressors: Occupational concerns    Strengths: Family  Barriers:  unknown   Legal History: Pending legal issue / charges: The patient has no significant history of legal issues. History of legal issue / charges:  N/A  Medical History/Surgical History: not reviewed Past Medical History:  Diagnosis Date   Arthritis    Asthma    as a child   Bipolar disorder, unspecified (HCC)    hospitalization 2004 for depression, bipolar, ambien overdose   Cancer (HCC) 10/20/2015   skin ca  on nose removed   Cholesteatoma 05/2010   s/p R ear surgery, residual tinnitus, planning on L ear surgery   Depression    bipolar   Family history of ischemic heart disease    History of asthma    Migraine without aura, without mention of intractable migraine without mention of status migrainosus    Rosacea 2017   Spinal stenosis    Unspecified adjustment reaction     Past Surgical History:  Procedure Laterality Date   CARPOMETACARPEL SUSPENSION PLASTY Left 10/18/2017   Procedure: LEFT THUMB TRAPEZIECTOMY AND SUSPENSIONPLASTY;  Surgeon: [name], MD;  Location: [facility];  Service: Orthopedics;  Laterality: Left;   CARPOMETACARPEL SUSPENSION PLASTY Right 03/28/2018   Procedure: CARPOMETACARPEL [facility]) SUSPENSION PLASTY AND TRAPEZIECTOMY;  Surgeon: [name], MD;  Location:  [facility];  Service: Orthopedics;  Laterality: Right;   COLONOSCOPY  1999   normal (in [place])   ESOPHAGUS SURGERY  2003   torn esophagus   INNER EAR SURGERY  1975, 1995, 2012, 2013   for cholesteatoma, bilateral first L then R   NECK SURGERY  2002   spinal stenosis and arthritis   perianal abscess  1998    Medications: Current Outpatient Medications  Medication Sig Dispense Refill   ARIPiprazole (ABILIFY) 2 MG tablet Take 2 mg by mouth at bedtime.     doxycycline (VIBRA-TABS) 100 MG tablet Take 100 mg by mouth 2 (two) times daily. As needed     fluticasone (FLONASE) 50 MCG/ACT nasal spray Place 2 sprays into both nostrils daily. 16 g 0   ibuprofen (ADVIL) 200 MG tablet Take 3 tablets (600 mg total) by mouth 2 (two) times daily as needed for moderate pain.     lamoTRIgine (LAMICTAL) 100 MG tablet Take 1 tablet (100 mg total) by mouth 2 (two) times daily.     levocetirizine (XYZAL) 5 MG tablet Take 1 tablet (5 mg total) by mouth every evening. 30 tablet 0   valACYclovir (VALTREX) 500 MG tablet TAKE 1 TABLET BY MOUTH TWICE DAILY 3 DAYS AT A TIME AS NEEDED 30 tablet 1    No current facility-administered medications for this visit.    Allergies  Allergen Reactions   Morphine And Codeine  Itching            Goals/Plan: Patient is seeking counseling for mood stabilization. Will utilize cognitive strategies to help manage feelings of anxiety and despair. Revised target date is 12-25 Monitor medication compliance -Ongoing. Session was virtual (video) at patient request and is aware of platform limitations. He is at home and provider in home office.   Patient agrees to a video Public affairs consultant) meeting and is aware of platform limitations. He is at home and provider is in home office.  Session Notes: Andria says that the Thanksgiving cruise with his father was good. There were some frustrations with father, but he and his wife did their own thing and had a good time. Priscilla is agitated that he got a letter from [employer] attorney about paying one of his mother's bills. He says they (mother or brother) could have just mailed the bill to him and avoided the lawyer. Sosa did not get the job he hope he would get. The job was offered to a person that spoke spanish, but they were very kind and supportive. He is not discouraged and plans to keep his job search going strong.                                                                                                        Diagnoses:  Bipolar Disorder  Plan of Care: Outpatient Therapy and psychotropic medication   Garrel Ridgel, PhD Time:4:15p-5:00p 45 min.

## 2023-04-30 ENCOUNTER — Ambulatory Visit: Payer: BC Managed Care – PPO | Admitting: Psychology

## 2023-05-14 ENCOUNTER — Ambulatory Visit (INDEPENDENT_AMBULATORY_CARE_PROVIDER_SITE_OTHER): Payer: 59 | Admitting: Psychology

## 2023-05-14 DIAGNOSIS — F319 Bipolar disorder, unspecified: Secondary | ICD-10-CM | POA: Diagnosis not present
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
## 2023-05-28 ENCOUNTER — Ambulatory Visit: Payer: 59 | Admitting: Psychology

## 2023-05-28 DIAGNOSIS — F319 Bipolar disorder, unspecified: Secondary | ICD-10-CM | POA: Diagnosis not present

## 2023-05-28 NOTE — Progress Notes (Signed)
Name: Erik Smith Date: 05/28/2023 MRN: [id number] DOB: 12-30-1965 PCP: Erik Boyden, MD     Erik Smith participated from home, via video, and consented to treatment. Therapist participated from home office.   Guardian/Payee:  N/A    Paperwork requested: No   Reason for Visit /Presenting Problem: Improve/manage feeling of agitation  Mental Status Exam: Appearance:   Fairly Groomed     Behavior:  Appropriate  Motor:  Normal  Speech/Language:   Normal Rate  Affect:  Appropriate  Mood:  normal  Thought process:  normal  Thought content:    WNL  Sensory/Perceptual disturbances:    WNL  Orientation:  oriented to person, place, and situation  Attention:  Good  Concentration:  Good  Memory:  WNL  Fund of knowledge:   Good  Insight:    Good  Judgment:   Good  Impulse Control:  Good     Reported Symptoms:  Agitation, frustration, worry  Risk Assessment: Danger to Self:  No Self-injurious Behavior: No Danger to Others: No Duty to Warn:no Physical Aggression / Violence:No  Access to Firearms a concern:  unknown Gang Involvement:No  Patient / guardian was educated about steps to take if suicide or homicide risk level increases between visits: n/a While future psychiatric events cannot be accurately predicted, the patient does not currently require acute inpatient psychiatric care and does not currently meet [facility] involuntary commitment criteria.  Substance Abuse History: Current substance abuse: No     Past Psychiatric History:   Previous psychological history is significant for Bipolar Mania Outpatient Providers:Dr. Evalina Smith History of Psych Hospitalization: Yes  Psychological Testing:  N/A    Abuse History:  Victim of: No.,  N/A    Report needed: No. Victim of Neglect:No. Perpetrator of  N/A   Witness / Exposure to Domestic Violence: No   Protective Services Involvement: No  Witness to [employer] Violence:  No   Family History:  Family  History  Problem Relation Age of Onset   Coronary artery disease Father 74   Bladder Cancer Father    Sickle cell anemia Mother    Asthma Mother        smoker   Prostate cancer Paternal Grandfather 36       deceased from prostate CA   Lung cancer Paternal Grandmother 52       smoker   Breast cancer Paternal Uncle    Stroke Neg Hx    Diabetes Neg Hx    Colon cancer Neg Hx    Colon polyps Neg Hx    Esophageal cancer Neg Hx    Rectal cancer Neg Hx    Stomach cancer Neg Hx     Living situation: the patient lives with their spouse  Sexual Orientation: Straight  Relationship Status: married  Name of spouse / other:Erik Smith If a parent, number of children / ages:2 boys, 60 & 15  Support Systems: family  Financial Stress:  No   Income/Employment/Disability: Employment  Financial planner:  unknown  Educational History: Education: post Engineer, maintenance (IT) work or degree  Religion/Sprituality/World View: unknown  Any cultural differences that may affect / interfere with treatment:  not applicable   Recreation/Hobbies: woodworking  Stressors: Occupational concerns    Strengths: Family  Barriers:  unknown   Legal History: Pending legal issue / charges: The patient has no significant history of legal issues. History of legal issue / charges:  N/A  Medical History/Surgical History: not reviewed Past Medical History:  Diagnosis Date   Arthritis  Asthma    as a child   Bipolar disorder, unspecified (HCC)    hospitalization 2004 for depression, bipolar, ambien overdose   Cancer (HCC) 10/20/2015   skin ca on nose removed   Cholesteatoma 05/2010   s/p R ear surgery, residual tinnitus, planning on L ear surgery   Depression    bipolar   Family history of ischemic heart disease    History of asthma    Migraine without aura, without mention of intractable migraine without mention of status migrainosus    Rosacea 2017   Spinal stenosis    Unspecified adjustment reaction      Past Surgical History:  Procedure Laterality Date   CARPOMETACARPEL SUSPENSION PLASTY Left 10/18/2017   Procedure: LEFT THUMB TRAPEZIECTOMY AND SUSPENSIONPLASTY;  Surgeon: Erik Loa, MD;  Location: [facility];  Service: Orthopedics;  Laterality: Left;   CARPOMETACARPEL SUSPENSION PLASTY Right 03/28/2018   Procedure: CARPOMETACARPEL [facility]) SUSPENSION PLASTY AND TRAPEZIECTOMY;  Surgeon: Erik Loa, MD;  Location: [facility];  Service: Orthopedics;  Laterality: Right;   COLONOSCOPY  1999   normal (in [place])   ESOPHAGUS SURGERY  2003   torn esophagus   INNER EAR SURGERY  1975, 1995, 2012, 2013   for cholesteatoma, bilateral first L then R   NECK SURGERY  2002   spinal stenosis and arthritis   perianal abscess  1998    Medications: Current Outpatient Medications  Medication Sig Dispense Refill   ARIPiprazole (ABILIFY) 2 MG tablet Take 2 mg by mouth at bedtime.     doxycycline (VIBRA-TABS) 100 MG tablet Take 100 mg by mouth 2 (two) times daily. As needed     fluticasone (FLONASE) 50 MCG/ACT nasal spray Place 2 sprays into both nostrils daily. 16 g 0   ibuprofen (ADVIL) 200 MG tablet Take 3 tablets (600 mg total) by mouth 2 (two) times daily as needed for moderate pain.     lamoTRIgine (LAMICTAL) 100 MG tablet Take 1 tablet (100 mg total) by mouth 2 (two) times daily.     levocetirizine (XYZAL) 5 MG tablet Take 1 tablet (5 mg total) by mouth every evening. 30 tablet 0   valACYclovir (VALTREX) 500 MG tablet TAKE 1 TABLET BY MOUTH TWICE DAILY 3 DAYS AT A TIME AS NEEDED 30 tablet 1   No current facility-administered medications for this visit.    Allergies  Allergen Reactions   Morphine And Codeine Itching            Goals/Plan: Patient is seeking counseling for mood stabilization. Will utilize cognitive strategies to help manage feelings of anxiety and despair. Revised target date is 12-25 Monitor medication compliance  -Ongoing.   Patient agrees to a video Public affairs consultant) meeting and is aware of platform limitations. He is at home and provider is in home office.  Session Notes: Erik Smith says it is terrible that his brother is now power of attorney, along with him, for their mother. His brother sees all of Daivion's actions as criminal. There is an Museum/gallery curator and Beth is concerned that it will turn very negative. He was hoping they could work together, but it is clear that will not happen. Discussed his plan to manage the meeting and accomplish goals. He also addressed his [place].  Diagnoses:  Bipolar Disorder  Plan of Care: Outpatient Therapy and psychotropic medication   Garrel Ridgel, PhD Time:4:15p-5:00p 45 min.

## 2023-06-11 ENCOUNTER — Ambulatory Visit (INDEPENDENT_AMBULATORY_CARE_PROVIDER_SITE_OTHER): Payer: 59 | Admitting: Psychology

## 2023-06-11 DIAGNOSIS — F319 Bipolar disorder, unspecified: Secondary | ICD-10-CM

## 2023-06-11 NOTE — Progress Notes (Signed)
Name: Erik Smith Date: 06/11/2023 MRN: [id number] DOB: [birth year] PCP: [name], MD     [name] participated from home, via video, and consented to treatment. Therapist participated from home office.   Guardian/Payee:  N/A    Paperwork requested: No   Reason for Visit /Presenting Problem: Improve/manage feeling of agitation  Mental Status Exam: Appearance:   Fairly Groomed     Behavior:  Appropriate  Motor:  Normal  Speech/Language:   Normal Rate  Affect:  Appropriate  Mood:  normal  Thought process:  normal  Thought content:    WNL  Sensory/Perceptual disturbances:    WNL  Orientation:  oriented to person, place, and situation  Attention:  Good  Concentration:  Good  Memory:  WNL  Fund of knowledge:   Good  Insight:    Good  Judgment:   Good  Impulse Control:  Good     Reported Symptoms:  Agitation, frustration, worry  Risk Assessment: Danger to Self:  No Self-injurious Behavior: No Danger to Others: No Duty to Warn:no Physical Aggression / Violence:No  Access to Firearms a concern:  unknown Gang Involvement:No  Patient / guardian was educated about steps to take if suicide or homicide risk level increases between visits: n/a While future psychiatric events cannot be accurately predicted, the patient does not currently require acute inpatient psychiatric care and does not currently meet [facility] involuntary commitment criteria.  Substance Abuse History: Current substance abuse: No     Past Psychiatric History:   Previous psychological history is significant for Bipolar Mania Outpatient Providers:Dr. Evalina Field History of Psych Hospitalization: Yes  Psychological Testing:  N/A    Abuse History:  Victim of: No.,  N/A    Report needed: No. Victim of Neglect:No. Perpetrator of  N/A   Witness / Exposure to Domestic Violence: No   Protective Services Involvement: No  Witness to [employer] Violence:  No    Family History:  Family History  Problem Relation Age of Onset   Coronary artery disease Father 8   Bladder Cancer Father    Sickle cell anemia Mother    Asthma Mother        smoker   Prostate cancer Paternal Grandfather 58       deceased from prostate CA   Lung cancer Paternal Grandmother 43       smoker   Breast cancer Paternal Uncle    Stroke Neg Hx    Diabetes Neg Hx    Colon cancer Neg Hx    Colon polyps Neg Hx    Esophageal cancer Neg Hx    Rectal cancer Neg Hx    Stomach cancer Neg Hx     Living situation: the patient lives with their spouse  Sexual Orientation: Straight  Relationship Status: married  Name of spouse / other:[name] If a parent, number of children / ages:2 boys, 62 & 15  Support Systems: family  Financial Stress:  No   Income/Employment/Disability: Employment  Financial planner:  unknown  Educational History: Education: post Engineer, maintenance (IT) work or degree  Religion/Sprituality/World View: unknown  Any cultural differences that may affect / interfere with treatment:  not applicable   Recreation/Hobbies: woodworking  Stressors: Occupational concerns    Strengths: Family  Barriers:  unknown   Legal History: Pending legal issue / charges: The patient has no significant history of legal issues. History of legal issue / charges:  N/A  Medical History/Surgical History: not reviewed Past Medical History:  Diagnosis Date   Arthritis    Asthma    as a child   Bipolar disorder, unspecified (HCC)    hospitalization 2004 for depression, bipolar, ambien overdose   Cancer (HCC) 10/20/2015   skin ca on nose removed   Cholesteatoma 05/2010   s/p R ear surgery, residual tinnitus, planning on L ear surgery   Depression    bipolar   Family history of ischemic heart disease    History of asthma    Migraine without aura, without mention of intractable migraine without mention of status migrainosus    Rosacea 2017   Spinal stenosis     Unspecified adjustment reaction     Past Surgical History:  Procedure Laterality Date   CARPOMETACARPEL SUSPENSION PLASTY Left 10/18/2017   Procedure: LEFT THUMB TRAPEZIECTOMY AND SUSPENSIONPLASTY;  Surgeon: [name], MD;  Location: [facility];  Service: Orthopedics;  Laterality: Left;   CARPOMETACARPEL SUSPENSION PLASTY Right 03/28/2018   Procedure: CARPOMETACARPEL [facility]) SUSPENSION PLASTY AND TRAPEZIECTOMY;  Surgeon: [name], MD;  Location: [facility];  Service: Orthopedics;  Laterality: Right;   COLONOSCOPY  1999   normal (in [place])   ESOPHAGUS SURGERY  2003   torn esophagus   INNER EAR SURGERY  1975, 1995, 2012, 2013   for cholesteatoma, bilateral first L then R   NECK SURGERY  2002   spinal stenosis and arthritis   perianal abscess  1998    Medications: Current Outpatient Medications  Medication Sig Dispense Refill   ARIPiprazole (ABILIFY) 2 MG tablet Take 2 mg by mouth at bedtime.     doxycycline (VIBRA-TABS) 100 MG tablet Take 100 mg by mouth 2 (two) times daily. As needed     fluticasone (FLONASE) 50 MCG/ACT nasal spray Place 2 sprays into both nostrils daily. 16 g 0   ibuprofen (ADVIL) 200 MG tablet Take 3 tablets (600 mg total) by mouth 2 (two) times daily as needed for moderate pain.     lamoTRIgine (LAMICTAL) 100 MG tablet Take 1 tablet (100 mg total) by mouth 2 (two) times daily.     levocetirizine (XYZAL) 5 MG tablet Take 1 tablet (5 mg total) by mouth every evening. 30 tablet 0   valACYclovir (VALTREX) 500 MG tablet TAKE 1 TABLET BY MOUTH TWICE DAILY 3 DAYS AT A TIME AS NEEDED 30 tablet 1   No current facility-administered medications for this visit.    Allergies  Allergen Reactions   Morphine And Codeine Itching            Goals/Plan: Patient is seeking counseling for mood stabilization. Will utilize cognitive strategies to help manage feelings of anxiety and despair. Revised target date is 12-25 Monitor medication  compliance -Ongoing.   Patient agrees to a video Public affairs consultant) meeting and is aware of platform limitations. He is at home and provider is in home office.  Session Notes: Erik Smith says he just had his 58th Bday. Next day was driving [employer] and a passenger was one of his "heroes" , one of the [place] 4". They got approved to move [place] the beach. He is selling off lots of the items around the trailer. Will move it to the beach on April 1st. He has a meeting with the lawyers and his brothers on Wednesday. Neither brother has accepted invitation at this point. He is very frustrated and clearly the scapegoat in the family. He is working to be honest and helpful. Trying  to let this not define him and to let it roll off.                                                                                                              Diagnoses:  Bipolar Disorder  Plan of Care: Outpatient Therapy and psychotropic medication   Erik Ridgel, PhD Time:4:15p-5:00p 45 min.

## 2023-06-25 ENCOUNTER — Ambulatory Visit: Payer: 59 | Admitting: Psychology

## 2023-06-25 DIAGNOSIS — F319 Bipolar disorder, unspecified: Secondary | ICD-10-CM

## 2023-06-25 NOTE — Progress Notes (Signed)
Name: Erik Smith Date: 06/25/2023 MRN: [id number] DOB: 02-08-1966 PCP: Erik Boyden, MD     Erik Smith participated from home, via video, and consented to treatment. Therapist participated from home office.   Guardian/Payee:  N/A    Paperwork requested: No   Reason for Visit /Presenting Problem: Improve/manage feeling of agitation  Mental Status Exam: Appearance:   Fairly Groomed     Behavior:  Appropriate  Motor:  Normal  Speech/Language:   Normal Rate  Affect:  Appropriate  Mood:  normal  Thought process:  normal  Thought content:    WNL  Sensory/Perceptual disturbances:    WNL  Orientation:  oriented to person, place, and situation  Attention:  Good  Concentration:  Good  Memory:  WNL  Fund of knowledge:   Good  Insight:    Good  Judgment:   Good  Impulse Control:  Good     Reported Symptoms:  Agitation, frustration, worry  Risk Assessment: Danger to Self:  No Self-injurious Behavior: No Danger to Others: No Duty to Warn:no Physical Aggression / Violence:No  Access to Firearms a concern:  unknown Gang Involvement:No  Patient / guardian was educated about steps to take if suicide or homicide risk level increases between visits: n/a While future psychiatric events cannot be accurately predicted, the patient does not currently require acute inpatient psychiatric care and does not currently meet [facility] involuntary commitment criteria.  Substance Abuse History: Current substance abuse: No     Past Psychiatric History:   Previous psychological history is significant for Bipolar Mania Outpatient Providers:Dr. Evalina Field History of Psych Hospitalization: Yes  Psychological Testing:  N/A    Abuse History:  Victim of: No.,  N/A    Report needed: No. Victim of Neglect:No. Perpetrator of  N/A   Witness / Exposure to Domestic Violence: No   Protective Services Involvement: No   Witness to [employer] Violence:  No   Family History:  Family History  Problem Relation Age of Onset   Coronary artery disease Father 41   Bladder Cancer Father    Sickle cell anemia Mother    Asthma Mother        smoker   Prostate cancer Paternal Grandfather 32       deceased from prostate CA   Lung cancer Paternal Grandmother 69       smoker   Breast cancer Paternal Uncle    Stroke Neg Hx    Diabetes Neg Hx    Colon cancer Neg Hx    Colon polyps Neg Hx    Esophageal cancer Neg Hx    Rectal cancer Neg Hx    Stomach cancer Neg Hx     Living situation: the patient lives with their spouse  Sexual Orientation: Straight  Relationship Status: married  Name of spouse / other:Erik Smith If a parent, number of children / ages:2 boys, 26 & 15  Support Systems: family  Financial Stress:  No   Income/Employment/Disability: Employment  Financial planner:  unknown  Educational History: Education: post Engineer, maintenance (IT) work or degree  Religion/Sprituality/World View: unknown  Any cultural differences that may affect / interfere with treatment:  not applicable   Recreation/Hobbies: woodworking  Stressors: Occupational concerns    Strengths: Family  Barriers:  unknown   Legal History: Pending legal issue / charges: The patient  has no significant history of legal issues. History of legal issue / charges:  N/A  Medical History/Surgical History: not reviewed Past Medical History:  Diagnosis Date   Arthritis    Asthma    as a child   Bipolar disorder, unspecified (HCC)    hospitalization 2004 for depression, bipolar, ambien overdose   Cancer (HCC) 10/20/2015   skin ca on nose removed   Cholesteatoma 05/2010   s/p R ear surgery, residual tinnitus, planning on L ear surgery   Depression    bipolar   Family history of ischemic heart disease    History of asthma    Migraine without aura, without mention of intractable migraine without mention of status migrainosus     Rosacea 2017   Spinal stenosis    Unspecified adjustment reaction     Past Surgical History:  Procedure Laterality Date   CARPOMETACARPEL SUSPENSION PLASTY Left 10/18/2017   Procedure: LEFT THUMB TRAPEZIECTOMY AND SUSPENSIONPLASTY;  Surgeon: [name], MD;  Location: [facility];  Service: Orthopedics;  Laterality: Left;   CARPOMETACARPEL SUSPENSION PLASTY Right 03/28/2018   Procedure: CARPOMETACARPEL [facility]) SUSPENSION PLASTY AND TRAPEZIECTOMY;  Surgeon: [name], MD;  Location: [facility];  Service: Orthopedics;  Laterality: Right;   COLONOSCOPY  1999   normal (in [place])   ESOPHAGUS SURGERY  2003   torn esophagus   INNER EAR SURGERY  1975, 1995, 2012, 2013   for cholesteatoma, bilateral first L then R   NECK SURGERY  2002   spinal stenosis and arthritis   perianal abscess  1998    Medications: Current Outpatient Medications  Medication Sig Dispense Refill   ARIPiprazole (ABILIFY) 2 MG tablet Take 2 mg by mouth at bedtime.     doxycycline (VIBRA-TABS) 100 MG tablet Take 100 mg by mouth 2 (two) times daily. As needed     fluticasone (FLONASE) 50 MCG/ACT nasal spray Place 2 sprays into both nostrils daily. 16 g 0   ibuprofen (ADVIL) 200 MG tablet Take 3 tablets (600 mg total) by mouth 2 (two) times daily as needed for moderate pain.     lamoTRIgine (LAMICTAL) 100 MG tablet Take 1 tablet (100 mg total) by mouth 2 (two) times daily.     levocetirizine (XYZAL) 5 MG tablet Take 1 tablet (5 mg total) by mouth every evening. 30 tablet 0   valACYclovir (VALTREX) 500 MG tablet TAKE 1 TABLET BY MOUTH TWICE DAILY 3 DAYS AT A TIME AS NEEDED 30 tablet 1   No current facility-administered medications for this visit.    Allergies  Allergen Reactions   Morphine And Codeine Itching            Goals/Plan: Patient is seeking counseling for mood stabilization. Will utilize cognitive strategies to help manage feelings of anxiety and despair. Revised  target date is 12-25 Monitor medication compliance -Ongoing.   Patient agrees to a video Public affairs consultant) meeting and is aware of platform limitations. He is at home and provider is in home office.  Session Notes: Zyren says that his sibs did not respond to the attorney and the meeting never happened. He is very frustrated with the family and the lack of cooperation and communication with his brothers. The school has determined the "AG Allotments" for the year, which effects [place]. 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 He is often overwhelmed with all he has going on, but claims that he is emotionally stable with  even moods. We talked about prioritizing issues that need his attention in order to remain a sense of control. He is improving at being able to "put things in perspective".                                                                                                                Diagnoses:  Bipolar Disorder  Plan of Care: Outpatient Therapy and psychotropic medication   Garrel Ridgel, PhD Time:4:10p-5:00p 50 min.

## 2023-07-02 ENCOUNTER — Encounter: Payer: Self-pay | Admitting: Family Medicine

## 2023-07-02 DIAGNOSIS — F319 Bipolar disorder, unspecified: Secondary | ICD-10-CM

## 2023-07-02 NOTE — Telephone Encounter (Signed)
 Last OV:  10/30/22, CPE Next OV:  11/02/23, CPE

## 2023-07-03 MED ORDER — ARIPIPRAZOLE 2 MG PO TABS: 2.0000 mg | ORAL_TABLET | Freq: Every day | ORAL | 1 refills | Status: DC

## 2023-07-03 NOTE — Addendum Note (Signed)
 Addended by: Nanci Pina on: 07/03/2023 09:39 AM   Modules accepted: Orders

## 2023-07-03 NOTE — Addendum Note (Signed)
 Addended by: [name] on: 07/03/2023 08:00 AM   Modules accepted: Orders

## 2023-07-03 NOTE — Addendum Note (Signed)
 Addended by: [name] on: 07/03/2023 05:12 PM   Modules accepted: Orders

## 2023-07-04 MED ORDER — LAMOTRIGINE 100 MG PO TABS: 100.0000 mg | ORAL_TABLET | Freq: Two times a day (BID) | ORAL | 1 refills | Status: DC

## 2023-07-04 NOTE — Addendum Note (Signed)
 Addended by: [name] on: 07/04/2023 08:13 AM   Modules accepted: Orders

## 2023-07-07 ENCOUNTER — Encounter: Payer: Self-pay | Admitting: Family Medicine

## 2023-07-07 DIAGNOSIS — A6000 Herpesviral infection of urogenital system, unspecified: Secondary | ICD-10-CM

## 2023-07-09 ENCOUNTER — Ambulatory Visit: Payer: BC Managed Care – PPO | Admitting: Psychology

## 2023-07-09 DIAGNOSIS — F319 Bipolar disorder, unspecified: Secondary | ICD-10-CM | POA: Diagnosis not present

## 2023-07-09 NOTE — Progress Notes (Signed)
 Name: Erik Smith Date: 07/09/2023 MRN: [id number] DOB: [birth year] PCP: Erik Boyden, MD     Erik Smith participated from home, via video, and consented to treatment. Therapist participated from home office.   Guardian/Payee:  N/A    Paperwork requested: No   Reason for Visit /Presenting Problem: Improve/manage feeling of agitation  Mental Status Exam: Appearance:   Fairly Groomed     Behavior:  Appropriate  Motor:  Normal  Speech/Language:   Normal Rate  Affect:  Appropriate  Mood:  normal  Thought process:  normal  Thought content:    WNL  Sensory/Perceptual disturbances:    WNL  Orientation:  oriented to person, place, and situation  Attention:  Good  Concentration:  Good  Memory:  WNL  Fund of knowledge:   Good  Insight:    Good  Judgment:   Good  Impulse Control:  Good     Reported Symptoms:  Agitation, frustration, worry  Risk Assessment: Danger to Self:  No Self-injurious Behavior: No Danger to Others: No Duty to Warn:no Physical Aggression / Violence:No  Access to Firearms a concern:  unknown Gang Involvement:No  Patient / guardian was educated about steps to take if suicide or homicide risk level increases between visits: n/a While future psychiatric events cannot be accurately predicted, the patient does not currently require acute inpatient psychiatric care and does not currently meet [facility] involuntary commitment criteria.  Substance Abuse History: Current substance abuse: No     Past Psychiatric History:   Previous psychological history is significant for Bipolar Mania Outpatient Providers:Erik Smith History of Psych Hospitalization: Yes  Psychological Testing:  N/A    Abuse History:  Victim of: No.,  N/A    Report needed: No. Victim of Neglect:No. Perpetrator of  N/A   Witness / Exposure to Domestic Violence: No   Protective Services Involvement: No  Witness to [employer] Violence:  No   Family History:  Family  History  Problem Relation Age of Onset   Coronary artery disease Father 52   Bladder Cancer Father    Sickle cell anemia Mother    Asthma Mother        smoker   Prostate cancer Paternal Grandfather 31       deceased from prostate CA   Lung cancer Paternal Grandmother 90       smoker   Breast cancer Paternal Uncle    Stroke Neg Hx    Diabetes Neg Hx    Colon cancer Neg Hx    Colon polyps Neg Hx    Esophageal cancer Neg Hx    Rectal cancer Neg Hx    Stomach cancer Neg Hx     Living situation: the patient lives with their spouse  Sexual Orientation: Straight  Relationship Status: married  Name of spouse / other:Erik Smith If a parent, number of children / ages:2 boys, 25 & 15  Support Systems: family  Financial Stress:  No   Income/Employment/Disability: Employment  Financial planner:  unknown  Educational History: Education: post Engineer, maintenance (IT) work or degree  Religion/Sprituality/World View: unknown  Any cultural differences that may affect / interfere with treatment:  not applicable   Recreation/Hobbies: woodworking  Stressors: Occupational concerns    Strengths: Family  Barriers:  unknown   Legal History: Pending legal issue / charges: The patient has no significant history of legal issues. History of legal issue / charges:  N/A  Medical History/Surgical History: not reviewed Past Medical History:  Diagnosis Date   Arthritis  Asthma    as a child   Bipolar disorder, unspecified (HCC)    hospitalization 2004 for depression, bipolar, ambien overdose   Cancer (HCC) 10/20/2015   skin ca on nose removed   Cholesteatoma 05/2010   s/p R ear surgery, residual tinnitus, planning on L ear surgery   Depression    bipolar   Family history of ischemic heart disease    History of asthma    Migraine without aura, without mention of intractable migraine without mention of status migrainosus    Rosacea 2017   Spinal stenosis    Unspecified adjustment reaction      Past Surgical History:  Procedure Laterality Date   CARPOMETACARPEL SUSPENSION PLASTY Left 10/18/2017   Procedure: LEFT THUMB TRAPEZIECTOMY AND SUSPENSIONPLASTY;  Surgeon: Erik Loa, MD;  Location: [facility];  Service: Orthopedics;  Laterality: Left;   CARPOMETACARPEL SUSPENSION PLASTY Right 03/28/2018   Procedure: CARPOMETACARPEL [facility]) SUSPENSION PLASTY AND TRAPEZIECTOMY;  Surgeon: Erik Loa, MD;  Location: [facility];  Service: Orthopedics;  Laterality: Right;   COLONOSCOPY  1999   normal (in [place])   ESOPHAGUS SURGERY  2003   torn esophagus   INNER EAR SURGERY  1975, 1995, 2012, 2013   for cholesteatoma, bilateral first L then R   NECK SURGERY  2002   spinal stenosis and arthritis   perianal abscess  1998    Medications: Current Outpatient Medications  Medication Sig Dispense Refill   ARIPiprazole (ABILIFY) 2 MG tablet Take 1 tablet (2 mg total) by mouth at bedtime. 90 tablet 1   doxycycline (VIBRA-TABS) 100 MG tablet Take 100 mg by mouth 2 (two) times daily. As needed     fluticasone (FLONASE) 50 MCG/ACT nasal spray Place 2 sprays into both nostrils daily. 16 g 0   ibuprofen (ADVIL) 200 MG tablet Take 3 tablets (600 mg total) by mouth 2 (two) times daily as needed for moderate pain.     lamoTRIgine (LAMICTAL) 100 MG tablet Take 1 tablet (100 mg total) by mouth 2 (two) times daily. 180 tablet 1   levocetirizine (XYZAL) 5 MG tablet Take 1 tablet (5 mg total) by mouth every evening. 30 tablet 0   valACYclovir (VALTREX) 500 MG tablet TAKE 1 TABLET BY MOUTH TWICE DAILY 3 DAYS AT A TIME AS NEEDED 30 tablet 1   No current facility-administered medications for this visit.    Allergies  Allergen Reactions   Morphine And Codeine Itching            Goals/Plan: Patient is seeking counseling for mood stabilization. Will utilize cognitive strategies to help manage feelings of anxiety and despair. Revised target date is 12-25 Monitor  medication compliance -Ongoing.   Patient agrees to a video Public affairs consultant) meeting and is aware of platform limitations. He is at home and provider is in home office.  Session Notes: Erik Smith says he is exhausted. Last week he had a panic episode "out of nowhere"., and he thinks it was a culmination of a lot of frustrating things building in his life. He is in touch with his anger. Says his wife told him he is angry and "short-tempered" all the time. In reflection, he thinks he was unaware of how angry he has been. Will see new psychiatrist next Monday to help balance his meds. He thinks the problem is related to the imbalance of meds. He says that the way things are being done in politics is contributing to his despair and anger. In the past, doing  some exercise helps him relax. He thinks that he thought he was "handling it" better than he actually was handling it.                                                                                                                    Diagnoses:  Bipolar Disorder  Plan of Care: Outpatient Therapy and psychotropic medication   Garrel Ridgel, PhD Time:4:10p-5:00p 50 min.

## 2023-07-09 NOTE — Telephone Encounter (Signed)
 Valtrex Last rx:  04/20/22, #30 Last OV:  10/30/22, CPE Next OV:  11/02/23, CPE

## 2023-07-10 MED ORDER — VALACYCLOVIR HCL 500 MG PO TABS
ORAL_TABLET | ORAL | 3 refills | Status: AC
Start: 2023-07-10 — End: ?

## 2023-07-10 NOTE — Telephone Encounter (Signed)
 ERx

## 2023-07-16 ENCOUNTER — Encounter (HOSPITAL_COMMUNITY): Payer: Self-pay | Admitting: Psychiatry

## 2023-07-16 ENCOUNTER — Telehealth (HOSPITAL_BASED_OUTPATIENT_CLINIC_OR_DEPARTMENT_OTHER): Payer: 59 | Admitting: Psychiatry

## 2023-07-16 VITALS — Wt 232.0 lb

## 2023-07-16 DIAGNOSIS — F410 Panic disorder [episodic paroxysmal anxiety] without agoraphobia: Secondary | ICD-10-CM | POA: Diagnosis not present

## 2023-07-16 DIAGNOSIS — F319 Bipolar disorder, unspecified: Secondary | ICD-10-CM | POA: Diagnosis not present

## 2023-07-16 NOTE — Progress Notes (Addendum)
 Psychiatric Initial Adult Assessment   Virtual Visit via Video Note  I connected with Erik Smith on 07/16/23 at  1:00 PM EDT by a video enabled telemedicine application and verified that I am speaking with the correct person using two identifiers.  Location: Patient: Work Provider: Economist   I discussed the limitations of evaluation and management by telemedicine and the availability of in person appointments. The patient expressed understanding and agreed to proceed.   Patient Identification: Erik Smith MRN:  [id number] Date of Evaluation:  07/16/2023 Referral Source: Primary care office Chief Complaint:   Chief Complaint  Patient presents with   Establish Care   Visit Diagnosis:    ICD-10-CM   1. Bipolar 1 disorder (HCC)  F31.9     2. Panic attacks  F41.0       History of Present Illness: Patient is 58 year old Caucasian, married, employed man who is referred from his primary care physician Dr. Claire Crick for the management of his psychiatric symptoms.  We do not have initial paperwork from which he supposed to email to us .  Patient has a history of bipolar disorder and panic attack.  He is taking Abilify  and Lamictal .  Patient told he is taking these medication prescribed by his psychiatrist Dr. Elva Hamburger who now retired.  He is looking for a new psychiatrist but in the meantime his primary care refill the medication.  Patient told he was taking Lamictal  25 mg twice a day but his PCP sent the prescription of 100 mg twice a day.  Abilify  was 2 mg which is unchanged.  Patient also seeing Dr. Gutterman for therapy.  Patient reported his symptoms are somewhat stable but had episodes of irritability, increased energy, feeling high and elated mood.  Patient told sometimes he gets distracted.  Patient lives with his wife and this is his fourth marriage.  They have been married for 2 years but known for past 9 years.  Patient told his wife is a Risk analyst and noticed  that he have still some symptoms of mania.  There are days when he does not sleep very well.  He gets easily too excited with increased energy.  He admitted impulsive eating and had gained weight.  He has no tremors, shakes or any EPS.  He denies any suicidal thoughts, hallucination, paranoia, nightmares, flashbacks.  He admitted there are times when he feels highs and lows but most of the time he is able to control his symptoms.  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  He admitted sometimes struggle and challenges in his job but still able to function.  He denies illegal substance use but admitted drinking 1 beer with his wife every evening on the porch.  His lifestyle is sedentary and does not have any routine for exercise, walking or going to gym.  He had few but very good close friends.  He has a 49 year old son who lives close by and 4 year old son who lives out of town with his 3 kids.  So far no major concern from the medication.  Patient also reported history of panic attacks and last February he had 1 of those panic attacks when he feels very anxious, increased heart rate but did not specify the reason.  He was able to calm down his symptoms after a while.  Patient denies any triggers or any stress going on his life.  He reported wife is very supportive.  However he  did admit that sometimes he is concerned about his mother who had served in the [employer] and exposed to significant trauma as a Engineer, civil (consulting).  Patient told his mother is given the diagnosis of PTSD.  Associated Signs/Symptoms: Depression Symptoms:  insomnia, anxiety, panic attacks, (Hypo) Manic Symptoms:  Distractibility, Elevated Mood, Grandiosity, Impulsivity, Labiality of Mood, Anxiety Symptoms:  Panic Symptoms, Psychotic Symptoms:   none PTSD Symptoms: Negative  Past Psychiatric History: History of inpatient in 2004 due to overdose. He do not recall history of suicidal  attempt.  History of mania, depression, highs and lows, panic attacks.  History of impulsive eating, excessive buying, making impulsive decision.  History of grandiosity and hyperactivity.  Tried Lexapro, Celexa , lithium , risperidone  in the past.  Saw Dr. Elva Hamburger who retired.  On Lamictal  and Abilify .  Previous Psychotropic Medications: Yes   Substance Abuse History in the last 12 months:  No.  Consequences of Substance Abuse: NA  Past Medical History:  Past Medical History:  Diagnosis Date   Arthritis    Asthma    as a child   Bipolar disorder, unspecified (HCC)    hospitalization 2004 for depression, bipolar, ambien overdose   Cancer (HCC) 10/20/2015   skin ca on nose removed   Cholesteatoma 05/2010   s/p R ear surgery, residual tinnitus, planning on L ear surgery   Depression    bipolar   Family history of ischemic heart disease    History of asthma    Migraine without aura, without mention of intractable migraine without mention of status migrainosus    Rosacea 2017   Spinal stenosis    Unspecified adjustment reaction     Past Surgical History:  Procedure Laterality Date   CARPOMETACARPEL SUSPENSION PLASTY Left 10/18/2017   Procedure: LEFT THUMB TRAPEZIECTOMY AND SUSPENSIONPLASTY;  Surgeon: Brunilda Capra, MD;  Location: [facility];  Service: Orthopedics;  Laterality: Left;   CARPOMETACARPEL SUSPENSION PLASTY Right 03/28/2018   Procedure: CARPOMETACARPEL [facility]) SUSPENSION PLASTY AND TRAPEZIECTOMY;  Surgeon: Brunilda Capra, MD;  Location: [facility];  Service: Orthopedics;  Laterality: Right;   COLONOSCOPY  1999   normal (in [place] )   ESOPHAGUS SURGERY  2003   torn esophagus   INNER EAR SURGERY  1975, 1995, 2012, 2013   for cholesteatoma, bilateral first L then R   NECK SURGERY  2002   spinal stenosis and arthritis   perianal abscess  1998    Family Psychiatric History: Mother had depression.  Brother had PTSD.  Family History:   Family History  Problem Relation Age of Onset   Sickle cell anemia Mother    Asthma Mother        smoker   Mental illness Mother    Coronary artery disease Father 12   Bladder Cancer Father    Post-traumatic stress disorder Brother    Lung cancer Paternal Grandmother 47       smoker   Prostate cancer Paternal Grandfather 2       deceased from prostate CA   Breast cancer Paternal Uncle    Stroke Neg Hx    Diabetes Neg Hx    Colon cancer Neg Hx    Colon polyps Neg Hx    Esophageal cancer Neg Hx    Rectal cancer Neg Hx    Stomach cancer Neg Hx     Social History:   Social History   Socioeconomic History   Marital status: Married    Spouse name: Not on file  Number of children: 2   Years of education: Not on file   Highest education level: Not on file  Occupational History   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     Employer: [employer]  Tobacco Use   Smoking status: Never   Smokeless tobacco: Never  Vaping Use   Vaping status: Never Used  Substance and Sexual Activity   Alcohol use: Yes    Alcohol/week: 0.0 standard drinks of alcohol    Comment: 1 beer/day   Drug use: No   Sexual activity: Not on file  Other Topics Concern   Not on file  Social History Narrative   Lives with wife    Occ: 5th grade teacher at [employer], now Geophysicist/field seismologist principal at [employer].    Activity: occ running   Diet: Not a lot of fast food   Social Drivers of Health   Financial Resource Strain: Not on file  Food Insecurity: Not on file  Transportation Needs: Not on file  Physical Activity: Not on file  Stress: Not on file  Social Connections: Not on file    Additional Social History: Patient born and raised in [place] .  Father was in the [employer] and moved around to too many places.  Finished college and did Scientist, water quality in music education from [employer] and Dentist from [employer].  Parents are divorced and both of them at multiple  marriages.  Mother lives in [place]  and father live in [place] .  Patient has 3 previous marriage which he believed did not work because of this mental disorder.  He is living with his wife for past 2 years.  His wife is a Risk analyst.  Allergies:   Allergies  Allergen Reactions   Morphine And Codeine  Itching    Metabolic Disorder Labs: No results found for: "HGBA1C", "MPG" No results found for: "PROLACTIN" Lab Results  Component Value Date   CHOL 193 10/27/2022   TRIG 209.0 (H) 10/27/2022   HDL 48.10 10/27/2022   CHOLHDL 4 10/27/2022   VLDL 41.8 (H) 10/27/2022   LDLCALC 119 (H) 10/20/2021   LDLCALC 95 04/29/2018   Lab Results  Component Value Date   TSH 1.50 10/27/2022    Therapeutic Level Labs: Lab Results  Component Value Date   LITHIUM  <0.3 (L) 10/27/2022   No results found for: "CBMZ" Lab Results  Component Value Date   VALPROATE 89.8 09/19/2010    Current Medications: Current Outpatient Medications  Medication Sig Dispense Refill   ARIPiprazole  (ABILIFY ) 2 MG tablet Take 1 tablet (2 mg total) by mouth at bedtime. 90 tablet 1   doxycycline (VIBRA-TABS) 100 MG tablet Take 100 mg by mouth 2 (two) times daily. As needed     fluticasone  (FLONASE ) 50 MCG/ACT nasal spray Place 2 sprays into both nostrils daily. 16 g 0   ibuprofen  (ADVIL ) 200 MG tablet Take 3 tablets (600 mg total) by mouth 2 (two) times daily as needed for moderate pain.     lamoTRIgine  (LAMICTAL ) 100 MG tablet Take 1 tablet (100 mg total) by mouth 2 (two) times daily. 180 tablet 1   levocetirizine (XYZAL ) 5 MG tablet Take 1 tablet (5 mg total) by mouth every evening. 30 tablet 0   valACYclovir  (VALTREX ) 500 MG tablet TAKE 1 TABLET BY MOUTH TWICE DAILY 3 DAYS AT A TIME AS NEEDED 30 tablet 3   No current facility-administered medications for this visit.    Musculoskeletal: Strength & Muscle Tone: within normal limits Gait & Station: normal Patient  leans: N/A  Psychiatric Specialty  Exam: Review of Systems  Musculoskeletal:  Positive for back pain.    Weight 232 lb (105.2 kg).There is no height or weight on file to calculate BMI.  General Appearance: Well Groomed  Eye Contact:  Fair  Speech:  Normal Rate  Volume:  Normal  Mood:  Euphoric  Affect:  Labile  Thought Process:  Descriptions of Associations: Intact  Orientation:  Full (Time, Place, and Person)  Thought Content:  WDL  Suicidal Thoughts:  No  Homicidal Thoughts:  No  Memory:  Immediate;   Good Recent;   Good Remote;   Fair  Judgement:  Intact  Insight:  Present  Psychomotor Activity:  Increased and Restlessness  Concentration:  Concentration: Fair and Attention Span: Fair  Recall:  Good  Fund of Knowledge:Good  Language: Good  Akathisia:  No  Handed:  Right  AIMS (if indicated):  not done  Assets:  Communication Skills Desire for Improvement Housing Resilience Social Support Talents/Skills Transportation  ADL's:  Intact  Cognition: WNL  Sleep:  Fair   Screenings: GAD-7    Garment/textile technologist Visit from 10/30/2022 in [facility] Visit from 10/28/2021 in [facility] Visit from 10/22/2020 in [facility] Office Visit from 09/11/2018 in [facility]  Total GAD-7 Score 2 11 13 10       PHQ2-9    Flowsheet Row Office Visit from 10/30/2022 in [facility] Office Visit from 10/28/2021 in [facility] Visit from 10/22/2020 in [facility] Visit from 10/21/2019 in [facility] Visit from 09/11/2018 in [facility] at [place]  PHQ-2 Total Score 1 2 1  0 2  PHQ-9 Total Score 4 9 2  -- 7      Flowsheet Row ED from 09/24/2018 in [facility] Department at [facility]  C-SSRS RISK CATEGORY No Risk       Assessment and Plan: Patient is 58 year old Caucasian man with a history of panic attacks and bipolar disorder.  His psychiatrist is retired and recently his primary care filled the prescription but patient reported dose is incorrect it.  He is taking Lamictal  25 mg twice a day and it was recently given 100 mg twice a day.  Patient has not started the new medication and like to discuss with a psychiatrist before he can start taking it.  His Abilify  is 2 mg.  He is seeing Dr. Gutterman for therapy.  I review psychosocial, current medication, lab results, review past history.  Patient is still have visible symptoms of hypomania, irritability, hyperactivity and panic attacks.  Recommend to try Lamictal  75 mg daily for 1 week and then 100 mg daily.  Reminded if he had any rash that he need to stop the medication immediately.  Patient has enough medication prescribed by PCP recently.  Continue Abilify  2 mg daily and continue therapy with Dr. Gutterman.  I recommend to have his paperwork filled and faxed to us .  I will follow-up in 3 to 4 weeks.  Recommend to call us  back if is any question or any concern.  Collaboration of Care: Other provider involved in patient's care AEB notes are available in epic to review  Patient/Guardian was advised Release of Information must be obtained  prior to any record release in order to collaborate their care with an outside provider. Patient/Guardian was advised if they have not already done so to contact the registration department to sign all necessary forms in order for us  to release information regarding their care.   Consent: Patient/Guardian gives verbal consent for treatment and assignment of benefits for services provided during this visit. Patient/Guardian expressed understanding and agreed to proceed.    Follow Up Instructions:    I discussed the assessment and treatment plan with the patient. The patient was  provided an opportunity to ask questions and all were answered. The patient agreed with the plan and demonstrated an understanding of the instructions.   The patient was advised to call back or seek an in-person evaluation if the symptoms worsen or if the condition fails to improve as anticipated.  I provided 62 minutes of non-face-to-face time during this encounter.  Arturo Late, MD 3/10/20251:07 PM

## 2023-07-23 ENCOUNTER — Ambulatory Visit (INDEPENDENT_AMBULATORY_CARE_PROVIDER_SITE_OTHER): Payer: BC Managed Care – PPO | Admitting: Psychology

## 2023-07-23 DIAGNOSIS — F319 Bipolar disorder, unspecified: Secondary | ICD-10-CM | POA: Diagnosis not present

## 2023-07-23 NOTE — Progress Notes (Signed)
 Name: Erik Smith Date: 07/23/2023 MRN: [id number] DOB: 1966-04-23 PCP: [name], MD     [name] participated from home, via video, and consented to treatment. Therapist participated from home office.   Guardian/Payee:  N/A    Paperwork requested: No   Reason for Visit /Presenting Problem: Improve/manage feeling of agitation  Mental Status Exam: Appearance:   Fairly Groomed     Behavior:  Appropriate  Motor:  Normal  Speech/Language:   Normal Rate  Affect:  Appropriate  Mood:  normal  Thought process:  normal  Thought content:    WNL  Sensory/Perceptual disturbances:    WNL  Orientation:  oriented to person, place, and situation  Attention:  Good  Concentration:  Good  Memory:  WNL  Fund of knowledge:   Good  Insight:    Good  Judgment:   Good  Impulse Control:  Good     Reported Symptoms:  Agitation, frustration, worry  Risk Assessment: Danger to Self:  No Self-injurious Behavior: No Danger to Others: No Duty to Warn:no Physical Aggression / Violence:No  Access to Firearms a concern:  unknown Gang Involvement:No  Patient / guardian was educated about steps to take if suicide or homicide risk level increases between visits: n/a While future psychiatric events cannot be accurately predicted, the patient does not currently require acute inpatient psychiatric care and does not currently meet [facility] involuntary commitment criteria.  Substance Abuse History: Current substance abuse: No     Past Psychiatric History:   Previous psychological history is significant for Bipolar Mania Outpatient Providers:Dr. Evalina Field History of Psych Hospitalization: Yes  Psychological Testing:  N/A    Abuse History:  Victim of: No.,  N/A    Report needed: No. Victim of Neglect:No. Perpetrator of  N/A   Witness / Exposure to Domestic Violence: No   Protective Services Involvement: No  Witness to [employer] Violence:  No    Family History:  Family History  Problem Relation Age of Onset   Sickle cell anemia Mother    Asthma Mother        smoker   Mental illness Mother    Coronary artery disease Father 59   Bladder Cancer Father    Post-traumatic stress disorder Brother    Lung cancer Paternal Grandmother 43       smoker   Prostate cancer Paternal Grandfather 72       deceased from prostate CA   Breast cancer Paternal Uncle    Stroke Neg Hx    Diabetes Neg Hx    Colon cancer Neg Hx    Colon polyps Neg Hx    Esophageal cancer Neg Hx    Rectal cancer Neg Hx    Stomach cancer Neg Hx     Living situation: the patient lives with their spouse  Sexual Orientation: Straight  Relationship Status: married  Name of spouse / other:[name] If a parent, number of children / ages:2 boys, 55 & 15  Support Systems: family  Financial Stress:  No   Income/Employment/Disability: Employment  Financial planner:  unknown  Educational History: Education: post Engineer, maintenance (IT) work or degree  Religion/Sprituality/World View: unknown  Any cultural differences that may affect / interfere with treatment:  not applicable   Recreation/Hobbies: woodworking  Stressors: Occupational concerns    Strengths: Family  Barriers:  unknown   Legal History: Pending legal issue / charges: The patient has no  significant history of legal issues. History of legal issue / charges:  N/A  Medical History/Surgical History: not reviewed Past Medical History:  Diagnosis Date   Arthritis    Asthma    as a child   Bipolar disorder, unspecified (HCC)    hospitalization 2004 for depression, bipolar, ambien overdose   Cancer (HCC) 10/20/2015   skin ca on nose removed   Cholesteatoma 05/2010   s/p R ear surgery, residual tinnitus, planning on L ear surgery   Depression    bipolar   Family history of ischemic heart disease    History of asthma    Migraine without aura, without mention of intractable migraine without  mention of status migrainosus    Rosacea 2017   Spinal stenosis    Unspecified adjustment reaction     Past Surgical History:  Procedure Laterality Date   CARPOMETACARPEL SUSPENSION PLASTY Left 10/18/2017   Procedure: LEFT THUMB TRAPEZIECTOMY AND SUSPENSIONPLASTY;  Surgeon: [name], MD;  Location: [facility];  Service: Orthopedics;  Laterality: Left;   CARPOMETACARPEL SUSPENSION PLASTY Right 03/28/2018   Procedure: CARPOMETACARPEL [facility]) SUSPENSION PLASTY AND TRAPEZIECTOMY;  Surgeon: [name], MD;  Location: [facility];  Service: Orthopedics;  Laterality: Right;   COLONOSCOPY  1999   normal (in [place])   ESOPHAGUS SURGERY  2003   torn esophagus   INNER EAR SURGERY  1975, 1995, 2012, 2013   for cholesteatoma, bilateral first L then R   NECK SURGERY  2002   spinal stenosis and arthritis   perianal abscess  1998    Medications: Current Outpatient Medications  Medication Sig Dispense Refill   ARIPiprazole (ABILIFY) 2 MG tablet Take 1 tablet (2 mg total) by mouth at bedtime. 90 tablet 1   doxycycline (VIBRA-TABS) 100 MG tablet Take 100 mg by mouth 2 (two) times daily. As needed     fluticasone (FLONASE) 50 MCG/ACT nasal spray Place 2 sprays into both nostrils daily. 16 g 0   ibuprofen (ADVIL) 200 MG tablet Take 3 tablets (600 mg total) by mouth 2 (two) times daily as needed for moderate pain.     lamoTRIgine (LAMICTAL) 100 MG tablet Take 1 tablet (100 mg total) by mouth 2 (two) times daily. (Patient taking differently: Take 100 mg by mouth daily.) 180 tablet 1   levocetirizine (XYZAL) 5 MG tablet Take 1 tablet (5 mg total) by mouth every evening. 30 tablet 0   valACYclovir (VALTREX) 500 MG tablet TAKE 1 TABLET BY MOUTH TWICE DAILY 3 DAYS AT A TIME AS NEEDED 30 tablet 3   No current facility-administered medications for this visit.    Allergies  Allergen Reactions   Morphine And Codeine Itching            Goals/Plan: Patient is  seeking counseling for mood stabilization. Will utilize cognitive strategies to help manage feelings of anxiety and despair. Revised target date is 12-25 Monitor medication compliance -Ongoing.   Patient agrees to a video Public affairs consultant) meeting and is aware of platform limitations. He is at home and provider is in home office.  Session Notes: Dayon says he had his meeting with Dr. Lolly Mustache and changing from 50mg  Lamotrigine to 100mg . He is having some adjustment with his GI system. I has been a week and he thinks his moods are "more balanced" than they were before. Is excited that [place] and he has a number of projects ahead to do. Things at home are calm and  his anxiety is manageable. Is not overly distressed about job situation and plans to keep looking. Emphasized greater self-care in the coming months.                                                                                                                       Diagnoses:  Bipolar Disorder  Plan of Care: Outpatient Therapy and psychotropic medication   Garrel Ridgel, PhD Time:4:10p-5:00p 50 min.

## 2023-08-06 ENCOUNTER — Ambulatory Visit (INDEPENDENT_AMBULATORY_CARE_PROVIDER_SITE_OTHER): Payer: BC Managed Care – PPO | Admitting: Psychology

## 2023-08-06 DIAGNOSIS — F319 Bipolar disorder, unspecified: Secondary | ICD-10-CM | POA: Diagnosis not present

## 2023-08-06 NOTE — Progress Notes (Signed)
 Name: Erik Smith Date: 08/06/2023 MRN: [id number] DOB: 1965-10-14 PCP: [name], MD     [name] participated from home, via video, and consented to treatment. Therapist participated from home office.   Guardian/Payee:  N/A    Paperwork requested: No   Reason for Visit /Presenting Problem: Improve/manage feeling of agitation  Mental Status Exam: Appearance:   Fairly Groomed     Behavior:  Appropriate  Motor:  Normal  Speech/Language:   Normal Rate  Affect:  Appropriate  Mood:  normal  Thought process:  normal  Thought content:    WNL  Sensory/Perceptual disturbances:    WNL  Orientation:  oriented to person, place, and situation  Attention:  Good  Concentration:  Good  Memory:  WNL  Fund of knowledge:   Good  Insight:    Good  Judgment:   Good  Impulse Control:  Good     Reported Symptoms:  Agitation, frustration, worry  Risk Assessment: Danger to Self:  No Self-injurious Behavior: No Danger to Others: No Duty to Warn:no Physical Aggression / Violence:No  Access to Firearms a concern:  unknown Gang Involvement:No  Patient / guardian was educated about steps to take if suicide or homicide risk level increases between visits: n/a While future psychiatric events cannot be accurately predicted, the patient does not currently require acute inpatient psychiatric care and does not currently meet [facility] involuntary commitment criteria.  Substance Abuse History: Current substance abuse: No     Past Psychiatric History:   Previous psychological history is significant for Bipolar Mania Outpatient Providers:Dr. Evalina Field History of Psych Hospitalization: Yes  Psychological Testing:  N/A    Abuse History:  Victim of: No.,  N/A    Report needed: No. Victim of Neglect:No. Perpetrator of  N/A   Witness / Exposure to Domestic Violence: No   Protective Services Involvement: No   Witness to [employer] Violence:  No   Family History:  Family History  Problem Relation Age of Onset   Sickle cell anemia Mother    Asthma Mother        smoker   Mental illness Mother    Coronary artery disease Father 2   Bladder Cancer Father    Post-traumatic stress disorder Brother    Lung cancer Paternal Grandmother 95       smoker   Prostate cancer Paternal Grandfather 58       deceased from prostate CA   Breast cancer Paternal Uncle    Stroke Neg Hx    Diabetes Neg Hx    Colon cancer Neg Hx    Colon polyps Neg Hx    Esophageal cancer Neg Hx    Rectal cancer Neg Hx    Stomach cancer Neg Hx     Living situation: the patient lives with their spouse  Sexual Orientation: Straight  Relationship Status: married  Name of spouse / other:[name] If a parent, number of children / ages:2 boys, 64 & 15  Support Systems: family  Financial Stress:  No   Income/Employment/Disability: Employment  Financial planner:  unknown  Educational History: Education: post Engineer, maintenance (IT) work or degree  Religion/Sprituality/World View: unknown  Any cultural differences that may affect / interfere with treatment:  not applicable   Recreation/Hobbies: woodworking  Stressors: Occupational concerns    Strengths: Family  Barriers:  unknown   Legal History: Pending legal issue / charges: The patient has no significant history of legal issues. History of legal issue / charges:  N/A  Medical History/Surgical History: not reviewed Past Medical History:  Diagnosis Date   Arthritis    Asthma    as a child   Bipolar disorder, unspecified (HCC)    hospitalization 2004 for depression, bipolar, ambien overdose   Cancer (HCC) 10/20/2015   skin ca on nose removed   Cholesteatoma 05/2010   s/p R ear surgery, residual tinnitus, planning on L ear surgery   Depression    bipolar   Family history of ischemic heart disease    History of asthma    Migraine without aura, without  mention of intractable migraine without mention of status migrainosus    Rosacea 2017   Spinal stenosis    Unspecified adjustment reaction     Past Surgical History:  Procedure Laterality Date   CARPOMETACARPEL SUSPENSION PLASTY Left 10/18/2017   Procedure: LEFT THUMB TRAPEZIECTOMY AND SUSPENSIONPLASTY;  Surgeon: [name], MD;  Location: [facility];  Service: Orthopedics;  Laterality: Left;   CARPOMETACARPEL SUSPENSION PLASTY Right 03/28/2018   Procedure: CARPOMETACARPEL [facility]) SUSPENSION PLASTY AND TRAPEZIECTOMY;  Surgeon: [name], MD;  Location: [facility];  Service: Orthopedics;  Laterality: Right;   COLONOSCOPY  1999   normal (in [place])   ESOPHAGUS SURGERY  2003   torn esophagus   INNER EAR SURGERY  1975, 1995, 2012, 2013   for cholesteatoma, bilateral first L then R   NECK SURGERY  2002   spinal stenosis and arthritis   perianal abscess  1998    Medications: Current Outpatient Medications  Medication Sig Dispense Refill   ARIPiprazole (ABILIFY) 2 MG tablet Take 1 tablet (2 mg total) by mouth at bedtime. 90 tablet 1   doxycycline (VIBRA-TABS) 100 MG tablet Take 100 mg by mouth 2 (two) times daily. As needed     fluticasone (FLONASE) 50 MCG/ACT nasal spray Place 2 sprays into both nostrils daily. 16 g 0   ibuprofen (ADVIL) 200 MG tablet Take 3 tablets (600 mg total) by mouth 2 (two) times daily as needed for moderate pain.     lamoTRIgine (LAMICTAL) 100 MG tablet Take 1 tablet (100 mg total) by mouth 2 (two) times daily. (Patient taking differently: Take 100 mg by mouth daily.) 180 tablet 1   levocetirizine (XYZAL) 5 MG tablet Take 1 tablet (5 mg total) by mouth every evening. 30 tablet 0   valACYclovir (VALTREX) 500 MG tablet TAKE 1 TABLET BY MOUTH TWICE DAILY 3 DAYS AT A TIME AS NEEDED 30 tablet 3   No current facility-administered medications for this visit.    Allergies  Allergen Reactions   Morphine And Codeine Itching             Goals/Plan: Patient is seeking counseling for mood stabilization. Will utilize cognitive strategies to help manage feelings of anxiety and despair. Revised target date is 12-25 Monitor medication compliance -Ongoing.   Patient agrees to a video Public affairs consultant) meeting and is aware of platform limitations. He is at home and provider is in home office.  Session Notes: Davelle feels that being more physically active and the medication has helped him feel feel better. He took a "mental healthd day" today and di lots of chores around the house. They did the move of their camper to the beach. He has not been applying for jobs lately and says it is time to  update resume'. He and wife are now talking about working in the [employer] area after his son graduates and wife retires. States that relationship with [employer] going well. They are communicating with each other and working well together.                                                                                                                          Diagnoses:  Bipolar Disorder  Plan of Care: Outpatient Therapy and psychotropic medication   Garrel Ridgel, PhD Time:4:10p-5:00p 50 min.

## 2023-08-20 ENCOUNTER — Ambulatory Visit (INDEPENDENT_AMBULATORY_CARE_PROVIDER_SITE_OTHER): Payer: BC Managed Care – PPO | Admitting: Psychology

## 2023-08-20 DIAGNOSIS — F319 Bipolar disorder, unspecified: Secondary | ICD-10-CM | POA: Diagnosis not present

## 2023-08-20 NOTE — Progress Notes (Signed)
 Name: Erik Smith Date: 08/20/2023 MRN: [id number] DOB: 06-02-1965 PCP: Erik Crick, MD     Erik Smith participated from home, via video, and consented to treatment. Therapist participated from home office.   Guardian/Payee:  N/A    Paperwork requested: No   Reason for Visit /Presenting Problem: Improve/manage feeling of agitation  Mental Status Exam: Appearance:   Fairly Groomed     Behavior:  Appropriate  Motor:  Normal  Speech/Language:   Normal Rate  Affect:  Appropriate  Mood:  normal  Thought process:  normal  Thought content:    WNL  Sensory/Perceptual disturbances:    WNL  Orientation:  oriented to person, place, and situation  Attention:  Good  Concentration:  Good  Memory:  WNL  Fund of knowledge:   Good  Insight:    Good  Judgment:   Good  Impulse Control:  Good     Reported Symptoms:  Agitation, frustration, worry  Risk Assessment: Danger to Self:  No Self-injurious Behavior: No Danger to Others: No Duty to Warn:no Physical Aggression / Violence:No  Access to Firearms a concern:  unknown Gang Involvement:No  Patient / guardian was educated about steps to take if suicide or homicide risk level increases between visits: n/a While future psychiatric events cannot be accurately predicted, the patient does not currently require acute inpatient psychiatric care and does not currently meet [facility]  involuntary commitment criteria.  Substance Abuse History: Current substance abuse: No     Past Psychiatric History:   Previous psychological history is significant for Bipolar Mania Outpatient Providers:Dr. Ulyess Gammons History of Psych Hospitalization: Yes  Psychological Testing:  N/A    Abuse History:  Victim of: No.,  N/A    Report needed: No. Victim of Neglect:No. Perpetrator of  N/A   Witness / Exposure to Domestic Violence: No   Protective  Services Involvement: No  Witness to [employer] Violence:  No   Family History:  Family History  Problem Relation Age of Onset   Sickle cell anemia Mother    Asthma Mother        smoker   Mental illness Mother    Coronary artery disease Father 106   Bladder Cancer Father    Post-traumatic stress disorder Brother    Lung cancer Paternal Grandmother 69       smoker   Prostate cancer Paternal Grandfather 103       deceased from prostate CA   Breast cancer Paternal Uncle    Stroke Neg Hx    Diabetes Neg Hx    Colon cancer Neg Hx    Colon polyps Neg Hx    Esophageal cancer Neg Hx    Rectal cancer Neg Hx    Stomach cancer Neg Hx     Living situation: the patient lives with their spouse  Sexual Orientation: Straight  Relationship Status: married  Name of spouse / other:Erik Smith If a parent, number of children / ages:2 boys, 88 & 15  Support Systems: family  Financial Stress:  No   Income/Employment/Disability: Employment  Financial planner:  unknown  Educational History: Education: post Engineer, maintenance (IT) work or degree  Religion/Sprituality/World View: unknown  Any cultural differences that may affect / interfere with treatment:  not applicable  Recreation/Hobbies: woodworking  Stressors: Occupational concerns    Strengths: Family  Barriers:  unknown   Legal History: Pending legal issue / charges: The patient has no significant history of legal issues. History of legal issue / charges:  N/A  Medical History/Surgical History: not reviewed Past Medical History:  Diagnosis Date   Arthritis    Asthma    as a child   Bipolar disorder, unspecified (HCC)    hospitalization 2004 for depression, bipolar, ambien overdose   Cancer (HCC) 10/20/2015   skin ca on nose removed   Cholesteatoma 05/2010   s/p R ear surgery, residual tinnitus, planning on L ear surgery   Depression    bipolar   Family history of ischemic heart disease    History of asthma    Migraine  without aura, without mention of intractable migraine without mention of status migrainosus    Rosacea 2017   Spinal stenosis    Unspecified adjustment reaction     Past Surgical History:  Procedure Laterality Date   CARPOMETACARPEL SUSPENSION PLASTY Left 10/18/2017   Procedure: LEFT THUMB TRAPEZIECTOMY AND SUSPENSIONPLASTY;  Surgeon: [name], MD;  Location: [facility];  Service: Orthopedics;  Laterality: Left;   CARPOMETACARPEL SUSPENSION PLASTY Right 03/28/2018   Procedure: CARPOMETACARPEL [facility]) SUSPENSION PLASTY AND TRAPEZIECTOMY;  Surgeon: [name], MD;  Location: [facility];  Service: Orthopedics;  Laterality: Right;   COLONOSCOPY  1999   normal (in [place])   ESOPHAGUS SURGERY  2003   torn esophagus   INNER EAR SURGERY  1975, 1995, 2012, 2013   for cholesteatoma, bilateral first L then R   NECK SURGERY  2002   spinal stenosis and arthritis   perianal abscess  1998    Medications: Current Outpatient Medications  Medication Sig Dispense Refill   ARIPiprazole (ABILIFY) 2 MG tablet Take 1 tablet (2 mg total) by mouth at bedtime. 90 tablet 1   doxycycline (VIBRA-TABS) 100 MG tablet Take 100 mg by mouth 2 (two) times daily. As needed     fluticasone (FLONASE) 50 MCG/ACT nasal spray Place 2 sprays into both nostrils daily. 16 g 0   ibuprofen (ADVIL) 200 MG tablet Take 3 tablets (600 mg total) by mouth 2 (two) times daily as needed for moderate pain.     lamoTRIgine (LAMICTAL) 100 MG tablet Take 1 tablet (100 mg total) by mouth 2 (two) times daily. (Patient taking differently: Take 100 mg by mouth daily.) 180 tablet 1   levocetirizine (XYZAL) 5 MG tablet Take 1 tablet (5 mg total) by mouth every evening. 30 tablet 0   valACYclovir (VALTREX) 500 MG tablet TAKE 1 TABLET BY MOUTH TWICE DAILY 3 DAYS AT A TIME AS NEEDED 30 tablet 3   No current facility-administered medications for this visit.    Allergies  Allergen Reactions   Morphine And  Codeine Itching            Goals/Plan: Patient is seeking counseling for mood stabilization. Will utilize cognitive strategies to help manage feelings of anxiety and despair. Revised target date is 12-25 Monitor medication compliance -Ongoing.   Patient agrees to a video Public affairs consultant) meeting and is aware of platform limitations. He is at home and provider is in home office.  Session Notes: Jamorian is spending the week [place] a deck. He is pleased to be there and looking forward to spending more time there. He says he is getting "mixed reviews" on the medication. Not sure if he  is still "getting used to" the medication. Wife asks him if he is taking meds because it appears he is getting easily upset and on edge. He will keep a journal and report any progress (or not) at next session.                                                                                                                            Diagnoses:  Bipolar Disorder  Plan of Care: Outpatient Therapy and psychotropic medication   Jola Nash, PhD Time:4:10p-5:00p 50 min.

## 2023-08-26 ENCOUNTER — Encounter: Payer: Self-pay | Admitting: Family Medicine

## 2023-09-03 ENCOUNTER — Telehealth (HOSPITAL_COMMUNITY): Admitting: Psychiatry

## 2023-09-03 ENCOUNTER — Encounter (HOSPITAL_COMMUNITY): Payer: Self-pay | Admitting: Psychiatry

## 2023-09-03 ENCOUNTER — Ambulatory Visit (INDEPENDENT_AMBULATORY_CARE_PROVIDER_SITE_OTHER): Payer: BC Managed Care – PPO | Admitting: Psychology

## 2023-09-03 VITALS — Wt 230.0 lb

## 2023-09-03 DIAGNOSIS — F319 Bipolar disorder, unspecified: Secondary | ICD-10-CM

## 2023-09-03 DIAGNOSIS — F410 Panic disorder [episodic paroxysmal anxiety] without agoraphobia: Secondary | ICD-10-CM | POA: Diagnosis not present

## 2023-09-03 MED ORDER — LAMOTRIGINE 100 MG PO TABS: 100.0000 mg | ORAL_TABLET | Freq: Two times a day (BID) | ORAL | 0 refills | Status: DC

## 2023-09-03 MED ORDER — ARIPIPRAZOLE 2 MG PO TABS: 2.0000 mg | ORAL_TABLET | Freq: Every day | ORAL | 0 refills | Status: DC

## 2023-09-03 NOTE — Progress Notes (Signed)
 [name]   Patient Location: Home Provider Location: Home Office  I connect with patient by video and verified that I am speaking with correct person by using two identifiers. I discussed the limitations of evaluation and management by telemedicine and the availability of in person appointments. I also discussed with the patient that there may be a patient responsible charge related to this service. The patient expressed understanding and agreed to proceed.  Erik Smith [id number] 58 y.o.  09/03/2023 3:24 PM  History of Present Illness:  Patient is a 58 year old Caucasian, married, employed man who was seen first time 6 weeks ago.  Patient was referred from primary care as his previous psychiatrist retired and PCP was giving medication.  Patient has a history of panic attack and bipolar disorder.  PCP provided Lamictal  100 mg twice a day but he was actually taking 25 mg twice a day.  He has residual mood lability and highs and lows.  We encouraged and recommend to take the Lamictal  50 mg twice a day.  Patient is now taking Lamictal  50 mg twice a day as full tablet of 100 mg could not tolerate very well.  He had stomach issues.  Patient also reported sleep is better since medicine dose increase.  Denies any mania, psychosis, irritability, highs and lows.  Patient lives with his wife who is a Risk analyst.  Patient has a Engineer, site and teaches music.  Patient told through the spring break they went to [facility] area where they have camper.  Patient told he enjoyed a lot and had an off vitamin D because he was lying on the deck for few hours.  He feels energy level is good.  He does not get impulsive.  He is also considering weight loss program and may start taking Zepbound.  He admitted trying to lose weight but sometimes he did not have enough motivation.  He tends to get impulsive eating.  He has no major other concerns from the medication.  He is  comfortable to take the Lamictal  50 mg twice a day which is keeping his mood stable.  He has no major panic attack.  He has no rash, itching, tremors or shakes.  He is also taking Abilify .  He is seeing Dr. Gutterman for therapy.  Past Psychiatric History: History of inpatient in 2004.  History of overdose, mania, depression, panic attacks.  History of impulsive eating and excessive buying and taking multiple decisions.  Tried Lexapro, Celexa , lithium , risperidone .  Saw Dr. Elva Hamburger who retired.   Outpatient Encounter Medications as of 09/03/2023  Medication Sig   ARIPiprazole  (ABILIFY ) 2 MG tablet Take 1 tablet (2 mg total) by mouth at bedtime.   doxycycline (VIBRA-TABS) 100 MG tablet Take 100 mg by mouth 2 (two) times daily. As needed   fluticasone  (FLONASE ) 50 MCG/ACT nasal spray Place 2 sprays into both nostrils daily.   ibuprofen  (ADVIL ) 200 MG tablet Take 3 tablets (600 mg total) by mouth 2 (two) times daily as needed for moderate pain.   lamoTRIgine  (LAMICTAL ) 100 MG tablet Take 1 tablet (100 mg total) by mouth 2 (two) times daily. (Patient taking differently: Take 100 mg by mouth daily.)   levocetirizine (XYZAL ) 5 MG tablet Take 1 tablet (5 mg total) by mouth every evening.   valACYclovir  (VALTREX ) 500 MG tablet TAKE 1 TABLET BY MOUTH TWICE DAILY 3 DAYS AT A TIME AS NEEDED   No facility-administered encounter medications on file as of 09/03/2023.  No results found for this or any previous visit (from the past 2160 hours).   Psychiatric Specialty Exam: Physical Exam  Review of Systems  There were no vitals taken for this visit.There is no height or weight on file to calculate BMI.  General Appearance: Casual  Eye Contact:  Fair  Speech:  Normal Rate  Volume:  Normal  Mood:  Euthymic  Affect:  Congruent  Thought Process:  Goal Directed  Orientation:  Full (Time, Place, and Person)  Thought Content:  Logical  Suicidal Thoughts:  No  Homicidal Thoughts:  No  Memory:   Immediate;   Good Recent;   Good Remote;   Good  Judgement:  Intact  Insight:  Present  Psychomotor Activity:  Normal  Concentration:  Concentration: Good and Attention Span: Good  Recall:  Good  Fund of Knowledge:  Good  Language:  Good  Akathisia:  No  Handed:  Right  AIMS (if indicated):     Assets:  Communication Skills Desire for Improvement Housing Social Support Talents/Skills Transportation  ADL's:  Intact  Cognition:  WNL  Sleep:  7 hrs       10/30/2022    8:06 AM 10/28/2021    9:01 AM 10/22/2020    9:18 AM 10/21/2019   11:46 AM 09/11/2018   10:00 AM  Depression screen PHQ 2/9  Decreased Interest 0 1 0 0 0  Down, Depressed, Hopeless 1 1 1  0 2  PHQ - 2 Score 1 2 1  0 2  Altered sleeping 0 0 0  2  Tired, decreased energy 0 1 0  1  Change in appetite 2 3 0  1  Feeling bad or failure about yourself  1 2 1  1   Trouble concentrating 0 1 0  0  Moving slowly or fidgety/restless 0 0 0  0  Suicidal thoughts 0 0 0  0  PHQ-9 Score 4 9 2  7   Difficult doing work/chores Not difficult at all Somewhat difficult       Assessment/Plan: Bipolar 1 disorder (HCC) - Plan: ARIPiprazole  (ABILIFY ) 2 MG tablet, lamoTRIgine  (LAMICTAL ) 100 MG tablet  Panic attacks - Plan: lamoTRIgine  (LAMICTAL ) 100 MG tablet  Reviewed blood work results and current medication.  Patient showing and reporting better results with increased Lamictal .  He was taking 25 mg twice a day but now 50 mg twice a day.  He has some gastric issues but no other major concern.  He is hoping to start Zepbound to help with weight loss.  Continue the low-fat 2 mg daily.  He has no tremors, rash, itching, shakes.  Encouraged to continue therapy with Mr. Resa Cass.  Follow-up in 3 months.   Follow Up Instructions:     I discussed the assessment and treatment plan with the patient. The patient was provided an opportunity to ask questions and all were answered. The patient agreed with the plan and demonstrated an understanding of  the instructions.   The patient was advised to call back or seek an in-person evaluation if the symptoms worsen or if the condition fails to improve as anticipated.    Collaboration of Care: Other provider involved in patient's care AEB notes are available in epic to review  Patient/Guardian was advised Release of Information must be obtained prior to any record release in order to collaborate their care with an outside provider. Patient/Guardian was advised if they have not already done so to contact the registration department to sign all necessary forms in order for  us  to release information regarding their care.   Consent: Patient/Guardian gives verbal consent for treatment and assignment of benefits for services provided during this visit. Patient/Guardian expressed understanding and agreed to proceed.     Total encounter time 28 minutes which includes face-to-face time, chart reviewed, care coordination, order entry and documentation during this encounter.   Note: This document was prepared by [employer] voice dictation technology and any errors that results from this process are unintentional.    Arturo Late, MD 09/03/2023

## 2023-09-03 NOTE — Progress Notes (Unsigned)
 Name: Erik Smith Date: 09/03/2023 MRN: [id number] DOB: [birth year] PCP: Erik Crick, MD     Erik Smith participated from home, via video, and consented to treatment. Therapist participated from home office.   Guardian/Payee:  N/A    Paperwork requested: No   Reason for Visit /Presenting Problem: Improve/manage feeling of agitation  Mental Status Exam: Appearance:   Fairly Groomed     Behavior:  Appropriate  Motor:  Normal  Speech/Language:   Normal Rate  Affect:  Appropriate  Mood:  normal  Thought process:  normal  Thought content:    WNL  Sensory/Perceptual disturbances:    WNL  Orientation:  oriented to person, place, and situation  Attention:  Good  Concentration:  Good  Memory:  WNL  Fund of knowledge:   Good  Insight:    Good  Judgment:   Good  Impulse Control:  Good     Reported Symptoms:  Agitation, frustration, worry  Risk Assessment: Danger to Self:  No Self-injurious Behavior: No Danger to Others: No Duty to Warn:no Physical Aggression / Violence:No  Access to Firearms a concern:  unknown Gang Involvement:No  Patient / guardian was educated about steps to take if suicide or homicide risk level increases between visits: n/a While future psychiatric events cannot be accurately predicted, the patient does not currently require acute inpatient psychiatric care and does not currently meet [facility]  involuntary commitment criteria.  Substance Abuse History: Current substance abuse: No     Past Psychiatric History:   Previous psychological history is significant for Bipolar Mania Outpatient Providers:Erik Smith History of Psych Hospitalization: Yes  Psychological Testing:  N/A    Abuse History:  Victim of: No.,  N/A    Report needed: No. Victim of Neglect:No. Perpetrator of  N/A   Witness / Exposure to Domestic  Violence: No   Protective Services Involvement: No  Witness to [employer] Violence:  No   Family History:  Family History  Problem Relation Age of Onset   Sickle cell anemia Mother    Asthma Mother        smoker   Mental illness Mother    Coronary artery disease Father 34   Bladder Cancer Father    Post-traumatic stress disorder Brother    Lung cancer Paternal Grandmother 58       smoker   Prostate cancer Paternal Grandfather 59       deceased from prostate CA   Breast cancer Paternal Uncle    Stroke Neg Hx    Diabetes Neg Hx    Colon cancer Neg Hx    Colon polyps Neg Hx    Esophageal cancer Neg Hx    Rectal cancer Neg Hx    Stomach cancer Neg Hx     Living situation: the patient lives with their spouse  Sexual Orientation: Straight  Relationship Status: married  Name of spouse / other:Erik Smith If a parent, number of children / ages:2 boys, 64 & 15  Support Systems: family  Financial Stress:  No   Income/Employment/Disability: Employment  Financial planner:  unknown  Educational History: Education: post Engineer, maintenance (IT) work or degree  Religion/Sprituality/World View:  unknown  Any cultural differences that may affect / interfere with treatment:  not applicable   Recreation/Hobbies: woodworking  Stressors: Occupational concerns    Strengths: Family  Barriers:  unknown   Legal History: Pending legal issue / charges: The patient has no significant history of legal issues. History of legal issue / charges:  N/A  Medical History/Surgical History: not reviewed Past Medical History:  Diagnosis Date   Arthritis    Asthma    as a child   Bipolar disorder, unspecified (HCC)    hospitalization 2004 for depression, bipolar, ambien overdose   Cancer (HCC) 10/20/2015   skin ca on nose removed   Cholesteatoma 05/2010   s/p R ear surgery, residual tinnitus, planning on L ear surgery   Depression    bipolar   Family history of ischemic heart disease    History  of asthma    Migraine without aura, without mention of intractable migraine without mention of status migrainosus    Rosacea 2017   Spinal stenosis    Unspecified adjustment reaction     Past Surgical History:  Procedure Laterality Date   CARPOMETACARPEL SUSPENSION PLASTY Left 10/18/2017   Procedure: LEFT THUMB TRAPEZIECTOMY AND SUSPENSIONPLASTY;  Surgeon: Erik Capra, MD;  Location: [facility];  Service: Orthopedics;  Laterality: Left;   CARPOMETACARPEL SUSPENSION PLASTY Right 03/28/2018   Procedure: CARPOMETACARPEL [facility]) SUSPENSION PLASTY AND TRAPEZIECTOMY;  Surgeon: Erik Capra, MD;  Location: [facility];  Service: Orthopedics;  Laterality: Right;   COLONOSCOPY  1999   normal (in [place] )   ESOPHAGUS SURGERY  2003   torn esophagus   INNER EAR SURGERY  1975, 1995, 2012, 2013   for cholesteatoma, bilateral first L then R   NECK SURGERY  2002   spinal stenosis and arthritis   perianal abscess  1998    Medications: Current Outpatient Medications  Medication Sig Dispense Refill   ARIPiprazole  (ABILIFY ) 2 MG tablet Take 1 tablet (2 mg total) by mouth at bedtime. 90 tablet 0   fluticasone  (FLONASE ) 50 MCG/ACT nasal spray Place 2 sprays into both nostrils daily. 16 g 0   ibuprofen  (ADVIL ) 200 MG tablet Take 3 tablets (600 mg total) by mouth 2 (two) times daily as needed for moderate pain.     lamoTRIgine  (LAMICTAL ) 100 MG tablet Take 1 tablet (100 mg total) by mouth 2 (two) times daily. 180 tablet 0   levocetirizine (XYZAL ) 5 MG tablet Take 1 tablet (5 mg total) by mouth every evening. 30 tablet 0   valACYclovir  (VALTREX ) 500 MG tablet TAKE 1 TABLET BY MOUTH TWICE DAILY 3 DAYS AT A TIME AS NEEDED 30 tablet 3   No current facility-administered medications for this visit.    Allergies  Allergen Reactions   Morphine And Codeine  Itching            Goals/Plan: Patient is seeking counseling for mood stabilization. Will utilize cognitive  strategies to help manage feelings of anxiety and despair. Revised target date is 12-25 Monitor medication compliance -Ongoing.   Patient agrees to a video Public affairs consultant) meeting and is aware of platform limitations. He is at home and provider is in home office.  Session Notes: Erik Smith met with Erik Smith and reviewed meds. No changes in meds or dosages. He says "it has been a boring 2 weeks. He has framed most of the decking. He reports that he is feeling good and that his moods are stable. Relationship at home is strong and they are, together, looking  forward to summer at beach. Reports that son is doing exceptionally well at school and he is very proud.                                                                                                                              Diagnoses:  Bipolar Disorder  Plan of Care: Outpatient Therapy and psychotropic medication   Jola Nash, PhD Time:4:10p-5:00p 50 min.

## 2023-09-11 ENCOUNTER — Encounter: Payer: Self-pay | Admitting: Family Medicine

## 2023-09-11 ENCOUNTER — Telehealth (INDEPENDENT_AMBULATORY_CARE_PROVIDER_SITE_OTHER): Payer: Self-pay | Admitting: Family Medicine

## 2023-09-11 ENCOUNTER — Telehealth: Payer: Self-pay | Admitting: Family Medicine

## 2023-09-11 ENCOUNTER — Other Ambulatory Visit (HOSPITAL_COMMUNITY): Payer: Self-pay

## 2023-09-11 VITALS — BP 130/85 | HR 68 | Temp 98.0°F | Ht 70.0 in | Wt 230.0 lb

## 2023-09-11 DIAGNOSIS — Z8249 Family history of ischemic heart disease and other diseases of the circulatory system: Secondary | ICD-10-CM | POA: Diagnosis not present

## 2023-09-11 DIAGNOSIS — E785 Hyperlipidemia, unspecified: Secondary | ICD-10-CM

## 2023-09-11 DIAGNOSIS — E66811 Obesity, class 1: Secondary | ICD-10-CM

## 2023-09-11 DIAGNOSIS — Z6833 Body mass index (BMI) 33.0-33.9, adult: Secondary | ICD-10-CM

## 2023-09-11 MED ORDER — TIRZEPATIDE-WEIGHT MANAGEMENT 5 MG/0.5ML ~~LOC~~ SOLN: 5.0000 mg | SUBCUTANEOUS | 0 refills | Status: DC

## 2023-09-11 MED ORDER — TIRZEPATIDE-WEIGHT MANAGEMENT 2.5 MG/0.5ML ~~LOC~~ SOLN: 2.5000 mg | SUBCUTANEOUS | 0 refills | Status: DC

## 2023-09-11 NOTE — Assessment & Plan Note (Signed)
 Patient is interested in GLP1RA/GIP tirzepatide. Reviewed mechanism of action of medication as well as side effects and adverse events to watch for including nausea, diarrhea, constipation, pancreatitis. No fmhx medullary thyroid  cancer or MEN2. Discussed titration schedule for medication. Will start Zepbound (tirzepatide) 2.5mg  weekly x 1 month then increase to 5mg  weekly. Discussed need for regular visits for weight management to monitor medication effect and tolerance and weight loss, rec return 1 month after starting medication.  Reassess at previously scheduled annual exam at end of June.

## 2023-09-11 NOTE — Telephone Encounter (Signed)
 Patient is sending updated insurance information through MyChart then we will request proceeding with PA for Zepbound weight loss medication as he states insurance covers this.

## 2023-09-11 NOTE — Progress Notes (Signed)
 Ph: [phone] Fax: [phone]   Patient ID: Erik Smith, male    DOB: [birth year], 58 y.o.   MRN: [id number]  Virtual visit completed through MyChart, a video enabled telemedicine application. Due to national recommendations of social distancing due to COVID-19, a virtual visit is felt to be most appropriate for this patient at this time. Reviewed limitations, risks, security and privacy concerns of performing a virtual visit and the availability of in person appointments. I also reviewed that there may be a patient responsible charge related to this service. The patient agreed to proceed.   Patient location: at work in his office Provider location: Adult nurse at [facility], office Persons participating in this virtual visit: patient, provider   If any vitals were documented, they were collected by patient at home unless specified below.    BP 130/85   Pulse 68   Temp 98 F (36.7 C)   Ht 5\' 10"  (1.778 m)   Wt 230 lb (104.3 kg)   BMI 33.00 kg/m    CC: discuss weight loss medication Subjective:   HPI: Erik Smith is a 58 y.o. male presenting on 09/11/2023 for Medical Management of Chronic Issues (Wants to discuss starting Zepbound. )   Starting weight: 230 lbs  Has previously tried keto diet, weight watchers, [facility]ariat[facility] clinic ([facility]). Wegovy , Ozempic were unaffordable.  He checked with insurance who stated Zepbound is covered ~$26/mo.  Advice worker. Will send us  updated insurance information via MyChart.   24 hour recall: Not done.   Already limits sweets but continues struggling with portion sizes.   Activity regimen: Walking regularly - moved camper down to beach, working on deck, active outdoors all weekend long.       Relevant past medical, surgical, family and social history reviewed and updated as indicated. Interim medical history since our last visit reviewed. Allergies and medications reviewed and updated. Outpatient Medications  Prior to Visit  Medication Sig Dispense Refill   ARIPiprazole  (ABILIFY ) 2 MG tablet Take 1 tablet (2 mg total) by mouth at bedtime. 90 tablet 0   fluticasone  (FLONASE ) 50 MCG/ACT nasal spray Place 2 sprays into both nostrils daily. (Patient taking differently: Place 2 sprays into both nostrils daily. As needed) 16 g 0   ibuprofen  (ADVIL ) 200 MG tablet Take 3 tablets (600 mg total) by mouth 2 (two) times daily as needed for moderate pain.     lamoTRIgine  (LAMICTAL ) 100 MG tablet Take 1 tablet (100 mg total) by mouth 2 (two) times daily. 180 tablet 0   levocetirizine (XYZAL ) 5 MG tablet Take 1 tablet (5 mg total) by mouth every evening. (Patient taking differently: Take 5 mg by mouth every evening. As needed) 30 tablet 0   valACYclovir  (VALTREX ) 500 MG tablet TAKE 1 TABLET BY MOUTH TWICE DAILY 3 DAYS AT A TIME AS NEEDED 30 tablet 3   No facility-administered medications prior to visit.     Per HPI unless specifically indicated in ROS section below Review of Systems Objective:  BP 130/85   Pulse 68   Temp 98 F (36.7 C)   Ht 5\' 10"  (1.778 m)   Wt 230 lb (104.3 kg)   BMI 33.00 kg/m   Wt Readings from Last 3 Encounters:  09/11/23 230 lb (104.3 kg)  10/30/22 234 lb 2 oz (106.2 kg)  10/28/21 229 lb 2 oz (103.9 kg)       Physical exam: Gen: alert, NAD, not ill appearing Pulm: speaks in complete sentences without increased  work of breathing Psych: normal mood, normal thought content      Lab Results  Component Value Date   NA 140 10/27/2022   CL 104 10/27/2022   K 4.6 10/27/2022   CO2 28 10/27/2022   BUN 13 10/27/2022   CREATININE 0.91 10/27/2022   GFR 92.98 10/27/2022   CALCIUM 9.2 10/27/2022   ALBUMIN 4.4 10/27/2022   GLUCOSE 77 10/27/2022    Lab Results  Component Value Date   CHOL 193 10/27/2022   HDL 48.10 10/27/2022   LDLCALC 119 (H) 10/20/2021   LDLDIRECT 100.0 10/27/2022   TRIG 209.0 (H) 10/27/2022   CHOLHDL 4 10/27/2022    Assessment & Plan:    Dyslipidemia  Family history of premature CAD  Obesity, Class I, BMI 30-34.9 Assessment & Plan: Patient is interested in GLP1RA/GIP tirzepatide. Reviewed mechanism of action of medication as well as side effects and adverse events to watch for including nausea, diarrhea, constipation, pancreatitis. No fmhx medullary thyroid  cancer or MEN2. Discussed titration schedule for medication. Will start Zepbound (tirzepatide) 2.5mg  weekly x 1 month then increase to 5mg  weekly. Discussed need for regular visits for weight management to monitor medication effect and tolerance and weight loss, rec return 1 month after starting medication.  Reassess at previously scheduled annual exam at end of June.    Other orders -     Tirzepatide-Weight Management; Inject 2.5 mg into the skin once a week.  Dispense: 2 mL; Refill: 0 -     Tirzepatide-Weight Management; Inject 5 mg into the skin once a week. For 2nd month  Dispense: 2 mL; Refill: 0     I discussed the assessment and treatment plan with the patient. The patient was provided an opportunity to ask questions and all were answered. The patient agreed with the plan and demonstrated an understanding of the instructions. The patient was advised to call back or seek an in-person evaluation if the symptoms worsen or if the condition fails to improve as anticipated.  Follow up plan: No follow-ups on file.  Claire Crick, MD

## 2023-09-13 ENCOUNTER — Telehealth: Payer: Self-pay

## 2023-09-13 ENCOUNTER — Other Ambulatory Visit (HOSPITAL_COMMUNITY): Payer: Self-pay

## 2023-09-13 NOTE — Telephone Encounter (Signed)
 Pharmacy Patient Advocate Encounter   Received notification from Pt Calls Messages that prior authorization for Zepbound is required/requested.   Insurance verification completed.   The patient is insured through [facility] ADVANTAGE/RX ADVANCE .   Per test claim: Written prescription was for Zepbound vial and that is no longer available. Did test bill with pen injector and patient insurance covers a small portion of the cost, co-pay is $1,035.19.

## 2023-09-17 ENCOUNTER — Telehealth: Payer: Self-pay

## 2023-09-17 ENCOUNTER — Ambulatory Visit (INDEPENDENT_AMBULATORY_CARE_PROVIDER_SITE_OTHER): Payer: BC Managed Care – PPO | Admitting: Psychology

## 2023-09-17 ENCOUNTER — Ambulatory Visit: Payer: Self-pay

## 2023-09-17 DIAGNOSIS — F319 Bipolar disorder, unspecified: Secondary | ICD-10-CM | POA: Diagnosis not present

## 2023-09-17 NOTE — Telephone Encounter (Signed)
 Speaking with patient to give an update. This RN experienced a power failure. Called patient back and left a message to call back. Patient did call back and was transferred to another RN

## 2023-09-17 NOTE — Progress Notes (Signed)
 V                                                                              Name: Erik Smith Date: 09/17/2023 MRN: [id number] DOB: 1965/06/05 PCP: Claire Crick, MD     Lawayne President participated from home, via video, and consented to treatment. Therapist participated from home office.   Guardian/Payee:  N/A    Paperwork requested: No   Reason for Visit /Presenting Problem: Improve/manage feeling of agitation  Mental Status Exam: Appearance:   Fairly Groomed     Behavior:  Appropriate  Motor:  Normal  Speech/Language:   Normal Rate  Affect:  Appropriate  Mood:  normal  Thought process:  normal  Thought content:    WNL  Sensory/Perceptual disturbances:    WNL  Orientation:  oriented to person, place, and situation  Attention:  Good  Concentration:  Good  Memory:  WNL  Fund of knowledge:   Good  Insight:    Good  Judgment:   Good  Impulse Control:  Good     Reported Symptoms:  Agitation, frustration, worry  Risk Assessment: Danger to Self:  No Self-injurious Behavior: No Danger to Others: No Duty to Warn:no Physical Aggression / Violence:No  Access to Firearms a concern: unknown Gang Involvement:No  Patient / guardian was educated about steps to take if suicide or homicide risk level increases between visits: n/a While future psychiatric events cannot be accurately predicted, the patient does not currently require acute inpatient psychiatric care and does not currently meet [place]  involuntary commitment criteria.  Substance Abuse History: Current substance abuse: No     Past Psychiatric History:   Previous psychological history is significant for Bipolar Mania Outpatient Providers:Dr. Ulyess Gammons History of Psych Hospitalization: Yes  Psychological Testing: N/A   Abuse History:  Victim of: No., N/A   Report needed: No. Victim of Neglect:No. Perpetrator of N/A   Witness / Exposure to Domestic Violence: No   Protective Services Involvement: No  Witness to [employer] Violence:  No   Family History:  Family History  Problem Relation Age of Onset   Sickle cell anemia Mother    Asthma Mother        smoker   Mental illness Mother    Coronary artery disease Father 44   Bladder Cancer Father    Post-traumatic stress disorder Brother    Lung cancer Paternal Grandmother 32       smoker   Prostate cancer Paternal Grandfather 73       deceased from prostate CA   Breast cancer Paternal Uncle    Stroke Neg Hx    Diabetes Neg Hx    Colon cancer Neg Hx    Colon polyps Neg Hx    Esophageal cancer Neg Hx    Rectal cancer Neg Hx    Stomach cancer Neg Hx     Living situation: the patient lives with their spouse  Sexual Orientation: Straight  Relationship Status: married  Name of spouse / other:[name] If a parent, number of children / ages:2 boys, 57 & 15  Support Systems: family  Financial Stress:  No   Income/Employment/Disability: Employment  Financial planner: unknown  Educational History: Education:  post college graduate work or degree  Religion/Sprituality/World View: unknown  Any cultural differences that may affect / interfere with treatment:  not applicable   Recreation/Hobbies: woodworking  Stressors: Occupational concerns    Strengths: Family  Barriers:  unknown   Legal History: Pending legal issue / charges: The patient has no significant history of legal issues. History of legal issue / charges: N/A  Medical History/Surgical History: not reviewed Past Medical History:  Diagnosis Date   Arthritis    Asthma    as a child   Bipolar disorder, unspecified (HCC)    hospitalization 2004 for depression, bipolar, ambien overdose   Cancer (HCC) 10/20/2015   skin ca on nose removed   Cholesteatoma 05/2010   s/p R ear surgery, residual tinnitus, planning on L ear surgery   Depression    bipolar   Family history of  ischemic heart disease    History of asthma    Migraine without aura, without mention of intractable migraine without mention of status migrainosus    Rosacea 2017   Spinal stenosis    Unspecified adjustment reaction     Past Surgical History:  Procedure Laterality Date   CARPOMETACARPEL SUSPENSION PLASTY Left 10/18/2017   Procedure: LEFT THUMB TRAPEZIECTOMY AND SUSPENSIONPLASTY;  Surgeon: Brunilda Capra, MD;  Location: [facility];  Service: Orthopedics;  Laterality: Left;   CARPOMETACARPEL SUSPENSION PLASTY Right 03/28/2018   Procedure: CARPOMETACARPEL [facility]) SUSPENSION PLASTY AND TRAPEZIECTOMY;  Surgeon: Brunilda Capra, MD;  Location: [facility];  Service: Orthopedics;  Laterality: Right;   COLONOSCOPY  1999   normal (in [place] )   ESOPHAGUS SURGERY  2003   torn esophagus   INNER EAR SURGERY  1975, 1995, 2012, 2013   for cholesteatoma, bilateral first L then R   NECK SURGERY  2002   spinal stenosis and arthritis   perianal abscess  1998    Medications: Current Outpatient Medications  Medication Sig Dispense Refill   ARIPiprazole  (ABILIFY ) 2 MG tablet Take 1 tablet (2 mg total) by mouth at bedtime. 90 tablet 0   fluticasone  (FLONASE ) 50 MCG/ACT nasal spray Place 2 sprays into both nostrils daily. (Patient taking differently: Place 2 sprays into both nostrils daily. As needed) 16 g 0   ibuprofen  (ADVIL ) 200 MG tablet Take 3 tablets (600 mg total) by mouth 2 (two) times daily as needed for moderate pain.     lamoTRIgine  (LAMICTAL ) 100 MG tablet Take 1 tablet (100 mg total) by mouth 2 (two) times daily. 180 tablet 0   levocetirizine (XYZAL ) 5 MG tablet Take 1 tablet (5 mg total) by mouth every evening. (Patient taking differently: Take 5 mg by mouth every evening. As needed) 30 tablet 0   tirzepatide (ZEPBOUND) 2.5 MG/0.5ML injection vial Inject 2.5 mg into the skin once a week. 2 mL 0   [START ON 10/09/2023] tirzepatide 5 MG/0.5ML injection vial Inject 5 mg  into the skin once a week. For 2nd month 2 mL 0   valACYclovir  (VALTREX ) 500 MG tablet TAKE 1 TABLET BY MOUTH TWICE DAILY 3 DAYS AT A TIME AS NEEDED 30 tablet 3   No current facility-administered medications for this visit.    Allergies  Allergen Reactions   Morphine And Codeine  Itching            Goals/Plan: Patient is seeking counseling for mood stabilization. Will utilize cognitive strategies to help manage feelings of anxiety and despair. Revised target date is 12-25 Monitor medication compliance -Ongoing.   Patient agrees  to a video Public affairs consultant) meeting and is aware of platform limitations. He is at home and provider is in home office.  Session Notes: Kaesin has been having trouble getting Zepbound, which was prescribed and approved. He is disappointed, but managing. He says his meds are helping him keep calm in the face of a number of disappointments. Spending time working on his camper at [employer]. He is optimistic about the coming months with regard to having a calmer time. Says moods have been stable.                                                                                                                                  Diagnoses:  Bipolar Disorder  Plan of Care: Outpatient Therapy and psychotropic medication   Jola Nash, PhD Time:4:10p-5:00p 50 min.

## 2023-09-17 NOTE — Telephone Encounter (Signed)
 Pt called back after being disconnected with another nurse. Pt calling about his tirzepatide 2.5mg /0.28ml prescription. Chart review shows receipt confirmation on 5/6. Pt states the pharmacy was out of tirzepatide at the time and that the medication would require PA.  This RN called the pharmacy. Pharmacist states they have the medication in stock. Pharmacist billed the pt's insurance and discovered the co-pay is still $1,040. Pharmacist states the claim went through but that is the copay. RN advised the pt RN would relay the issue to the office and provider for follow-up, and also advised that the pt follow-up with their insurance company. Pt verbalized understanding.   Reason for Disposition  General information question, no triage required and triager able to answer question  Answer Assessment - Initial Assessment Questions 1. REASON FOR CALL or QUESTION: "What is your reason for calling today?" or "How can I best help you?" or "What question do you have that I can help answer?"     Pt calls back after being disconnected while talking to another RN.  Protocols used: Information Only Call - No Triage-A-AH

## 2023-09-17 NOTE — Telephone Encounter (Signed)
 Attempted to contact [facility] to see if prescription was received unable to contact. Left message for pt to rtc as script was sent in and confirmed on 09/11/23 @11 :19 AM   Copied from CRM #[id number]. Topic: Clinical - Prescription Issue >> Sep 17, 2023  3:04 PM Magdalene [employer] wrote: Reason for CRM: Patient calling regarding tirzepatide (ZEPBOUND) 2.5 MG/0.5ML injection vial. He stated that the pharmacy does not have record of the prescription. I confirmed pharmacy information  [id number] [place], [place] - [address] AT [facility] OF [facility] [address] [place] [address] Phone: [phone] Fax: [phone]
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
## 2023-09-19 MED ORDER — SAXENDA 18 MG/3ML ~~LOC~~ SOPN: PEN_INJECTOR | SUBCUTANEOUS | 1 refills | Status: DC

## 2023-09-19 NOTE — Addendum Note (Signed)
 Addended by: Claire Crick on: 09/19/2023 09:36 AM   Modules accepted: Orders

## 2023-09-19 NOTE — Telephone Encounter (Signed)
 Ok we could try daily Saxenda shots - would start at 0.6mg  daily for the first week then increase to 1.2mg  for a week then 1.8mg  for a week, then to update us  with how he's doing.  I have sent this in to his pharmacy.

## 2023-09-19 NOTE — Telephone Encounter (Signed)
Spoke with pt relaying Dr. G's message.  Pt verbalizes understanding and expresses his thanks.  

## 2023-09-27 ENCOUNTER — Encounter: Payer: Self-pay | Admitting: Family Medicine

## 2023-09-27 DIAGNOSIS — E66811 Obesity, class 1: Secondary | ICD-10-CM

## 2023-09-27 NOTE — Telephone Encounter (Signed)
 Pt is requesting Saxenda  rx be sent as it is more cost effective for him.

## 2023-09-28 MED ORDER — SAXENDA 18 MG/3ML ~~LOC~~ SOPN
PEN_INJECTOR | SUBCUTANEOUS | 0 refills | Status: DC
Start: 2023-09-28 — End: 2023-11-02

## 2023-09-28 NOTE — Telephone Encounter (Addendum)
 Spoke with [facility] asking about cost of Saxenda  rx since pt states he was told his cost would be $175/mo. They ran claim again and pt's cost is still showing >$1000.   Spoke with pt relaying info from [employer]. Pt states when he initially spoke with his insurance co, they mentioned they were getting several calls stating that same info but that members should be able to get rx for around $175/mo. Pt says he will contact insurance again next week to see what needs to be done to get rx at the lower cost. Pt will let us  know what he finds out and expresses his thanks for everything. Fyi to Dr Crissie Dome.

## 2023-09-28 NOTE — Telephone Encounter (Signed)
 When I order this it shows up as $1000+ for the prescription. Can we call the pharmacy to price this out? Pt was told it would be $175/month.

## 2023-10-15 ENCOUNTER — Ambulatory Visit (INDEPENDENT_AMBULATORY_CARE_PROVIDER_SITE_OTHER): Admitting: Psychology

## 2023-10-15 DIAGNOSIS — F319 Bipolar disorder, unspecified: Secondary | ICD-10-CM

## 2023-10-22 ENCOUNTER — Other Ambulatory Visit: Payer: Self-pay | Admitting: Family Medicine

## 2023-10-22 DIAGNOSIS — E785 Hyperlipidemia, unspecified: Secondary | ICD-10-CM

## 2023-10-22 DIAGNOSIS — Z125 Encounter for screening for malignant neoplasm of prostate: Secondary | ICD-10-CM

## 2023-10-22 DIAGNOSIS — F319 Bipolar disorder, unspecified: Secondary | ICD-10-CM
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
## 2023-10-23 NOTE — Telephone Encounter (Signed)
 Agree with Tdap when he comes in for labs this week.  Plz schedule nurse visit for this.

## 2023-10-24 NOTE — Telephone Encounter (Signed)
 Spoke with pt notifying him I added him to NV schedule at 8:45 but he will get shot before that time. Pt verbalizes understanding and expresses his thanks.

## 2023-10-25 ENCOUNTER — Ambulatory Visit (INDEPENDENT_AMBULATORY_CARE_PROVIDER_SITE_OTHER)

## 2023-10-25 ENCOUNTER — Other Ambulatory Visit (INDEPENDENT_AMBULATORY_CARE_PROVIDER_SITE_OTHER): Payer: BC Managed Care – PPO

## 2023-10-25 DIAGNOSIS — E785 Hyperlipidemia, unspecified: Secondary | ICD-10-CM

## 2023-10-25 DIAGNOSIS — Z23 Encounter for immunization: Secondary | ICD-10-CM

## 2023-10-25 DIAGNOSIS — F319 Bipolar disorder, unspecified: Secondary | ICD-10-CM

## 2023-10-25 DIAGNOSIS — Z125 Encounter for screening for malignant neoplasm of prostate: Secondary | ICD-10-CM | POA: Diagnosis not present

## 2023-10-25 LAB — COMPREHENSIVE METABOLIC PANEL WITH GFR
ALT: 19 U/L (ref 0–53)
AST: 18 U/L (ref 0–37)
Albumin: 4.3 g/dL (ref 3.5–5.2)
Alkaline Phosphatase: 64 U/L (ref 39–117)
BUN: 15 mg/dL (ref 6–23)
CO2: 28 meq/L (ref 19–32)
Calcium: 9 mg/dL (ref 8.4–10.5)
Chloride: 105 meq/L (ref 96–112)
Creatinine, Ser: 0.85 mg/dL (ref 0.40–1.50)
GFR: 95.2 mL/min (ref >60.00)
Glucose, Bld: 85 mg/dL (ref 70–99)
Potassium: 4.5 meq/L (ref 3.5–5.1)
Sodium: 138 meq/L (ref 135–145)
Total Bilirubin: 0.7 mg/dL (ref 0.2–1.2)
Total Protein: 6.5 g/dL (ref 6.0–8.3)

## 2023-10-25 LAB — CBC WITH DIFFERENTIAL/PLATELET
Basophils Absolute: 0 10*3/uL (ref 0.0–0.1)
Basophils Relative: 0.8 % (ref 0.0–3.0)
Eosinophils Absolute: 0.2 10*3/uL (ref 0.0–0.7)
Eosinophils Relative: 3.9 % (ref 0.0–5.0)
HCT: 41.8 % (ref 39.0–52.0)
Hemoglobin: 14.2 g/dL (ref 13.0–17.0)
Lymphocytes Relative: 31.8 % (ref 12.0–46.0)
Lymphs Abs: 1.8 10*3/uL (ref 0.7–4.0)
MCHC: 33.9 g/dL (ref 30.0–36.0)
MCV: 86.9 fl (ref 78.0–100.0)
Monocytes Absolute: 0.6 10*3/uL (ref 0.1–1.0)
Monocytes Relative: 10.5 % (ref 3.0–12.0)
Neutro Abs: 2.9 10*3/uL (ref 1.4–7.7)
Neutrophils Relative %: 53 % (ref 43.0–77.0)
Platelets: 219 10*3/uL (ref 150.0–400.0)
RBC: 4.81 Mil/uL (ref 4.22–5.81)
RDW: 13.7 % (ref 11.5–15.5)
WBC: 5.6 10*3/uL (ref 4.0–10.5)

## 2023-10-25 LAB — LIPID PANEL
Cholesterol: 201 mg/dL — ABNORMAL HIGH (ref 0–200)
HDL: 63.1 mg/dL (ref >39.00)
LDL Cholesterol: 123 mg/dL — ABNORMAL HIGH (ref 0–99)
NonHDL: 137.44
Total CHOL/HDL Ratio: 3
Triglycerides: 72 mg/dL (ref 0.0–149.0)
VLDL: 14.4 mg/dL (ref 0.0–40.0)

## 2023-10-25 LAB — PSA: PSA: 0.21 ng/mL (ref 0.10–4.00)

## 2023-10-25 NOTE — Progress Notes (Signed)
 Per orders of Aneta Bar, DPN AGNP-C,Tdap vaccine given by Kris Pester in right deltoid. Patient tolerated injection well.

## 2023-10-27 ENCOUNTER — Ambulatory Visit: Payer: Self-pay | Admitting: Family Medicine

## 2023-10-29 ENCOUNTER — Ambulatory Visit (INDEPENDENT_AMBULATORY_CARE_PROVIDER_SITE_OTHER): Admitting: Psychology

## 2023-10-29 DIAGNOSIS — F319 Bipolar disorder, unspecified: Secondary | ICD-10-CM | POA: Diagnosis not present

## 2023-10-29 NOTE — Progress Notes (Signed)
 V                                                                              Name: Erik Smith Date: 10/29/2023 MRN: [id number] DOB: 09/22/1965 PCP: Rilla Baller, MD     [name] participated from home, via video, and consented to treatment. Therapist participated from home office.   Guardian/Payee:  N/A    Paperwork requested: No   Reason for Visit /Presenting Problem: Improve/manage feeling of agitation  Mental Status Exam: Appearance:   Fairly Groomed     Behavior:  Appropriate  Motor:  Normal  Speech/Language:   Normal Rate  Affect:  Appropriate  Mood:  normal  Thought process:  normal  Thought content:    WNL  Sensory/Perceptual disturbances:    WNL  Orientation:  oriented to person, place, and situation  Attention:  Good  Concentration:  Good  Memory:  WNL  Fund of knowledge:   Good  Insight:    Good  Judgment:   Good  Impulse Control:  Good     Reported Symptoms:  Agitation, frustration, worry  Risk Assessment: Danger to Self:  No Self-injurious Behavior: No Danger to Others: No Duty to Warn:no Physical Aggression / Violence:No  Access to Firearms a concern: unknown Gang Involvement:No  Patient / guardian was educated about steps to take if suicide or homicide risk level increases between visits: n/a While future psychiatric events cannot be accurately predicted, the patient does not currently require acute inpatient psychiatric care and does not currently meet [place]  involuntary commitment criteria.  Substance Abuse History: Current substance abuse: No     Past Psychiatric History:   Previous psychological history is significant for Bipolar Mania Outpatient Providers:Dr. Slater Darner History of Psych Hospitalization: Yes  Psychological Testing: N/A   Abuse History:  Victim of: No., N/A   Report needed: No. Victim of  Neglect:No. Perpetrator of N/A  Witness / Exposure to Domestic Violence: No   Protective Services Involvement: No  Witness to [employer] Violence:  No   Family History:  Family History  Problem Relation Age of Onset   Sickle cell anemia Mother    Asthma Mother        smoker   Mental illness Mother    Coronary artery disease Father 89   Bladder Cancer Father    Post-traumatic stress disorder Brother    Lung cancer Paternal Grandmother 14       smoker   Prostate cancer Paternal Grandfather 91       deceased from prostate CA   Breast cancer Paternal Uncle    Stroke Neg Hx    Diabetes Neg Hx    Colon cancer Neg Hx    Colon polyps Neg Hx    Esophageal cancer Neg Hx    Rectal cancer Neg Hx    Stomach cancer Neg Hx     Living situation: the patient lives with their spouse  Sexual Orientation: Straight  Relationship Status: married  Name of spouse / other:[name] If a parent, number of children / ages:2 boys, 54 & 15  Support Systems: family  Financial Stress:  No   Income/Employment/Disability: Employment  Financial planner: unknown  Educational History: Education: post Engineer, maintenance (IT) work or Engineer, technical sales: unknown  Any cultural differences that may affect / interfere with treatment:  not applicable   Recreation/Hobbies: woodworking  Stressors: Occupational concerns    Strengths: Family  Barriers:  unknown   Legal History: Pending legal issue / charges: The patient has no significant history of legal issues. History of legal issue / charges: N/A  Medical History/Surgical History: not reviewed Past Medical History:  Diagnosis Date   Arthritis    Asthma    as a child   Bipolar disorder, unspecified (HCC)    hospitalization 2004 for depression, bipolar, ambien overdose   Cancer (HCC) 10/20/2015   skin ca on nose removed   Cholesteatoma 05/2010   s/p R ear surgery, residual tinnitus, planning on L ear surgery   Depression     bipolar   Family history of ischemic heart disease    History of asthma    Migraine without aura, without mention of intractable migraine without mention of status migrainosus    Rosacea 2017   Spinal stenosis    Unspecified adjustment reaction     Past Surgical History:  Procedure Laterality Date   CARPOMETACARPEL SUSPENSION PLASTY Left 10/18/2017   Procedure: LEFT THUMB TRAPEZIECTOMY AND SUSPENSIONPLASTY;  Surgeon: Murrell Drivers, MD;  Location: [facility];  Service: Orthopedics;  Laterality: Left;   CARPOMETACARPEL SUSPENSION PLASTY Right 03/28/2018   Procedure: CARPOMETACARPEL [facility]) SUSPENSION PLASTY AND TRAPEZIECTOMY;  Surgeon: Murrell Drivers, MD;  Location: [facility];  Service: Orthopedics;  Laterality: Right;   COLONOSCOPY  1999   normal (in [place] )   ESOPHAGUS SURGERY  2003   torn esophagus   INNER EAR SURGERY  1975, 1995, 2012, 2013   for cholesteatoma, bilateral first L then R   NECK SURGERY  2002   spinal stenosis and arthritis   perianal abscess  1998    Medications: Current Outpatient Medications  Medication Sig Dispense Refill   ARIPiprazole  (ABILIFY ) 2 MG tablet Take 1 tablet (2 mg total) by mouth at bedtime. 90 tablet 0   fluticasone  (FLONASE ) 50 MCG/ACT nasal spray Place 2 sprays into both nostrils daily. (Patient taking differently: Place 2 sprays into both nostrils daily. As needed) 16 g 0   ibuprofen  (ADVIL ) 200 MG tablet Take 3 tablets (600 mg total) by mouth 2 (two) times daily as needed for moderate pain.     lamoTRIgine  (LAMICTAL ) 100 MG tablet Take 1 tablet (100 mg total) by mouth 2 (two) times daily. 180 tablet 0   levocetirizine (XYZAL ) 5 MG tablet Take 1 tablet (5 mg total) by mouth every evening. (Patient taking differently: Take 5 mg by mouth every evening. As needed) 30 tablet 0   Liraglutide  -Weight Management (SAXENDA ) 18 MG/3ML SOPN Inject 0.6 mg into the skin daily for 7 days, THEN 1.2 mg daily for 7 days, THEN 1.8  mg daily. 24.54 mL 0   valACYclovir  (VALTREX ) 500 MG tablet TAKE 1 TABLET BY MOUTH TWICE DAILY 3 DAYS AT A TIME AS NEEDED 30 tablet 3   No current facility-administered medications for this visit.    Allergies  Allergen Reactions   Morphine And Codeine  Itching            Goals/Plan: Patient is seeking counseling for mood stabilization. Will utilize cognitive strategies to help manage feelings of anxiety and despair. Revised target date is 12-25 Monitor medication compliance -Ongoing.   Patient  agrees to a video Public affairs consultant) meeting and is aware of platform limitations. He is at home and provider is in home office.  Session Notes: Ottie is still working on the deck at his camper, but says it is almost done. His stepson and fiance bout a house in [place] and he is helping them replace flooring. He talked about his son injuring his knee during tennis. He is okay, but in a brace for 6 weeks. He has had a number of challenging experiences at home and work, but says he has not had any excessive emotional episodes and has kept moods even.                                                                                                                                       Diagnoses:  Bipolar Disorder  Plan of Care: Outpatient Therapy and psychotropic medication   CONI ALM KERNS, PhD Time:4:10p-5:00p 50 min.

## 2023-11-01 NOTE — Assessment & Plan Note (Signed)
 Preventative protocols reviewed and updated unless pt declined. Discussed healthy diet and lifestyle.

## 2023-11-01 NOTE — Progress Notes (Signed)
 Ph: [phone] Fax: [phone]   Patient ID: Erik Smith, male    DOB: March 08, 1966, 58 y.o.   MRN: [id number]  This visit was conducted in person.  BP 136/86   Pulse 73   Temp 98.4 F (36.9 C) (Oral)   Ht 5' 9.75 (1.772 m)   Wt 228 lb 6 oz (103.6 kg)   SpO2 99%   BMI 33.00 kg/m    CC: CPE Subjective:   HPI: Erik Smith is a 58 y.o. male presenting on 11/02/2023 for Annual Exam   Obesity - wegovy  was not covered by insurance. Zepbound  Rx 09/2023 - also not covered by insurance. Has tried Keto diet, weight watchers, has seen [place] and [facility] bariatric clinic.   Bipolar 1 - on abilify  2mg  at bedtime and recently lamictal  replaced lithiuim. Lithium  caused GI upset and diarrhea. Sees psychiatrist Dr Syed Arfeen. Also sees Dr Gutterman psychologist.    Fmhx early CAD (father MI 55s, heavy smoker).  Personal Lp(a) level <10 (10/2022)  Preventative: Colon cancer screening - colonoscopy 1999 for blood in stool, normal.  Prostate cancer screening - strong stream, no nocturia. + fmhx prostate cancer - paternal grandfather at older age. Check PSA yearly.  Lung cancer screening - not eligible  Flu yearly COVID vaccine - Pfizer vaccine 06/2019, 07/2019, booster 02/2020 Pneumococcal - discussed , defer for now Tdap 2016, 10/2023 Shingrix  - 10/2020, 10/2021 Seat belt use discussed  Sunscreen use discussed. No changing moles on skin. Sees derm yearly.  Sleep - averaging 6-8 hours/night  Smoking - non smoker Alcohol - 1-2 drinks/day (martini) Dentist yearly  Eye exam yearly  Bowel - no constipation. Occ blood with wiping   Lives with wife and son. Married 02/2021 Occ: principal at [employer] Activity: yardwork, no regular walking  Diet: Not a lot of fast food, good water, fruits/vegetables daily     Relevant past medical, surgical, family and social history reviewed and updated as indicated. Interim medical history since our last visit reviewed. Allergies and medications  reviewed and updated. Outpatient Medications Prior to Visit  Medication Sig Dispense Refill   ARIPiprazole  (ABILIFY ) 2 MG tablet Take 1 tablet (2 mg total) by mouth at bedtime. 90 tablet 0   fluticasone  (FLONASE ) 50 MCG/ACT nasal spray Place 2 sprays into both nostrils daily. (Patient taking differently: Place 2 sprays into both nostrils daily. As needed) 16 g 0   ibuprofen  (ADVIL ) 200 MG tablet Take 3 tablets (600 mg total) by mouth 2 (two) times daily as needed for moderate pain.     lamoTRIgine  (LAMICTAL ) 100 MG tablet Take 1 tablet (100 mg total) by mouth 2 (two) times daily. 180 tablet 0   levocetirizine (XYZAL ) 5 MG tablet Take 1 tablet (5 mg total) by mouth every evening. (Patient taking differently: Take 5 mg by mouth every evening. As needed) 30 tablet 0   valACYclovir  (VALTREX ) 500 MG tablet TAKE 1 TABLET BY MOUTH TWICE DAILY 3 DAYS AT A TIME AS NEEDED 30 tablet 3   Liraglutide  -Weight Management (SAXENDA ) 18 MG/3ML SOPN Inject 0.6 mg into the skin daily for 7 days, THEN 1.2 mg daily for 7 days, THEN 1.8 mg daily. 24.54 mL 0   No facility-administered medications prior to visit.     Per HPI unless specifically indicated in ROS section below Review of Systems  Constitutional:  Negative for activity change, appetite change, chills, fatigue, fever and unexpected weight change.  HENT:  Negative for hearing loss.   Eyes:  Negative for visual disturbance.  Respiratory:  Negative for cough, chest tightness, shortness of breath and wheezing.   Cardiovascular:  Negative for chest pain, palpitations and leg swelling.  Gastrointestinal:  Positive for blood in stool (occ attributed to hemorrhoids). Negative for abdominal distention, abdominal pain, constipation, diarrhea, nausea and vomiting.  Genitourinary:  Negative for difficulty urinating and hematuria.  Musculoskeletal:  Negative for arthralgias, myalgias and neck pain.  Skin:  Negative for rash.  Neurological:  Negative for dizziness,  seizures, syncope and headaches.  Hematological:  Negative for adenopathy. Does not bruise/bleed easily.  Psychiatric/Behavioral:  Negative for dysphoric mood. The patient is not nervous/anxious.     Objective:  BP 136/86   Pulse 73   Temp 98.4 F (36.9 C) (Oral)   Ht 5' 9.75 (1.772 m)   Wt 228 lb 6 oz (103.6 kg)   SpO2 99%   BMI 33.00 kg/m   Wt Readings from Last 3 Encounters:  11/02/23 228 lb 6 oz (103.6 kg)  09/11/23 230 lb (104.3 kg)  09/03/23 230 lb (104.3 kg)      Physical Exam Vitals and nursing note reviewed.  Constitutional:      General: He is not in acute distress.    Appearance: Normal appearance. He is well-developed. He is not ill-appearing.  HENT:     Head: Normocephalic and atraumatic.     Right Ear: Hearing, tympanic membrane, ear canal and external ear normal.     Left Ear: Hearing, tympanic membrane, ear canal and external ear normal.     Mouth/Throat:     Mouth: Mucous membranes are moist.     Pharynx: Oropharynx is clear. No oropharyngeal exudate or posterior oropharyngeal erythema.   Eyes:     General: No scleral icterus.    Extraocular Movements: Extraocular movements intact.     Conjunctiva/sclera: Conjunctivae normal.     Pupils: Pupils are equal, round, and reactive to light.   Neck:     Thyroid : No thyroid  mass or thyromegaly.   Cardiovascular:     Rate and Rhythm: Normal rate and regular rhythm.     Pulses: Normal pulses.          Radial pulses are 2+ on the right side and 2+ on the left side.     Heart sounds: Normal heart sounds. No murmur heard. Pulmonary:     Effort: Pulmonary effort is normal. No respiratory distress.     Breath sounds: Normal breath sounds. No wheezing, rhonchi or rales.  Abdominal:     General: Bowel sounds are normal. There is no distension.     Palpations: Abdomen is soft. There is no mass.     Tenderness: There is no abdominal tenderness. There is no guarding or rebound.     Hernia: No hernia is present.    Musculoskeletal:        General: Normal range of motion.     Cervical back: Normal range of motion and neck supple.     Right lower leg: No edema.     Left lower leg: No edema.  Lymphadenopathy:     Cervical: No cervical adenopathy.   Skin:    General: Skin is warm and dry.     Findings: No rash.   Neurological:     General: No focal deficit present.     Mental Status: He is alert and oriented to person, place, and time.   Psychiatric:        Mood and Affect: Mood normal.  Behavior: Behavior normal.        Thought Content: Thought content normal.        Judgment: Judgment normal.       Results for orders placed or performed in visit on 10/25/23  PSA   Collection Time: 10/25/23  7:38 AM  Result Value Ref Range   PSA 0.21 0.10 - 4.00 ng/mL  CBC with Differential/Platelet   Collection Time: 10/25/23  7:38 AM  Result Value Ref Range   WBC 5.6 4.0 - 10.5 K/uL   RBC 4.81 4.22 - 5.81 Mil/uL   Hemoglobin 14.2 13.0 - 17.0 g/dL   HCT 58.1 60.9 - 47.9 %   MCV 86.9 78.0 - 100.0 fl   MCHC 33.9 30.0 - 36.0 g/dL   RDW 86.2 88.4 - 84.4 %   Platelets 219.0 150.0 - 400.0 K/uL   Neutrophils Relative % 53.0 43.0 - 77.0 %   Lymphocytes Relative 31.8 12.0 - 46.0 %   Monocytes Relative 10.5 3.0 - 12.0 %   Eosinophils Relative 3.9 0.0 - 5.0 %   Basophils Relative 0.8 0.0 - 3.0 %   Neutro Abs 2.9 1.4 - 7.7 K/uL   Lymphs Abs 1.8 0.7 - 4.0 K/uL   Monocytes Absolute 0.6 0.1 - 1.0 K/uL   Eosinophils Absolute 0.2 0.0 - 0.7 K/uL   Basophils Absolute 0.0 0.0 - 0.1 K/uL  Comprehensive metabolic panel with GFR   Collection Time: 10/25/23  7:38 AM  Result Value Ref Range   Sodium 138 135 - 145 mEq/L   Potassium 4.5 3.5 - 5.1 mEq/L   Chloride 105 96 - 112 mEq/L   CO2 28 19 - 32 mEq/L   Glucose, Bld 85 70 - 99 mg/dL   BUN 15 6 - 23 mg/dL   Creatinine, Ser 9.14 0.40 - 1.50 mg/dL   Total Bilirubin 0.7 0.2 - 1.2 mg/dL   Alkaline Phosphatase 64 39 - 117 U/L   AST 18 0 - 37 U/L   ALT  19 0 - 53 U/L   Total Protein 6.5 6.0 - 8.3 g/dL   Albumin 4.3 3.5 - 5.2 g/dL   GFR 04.79 >39.99 mL/min   Calcium 9.0 8.4 - 10.5 mg/dL  Lipid panel   Collection Time: 10/25/23  7:38 AM  Result Value Ref Range   Cholesterol 201 (H) 0 - 200 mg/dL   Triglycerides 27.9 0.0 - 149.0 mg/dL   HDL 36.89 >60.99 mg/dL   VLDL 85.5 0.0 - 59.9 mg/dL   LDL Cholesterol 876 (H) 0 - 99 mg/dL   Total CHOL/HDL Ratio 3    NonHDL 137.44     Assessment & Plan:   Problem List Items Addressed This Visit     Healthcare maintenance - Primary (Chronic)   Preventative protocols reviewed and updated unless pt declined. Discussed healthy diet and lifestyle.       Bipolar 1 disorder (HCC)   Chronic followed by psychiatry on abilify  and lamictal       Obesity, Class I, BMI 30-34.9   Unfortunately insurance not covering weight loss medications.       Family history of premature CAD   Lp(a) <10 (10/2022) Order cardiac CT with CAC score       Relevant Orders   CT CARDIAC SCORING (SELF PAY ONLY)   Dyslipidemia   Chronic, mild off medication.  Fmhx prematura CAD (father) However personal Lp(a) <10 (10/2022) Will order cardiac CT with CAC score to further inform decision on statin cholesterol treatment.  The 10-year  ASCVD risk score (Arnett DK, et al., 2019) is: 7.4%   Values used to calculate the score:     Age: 54 years     Clincally relevant sex: Male     Is Non-Hispanic African American: No     Diabetic: No     Tobacco smoker: No     Systolic Blood Pressure: 136 mmHg     Is BP treated: No     HDL Cholesterol: 63.1 mg/dL     Total Cholesterol: 201 mg/dL       Relevant Orders   CT CARDIAC SCORING (SELF PAY ONLY)   Other Visit Diagnoses       Special screening for malignant neoplasms, colon       Relevant Orders   Ambulatory referral to Gastroenterology        No orders of the defined types were placed in this encounter.   Orders Placed This Encounter  Procedures   CT CARDIAC  SCORING (SELF PAY ONLY)    Standing Status:   Future    Expiration Date:   11/01/2024    Preferred imaging location?:   [facility]   Ambulatory referral to Gastroenterology    Referral Priority:   Routine    Referral Type:   Consultation    Referral Reason:   Specialty Services Required    Number of Visits Requested:   1    Patient Instructions  We will refer you to [facility] for colonoscopy. You may call [facility] to schedule an appointment at [phone].  I will order cardiac CT with calcium score to be done in [place].  Good to see you today  Return as needed or in 1 year for next physical.   Follow up plan: Return in about 1 year (around 11/01/2024), or if symptoms worsen or fail to improve.  Anton Blas, MD

## 2023-11-02 ENCOUNTER — Ambulatory Visit (INDEPENDENT_AMBULATORY_CARE_PROVIDER_SITE_OTHER): Payer: BC Managed Care – PPO | Admitting: Family Medicine

## 2023-11-02 ENCOUNTER — Encounter: Payer: Self-pay | Admitting: Family Medicine

## 2023-11-02 VITALS — BP 136/86 | HR 73 | Temp 98.4°F | Ht 69.75 in | Wt 228.4 lb

## 2023-11-02 DIAGNOSIS — E785 Hyperlipidemia, unspecified: Secondary | ICD-10-CM

## 2023-11-02 DIAGNOSIS — Z8249 Family history of ischemic heart disease and other diseases of the circulatory system: Secondary | ICD-10-CM

## 2023-11-02 DIAGNOSIS — Z0000 Encounter for general adult medical examination without abnormal findings: Secondary | ICD-10-CM

## 2023-11-02 DIAGNOSIS — Z1211 Encounter for screening for malignant neoplasm of colon: Secondary | ICD-10-CM

## 2023-11-02 DIAGNOSIS — F319 Bipolar disorder, unspecified: Secondary | ICD-10-CM

## 2023-11-02 DIAGNOSIS — E66811 Obesity, class 1: Secondary | ICD-10-CM

## 2023-11-02 NOTE — Patient Instructions (Addendum)
 We will refer you to [facility] for colonoscopy. You may call [facility] to schedule an appointment at [phone].  I will order cardiac CT with calcium score to be done in [place].  Good to see you today  Return as needed or in 1 year for next physical.

## 2023-11-02 NOTE — Assessment & Plan Note (Signed)
 Chronic followed by psychiatry on abilify  and lamictal 

## 2023-11-02 NOTE — Assessment & Plan Note (Signed)
 Unfortunately insurance not covering weight loss medications.

## 2023-11-02 NOTE — Assessment & Plan Note (Addendum)
 Lp(a) <10 (10/2022) Order cardiac CT with CAC score

## 2023-11-02 NOTE — Assessment & Plan Note (Addendum)
 Chronic, mild off medication.  Fmhx prematura CAD (father) However personal Lp(a) <10 (10/2022) Will order cardiac CT with CAC score to further inform decision on statin cholesterol treatment.  The 10-year ASCVD risk score (Arnett DK, et al., 2019) is: 7.4%   Values used to calculate the score:     Age: 58 years     Clincally relevant sex: Male     Is Non-Hispanic African American: No     Diabetic: No     Tobacco smoker: No     Systolic Blood Pressure: 136 mmHg     Is BP treated: No     HDL Cholesterol: 63.1 mg/dL     Total Cholesterol: 201 mg/dL

## 2023-11-05 NOTE — Progress Notes (Signed)
 V                                                                              Name: Erik Smith Date: 11/05/2023 MRN: [id number] DOB: [birth year] PCP: Rilla Baller, MD     Erik Smith participated from home, via video, and consented to treatment. Therapist participated from home office.   Guardian/Payee:  N/A    Paperwork requested: No   Reason for Visit /Presenting Problem: Improve/manage feeling of agitation  Mental Status Exam: Appearance:   Fairly Groomed     Behavior:  Appropriate  Motor:  Normal  Speech/Language:   Normal Rate  Affect:  Appropriate  Mood:  normal  Thought process:  normal  Thought content:    WNL  Sensory/Perceptual disturbances:    WNL  Orientation:  oriented to person, place, and situation  Attention:  Good  Concentration:  Good  Memory:  WNL  Fund of knowledge:   Good  Insight:    Good  Judgment:   Good  Impulse Control:  Good     Reported Symptoms:  Agitation, frustration, worry  Risk Assessment: Danger to Self:  No Self-injurious Behavior: No Danger to Others: No Duty to Warn:no Physical Aggression / Violence:No  Access to Firearms a concern: unknown Gang Involvement:No  Patient / guardian was educated about steps to take if suicide or homicide risk level increases between visits: n/a While future psychiatric events cannot be accurately predicted, the patient does not currently require acute inpatient psychiatric care and does not currently meet [place]  involuntary commitment criteria.  Substance Abuse History: Current substance abuse: No     Past Psychiatric History:   Previous psychological history is significant for Bipolar Mania Outpatient Providers:Dr. Slater Darner History of Psych Hospitalization: Yes  Psychological Testing: N/A   Abuse History:  Victim of: No., N/A   Report needed: No. Victim of  Neglect:No. Perpetrator of N/A  Witness / Exposure to Domestic Violence: No   Protective Services Involvement: No  Witness to [employer] Violence:  No   Family History:  Family History  Problem Relation Age of Onset   Sickle cell anemia Mother    Asthma Mother        smoker   Mental illness Mother    Coronary artery disease Father 16   Bladder Cancer Father    Post-traumatic stress disorder Brother    Lung cancer Paternal Grandmother 1       smoker   Prostate cancer Paternal Grandfather 61       deceased from prostate CA   Breast cancer Paternal Uncle    Stroke Neg Hx    Diabetes Neg Hx    Colon cancer Neg Hx    Colon polyps Neg Hx    Esophageal cancer Neg Hx    Rectal cancer Neg Hx    Stomach cancer Neg Hx     Living situation: the patient lives with their spouse  Sexual Orientation: Straight  Relationship Status: married  Name of spouse / other:[name] If a parent, number of children / ages:2 boys, 88 & 15  Support Systems: family  Financial Stress:  No   Income/Employment/Disability: Employment  Financial planner: unknown  Educational History: Education: post Engineer, maintenance (IT) work or Engineer, technical sales: unknown  Any cultural differences that may affect / interfere with treatment:  not applicable   Recreation/Hobbies: woodworking  Stressors: Occupational concerns    Strengths: Family  Barriers:  unknown   Legal History: Pending legal issue / charges: The patient has no significant history of legal issues. History of legal issue / charges: N/A  Medical History/Surgical History: not reviewed Past Medical History:  Diagnosis Date   Arthritis    Asthma    as a child   Bipolar disorder, unspecified (HCC)    hospitalization 2004 for depression, bipolar, ambien overdose   Cancer (HCC) 10/20/2015   skin ca on nose removed   Cholesteatoma 05/2010   s/p R ear surgery, residual tinnitus, planning on L ear surgery   Depression     bipolar   Family history of ischemic heart disease    History of asthma    Migraine without aura, without mention of intractable migraine without mention of status migrainosus    Rosacea 2017   Spinal stenosis    Unspecified adjustment reaction     Past Surgical History:  Procedure Laterality Date   CARPOMETACARPEL SUSPENSION PLASTY Left 10/18/2017   Procedure: LEFT THUMB TRAPEZIECTOMY AND SUSPENSIONPLASTY;  Surgeon: Murrell Drivers, MD;  Location: [facility];  Service: Orthopedics;  Laterality: Left;   CARPOMETACARPEL SUSPENSION PLASTY Right 03/28/2018   Procedure: CARPOMETACARPEL [facility]) SUSPENSION PLASTY AND TRAPEZIECTOMY;  Surgeon: Murrell Drivers, MD;  Location: [facility];  Service: Orthopedics;  Laterality: Right;   COLONOSCOPY  1999   normal (in [place] )   ESOPHAGUS SURGERY  2003   torn esophagus   INNER EAR SURGERY  1975, 1995, 2012, 2013   for cholesteatoma, bilateral first L then R   NECK SURGERY  2002   spinal stenosis and arthritis   perianal abscess  1998    Medications: Current Outpatient Medications  Medication Sig Dispense Refill   ARIPiprazole  (ABILIFY ) 2 MG tablet Take 1 tablet (2 mg total) by mouth at bedtime. 90 tablet 0   fluticasone  (FLONASE ) 50 MCG/ACT nasal spray Place 2 sprays into both nostrils daily. (Patient taking differently: Place 2 sprays into both nostrils daily. As needed) 16 g 0   ibuprofen  (ADVIL ) 200 MG tablet Take 3 tablets (600 mg total) by mouth 2 (two) times daily as needed for moderate pain.     lamoTRIgine  (LAMICTAL ) 100 MG tablet Take 1 tablet (100 mg total) by mouth 2 (two) times daily. 180 tablet 0   levocetirizine (XYZAL ) 5 MG tablet Take 1 tablet (5 mg total) by mouth every evening. (Patient taking differently: Take 5 mg by mouth every evening. As needed) 30 tablet 0   valACYclovir  (VALTREX ) 500 MG tablet TAKE 1 TABLET BY MOUTH TWICE DAILY 3 DAYS AT A TIME AS NEEDED 30 tablet 3   No current  facility-administered medications for this visit.    Allergies  Allergen Reactions   Morphine And Codeine  Itching            Goals/Plan: Patient is seeking counseling for mood stabilization. Will utilize cognitive strategies to help manage feelings of anxiety and despair. Revised target date is 12-25 Monitor medication compliance -Ongoing.   Patient agrees to a video Public affairs consultant) meeting and is aware of platform limitations. He is at home and provider is in home office.  Session Notes: Erik Smith reports that he is feeling good and  been busy working on his camper. He states that he has a very good prospect for a job, but is cautiously optimistic. It has He has had a series of expensive issues lately, causing some unanticipated stress, but is managing well. His son had an accident (fall) playing tennis and was injured. He is recovering, but they had to cancel a father son fishing trip. Sorren is doing well and says that relationship has been strong. Successful and maintaining a positive outlook and attitude.                                                                                                                                   Diagnoses:  Bipolar Disorder  Plan of Care: Outpatient Therapy and psychotropic medication   CONI ALM KERNS, PhD Time:4:10p-5:00p 50 min.

## 2023-11-12 ENCOUNTER — Ambulatory Visit (INDEPENDENT_AMBULATORY_CARE_PROVIDER_SITE_OTHER): Admitting: Psychology

## 2023-11-12 DIAGNOSIS — F319 Bipolar disorder, unspecified: Secondary | ICD-10-CM

## 2023-11-12 NOTE — Progress Notes (Signed)
 V                                                                              Name: Erik Smith Date: 11/12/2023 MRN: [id number] DOB: 1966-02-07 PCP: Rilla Baller, MD     [name] participated from home, via video, and consented to treatment. Therapist participated from home office.   Guardian/Payee:  N/A    Paperwork requested: No   Reason for Visit /Presenting Problem: Improve/manage feeling of agitation  Mental Status Exam: Appearance:   Fairly Groomed     Behavior:  Appropriate  Motor:  Normal  Speech/Language:   Normal Rate  Affect:  Appropriate  Mood:  normal  Thought process:  normal  Thought content:    WNL  Sensory/Perceptual disturbances:    WNL  Orientation:  oriented to person, place, and situation  Attention:  Good  Concentration:  Good  Memory:  WNL  Fund of knowledge:   Good  Insight:    Good  Judgment:   Good  Impulse Control:  Good     Reported Symptoms:  Agitation, frustration, worry  Risk Assessment: Danger to Self:  No Self-injurious Behavior: No Danger to Others: No Duty to Warn:no Physical Aggression / Violence:No  Access to Firearms a concern: unknown Gang Involvement:No  Patient / guardian was educated about steps to take if suicide or homicide risk level increases between visits: n/a While future psychiatric events cannot be accurately predicted, the patient does not currently require acute inpatient psychiatric care and does not currently meet [facility]  involuntary commitment criteria.  Substance Abuse History: Current substance abuse: No     Past Psychiatric History:   Previous psychological history is significant for Bipolar Mania Outpatient Providers:Dr. Slater Darner History of Psych Hospitalization: Yes  Psychological Testing: N/A   Abuse History:  Victim of: No., N/A   Report  needed: No. Victim of Neglect:No. Perpetrator of N/A  Witness / Exposure to Domestic Violence: No   Protective Services Involvement: No  Witness to [employer] Violence:  No   Family History:  Family History  Problem Relation Age of Onset   Sickle cell anemia Mother    Asthma Mother        smoker   Mental illness Mother    Coronary artery disease Father 16   Bladder Cancer Father    Post-traumatic stress disorder Brother    Lung cancer Paternal Grandmother 53       smoker   Prostate cancer Paternal Grandfather 45       deceased from prostate CA   Breast cancer Paternal Uncle    Stroke Neg Hx    Diabetes Neg Hx    Colon cancer Neg Hx    Colon polyps Neg Hx    Esophageal cancer Neg Hx    Rectal cancer Neg Hx    Stomach cancer Neg Hx     Living situation: the patient lives with their spouse  Sexual Orientation: Straight  Relationship Status: married  Name of spouse / other:[name] If a parent, number  of children / ages:2 boys, 27 & 15  Support Systems: family  Financial Stress:  No   Income/Employment/Disability: Employment  Financial planner: unknown  Educational History: Education: post Engineer, maintenance (IT) work or Engineer, technical sales: unknown  Any cultural differences that may affect / interfere with treatment:  not applicable   Recreation/Hobbies: woodworking  Stressors: Occupational concerns    Strengths: Family  Barriers:  unknown   Legal History: Pending legal issue / charges: The patient has no significant history of legal issues. History of legal issue / charges: N/A  Medical History/Surgical History: not reviewed Past Medical History:  Diagnosis Date   Arthritis    Asthma    as a child   Bipolar disorder, unspecified (HCC)    hospitalization 2004 for depression, bipolar, ambien overdose   Cancer (HCC) 10/20/2015   skin ca on nose removed   Cholesteatoma 05/2010   s/p R ear surgery, residual tinnitus, planning on L ear  surgery   Depression    bipolar   Family history of ischemic heart disease    History of asthma    Migraine without aura, without mention of intractable migraine without mention of status migrainosus    Rosacea 2017   Spinal stenosis    Unspecified adjustment reaction     Past Surgical History:  Procedure Laterality Date   CARPOMETACARPEL SUSPENSION PLASTY Left 10/18/2017   Procedure: LEFT THUMB TRAPEZIECTOMY AND SUSPENSIONPLASTY;  Surgeon: Murrell Drivers, MD;  Location: [facility];  Service: Orthopedics;  Laterality: Left;   CARPOMETACARPEL SUSPENSION PLASTY Right 03/28/2018   Procedure: CARPOMETACARPEL [facility]) SUSPENSION PLASTY AND TRAPEZIECTOMY;  Surgeon: Murrell Drivers, MD;  Location: [facility];  Service: Orthopedics;  Laterality: Right;   COLONOSCOPY  1999   normal (in [place] )   ESOPHAGUS SURGERY  2003   torn esophagus   INNER EAR SURGERY  1975, 1995, 2012, 2013   for cholesteatoma, bilateral first L then R   NECK SURGERY  2002   spinal stenosis and arthritis   perianal abscess  1998    Medications: Current Outpatient Medications  Medication Sig Dispense Refill   ARIPiprazole  (ABILIFY ) 2 MG tablet Take 1 tablet (2 mg total) by mouth at bedtime. 90 tablet 0   fluticasone  (FLONASE ) 50 MCG/ACT nasal spray Place 2 sprays into both nostrils daily. (Patient taking differently: Place 2 sprays into both nostrils daily. As needed) 16 g 0   ibuprofen  (ADVIL ) 200 MG tablet Take 3 tablets (600 mg total) by mouth 2 (two) times daily as needed for moderate pain.     lamoTRIgine  (LAMICTAL ) 100 MG tablet Take 1 tablet (100 mg total) by mouth 2 (two) times daily. 180 tablet 0   levocetirizine (XYZAL ) 5 MG tablet Take 1 tablet (5 mg total) by mouth every evening. (Patient taking differently: Take 5 mg by mouth every evening. As needed) 30 tablet 0   valACYclovir  (VALTREX ) 500 MG tablet TAKE 1 TABLET BY MOUTH TWICE DAILY 3 DAYS AT A TIME AS NEEDED 30 tablet 3    No current facility-administered medications for this visit.    Allergies  Allergen Reactions   Morphine And Codeine  Itching            Goals/Plan: Patient is seeking counseling for mood stabilization. Will utilize cognitive strategies to help manage feelings of anxiety and despair. Revised target date is 12-25 Monitor medication compliance -Ongoing.   Patient agrees to a video Public affairs consultant) meeting and is aware of platform limitations. He is at home and  provider is in home office.  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 Was asked to interview for another position in another county, and he left the beach to attend. Will hope to hear in next couple of weeks. Father has 80th birthday next week in [place]  [place] Will take son and wife with him to the celebration. Brother will be there as well. Father and brother have not spoken in at least 1 1/2 years. He is optimistic it will go well. Reports stable moods.                                                                                                                                         Diagnoses:  Bipolar Disorder  Plan of Care: Outpatient Therapy and psychotropic medication   CONI ALM KERNS, PhD Time:4:15p-5:00p 45 min.

## 2023-11-22 ENCOUNTER — Encounter: Payer: Self-pay | Admitting: Internal Medicine

## 2023-11-26 ENCOUNTER — Ambulatory Visit (INDEPENDENT_AMBULATORY_CARE_PROVIDER_SITE_OTHER): Admitting: Psychology

## 2023-11-26 DIAGNOSIS — F319 Bipolar disorder, unspecified: Secondary | ICD-10-CM | POA: Diagnosis not present

## 2023-11-26 NOTE — Progress Notes (Signed)
 V                                                                              Name: Erik Smith Date: 11/26/2023 MRN: [id number] DOB: 23-Nov-1965 PCP: Rilla Baller, MD     Erik Smith participated from home, via video, and consented to treatment. Therapist participated from home office.   Guardian/Payee:  N/A    Paperwork requested: No   Reason for Visit /Presenting Problem: Improve/manage feeling of agitation  Mental Status Exam: Appearance:   Fairly Groomed     Behavior:  Appropriate  Motor:  Normal  Speech/Language:   Normal Rate  Affect:  Appropriate  Mood:  normal  Thought process:  normal  Thought content:    WNL  Sensory/Perceptual disturbances:    WNL  Orientation:  oriented to person, place, and situation  Attention:  Good  Concentration:  Good  Memory:  WNL  Fund of knowledge:   Good  Insight:    Good  Judgment:   Good  Impulse Control:  Good     Reported Symptoms:  Agitation, frustration, worry  Risk Assessment: Danger to Self:  No Self-injurious Behavior: No Danger to Others: No Duty to Warn:no Physical Aggression / Violence:No  Access to Firearms a concern: unknown Gang Involvement:No  Patient / guardian was educated about steps to take if suicide or homicide risk level increases between visits: n/a While future psychiatric events cannot be accurately predicted, the patient does not currently require acute inpatient psychiatric care and does not currently meet [facility]  involuntary commitment criteria.  Substance Abuse History: Current substance abuse: No     Past Psychiatric History:   Previous psychological history is significant for Bipolar Mania Outpatient Providers:Dr. Slater Darner History of Psych Hospitalization: Yes  Psychological Testing: N/A   Abuse History:   Victim of: No., N/A   Report needed: No. Victim of Neglect:No. Perpetrator of N/A  Witness / Exposure to Domestic Violence: No   Protective Services Involvement: No  Witness to [employer] Violence:  No   Family History:  Family History  Problem Relation Age of Onset   Sickle cell anemia Mother    Asthma Mother        smoker   Mental illness Mother    Coronary artery disease Father 21   Bladder Cancer Father    Post-traumatic stress disorder Brother    Lung cancer Paternal Grandmother 69       smoker   Prostate cancer Paternal Grandfather 40       deceased from prostate CA   Breast cancer Paternal Uncle    Stroke Neg Hx    Diabetes Neg Hx    Colon cancer Neg Hx    Colon polyps Neg Hx    Esophageal cancer Neg Hx    Rectal cancer Neg Hx    Stomach cancer Neg Hx     Living situation: the patient lives with their spouse  Sexual Orientation:  Straight  Relationship Status: married  Name of spouse / other:[name] If a parent, number of children / ages:2 boys, 4 & 15  Support Systems: family  Financial Stress:  No   Income/Employment/Disability: Employment  Financial planner: unknown  Educational History: Education: post Engineer, maintenance (IT) work or degree  Religion/Sprituality/World View: unknown  Any cultural differences that may affect / interfere with treatment:  not applicable   Recreation/Hobbies: woodworking  Stressors: Occupational concerns    Strengths: Family  Barriers:  unknown   Legal History: Pending legal issue / charges: The patient has no significant history of legal issues. History of legal issue / charges: N/A  Medical History/Surgical History: not reviewed Past Medical History:  Diagnosis Date   Arthritis    Asthma    as a child   Bipolar disorder, unspecified (HCC)    hospitalization 2004 for depression, bipolar, ambien overdose   Cancer (HCC) 10/20/2015   skin ca on nose removed   Cholesteatoma 05/2010   s/p R ear surgery, residual  tinnitus, planning on L ear surgery   Depression    bipolar   Family history of ischemic heart disease    History of asthma    Migraine without aura, without mention of intractable migraine without mention of status migrainosus    Rosacea 2017   Spinal stenosis    Unspecified adjustment reaction     Past Surgical History:  Procedure Laterality Date   CARPOMETACARPEL SUSPENSION PLASTY Left 10/18/2017   Procedure: LEFT THUMB TRAPEZIECTOMY AND SUSPENSIONPLASTY;  Surgeon: Murrell Drivers, MD;  Location: [facility];  Service: Orthopedics;  Laterality: Left;   CARPOMETACARPEL SUSPENSION PLASTY Right 03/28/2018   Procedure: CARPOMETACARPEL [facility]) SUSPENSION PLASTY AND TRAPEZIECTOMY;  Surgeon: Murrell Drivers, MD;  Location: [facility];  Service: Orthopedics;  Laterality: Right;   COLONOSCOPY  1999   normal (in [place] )   ESOPHAGUS SURGERY  2003   torn esophagus   INNER EAR SURGERY  1975, 1995, 2012, 2013   for cholesteatoma, bilateral first L then R   NECK SURGERY  2002   spinal stenosis and arthritis   perianal abscess  1998    Medications: Current Outpatient Medications  Medication Sig Dispense Refill   ARIPiprazole  (ABILIFY ) 2 MG tablet Take 1 tablet (2 mg total) by mouth at bedtime. 90 tablet 0   fluticasone  (FLONASE ) 50 MCG/ACT nasal spray Place 2 sprays into both nostrils daily. (Patient taking differently: Place 2 sprays into both nostrils daily. As needed) 16 g 0   ibuprofen  (ADVIL ) 200 MG tablet Take 3 tablets (600 mg total) by mouth 2 (two) times daily as needed for moderate pain.     lamoTRIgine  (LAMICTAL ) 100 MG tablet Take 1 tablet (100 mg total) by mouth 2 (two) times daily. 180 tablet 0   levocetirizine (XYZAL ) 5 MG tablet Take 1 tablet (5 mg total) by mouth every evening. (Patient taking differently: Take 5 mg by mouth every evening. As needed) 30 tablet 0   valACYclovir  (VALTREX ) 500 MG tablet TAKE 1 TABLET BY MOUTH TWICE DAILY 3 DAYS AT A TIME  AS NEEDED 30 tablet 3   No current facility-administered medications for this visit.    Allergies  Allergen Reactions   Morphine And Codeine  Itching            Goals/Plan: Patient is seeking counseling for mood stabilization. Will utilize cognitive strategies to help manage feelings of anxiety and despair. Revised target date is 12-25 Monitor medication compliance -Ongoing.   Patient agrees to  a video Public affairs consultant) meeting and is aware of platform limitations. He is at home and provider is in home office.  Session Notes: Recardo just returned from beach to pick up his tools. Says he has been dealing with a lot of crap. His brother has blocked him from his mother's phone. He is her power of attorney and has contacted legal counsel to see what he can do. Still no job offers, but he is trying to remain optimistic. Staying very busy with various house projects. Relationship is strong and he reports that things at home are stable. Is gearing up to prepare for the next school year with the hopes he will have an offer to consider.                                                                                                                                            Diagnoses:  Bipolar Disorder  Plan of Care: Outpatient Therapy and psychotropic medication   CONI ALM KERNS, PhD Time:4:15p-5:00p 45 min.

## 2023-11-28 ENCOUNTER — Ambulatory Visit (HOSPITAL_BASED_OUTPATIENT_CLINIC_OR_DEPARTMENT_OTHER)
Admission: RE | Admit: 2023-11-28 | Discharge: 2023-11-28 | Disposition: A | Discharge status: Home / Self Care | Payer: Self-pay | Source: Ambulatory Visit | Attending: Family Medicine | Admitting: Family Medicine

## 2023-11-28 DIAGNOSIS — Z8249 Family history of ischemic heart disease and other diseases of the circulatory system: Secondary | ICD-10-CM | POA: Insufficient documentation

## 2023-11-28 DIAGNOSIS — E785 Hyperlipidemia, unspecified: Secondary | ICD-10-CM | POA: Insufficient documentation

## 2023-11-29 ENCOUNTER — Ambulatory Visit: Payer: Self-pay | Admitting: Family Medicine

## 2023-11-29 DIAGNOSIS — E785 Hyperlipidemia, unspecified: Secondary | ICD-10-CM

## 2023-11-30 MED ORDER — ATORVASTATIN CALCIUM 20 MG PO TABS: 20.0000 mg | ORAL_TABLET | Freq: Every day | ORAL | 3 refills | Status: AC

## 2023-12-03 ENCOUNTER — Encounter (HOSPITAL_COMMUNITY): Payer: Self-pay | Admitting: Psychiatry

## 2023-12-03 ENCOUNTER — Telehealth (HOSPITAL_BASED_OUTPATIENT_CLINIC_OR_DEPARTMENT_OTHER): Admitting: Psychiatry

## 2023-12-03 DIAGNOSIS — F319 Bipolar disorder, unspecified: Secondary | ICD-10-CM | POA: Diagnosis not present

## 2023-12-03 DIAGNOSIS — F410 Panic disorder [episodic paroxysmal anxiety] without agoraphobia: Secondary | ICD-10-CM

## 2023-12-03 MED ORDER — LAMOTRIGINE 150 MG PO TABS: ORAL_TABLET | ORAL | 0 refills | Status: DC

## 2023-12-03 MED ORDER — ARIPIPRAZOLE 2 MG PO TABS
2.0000 mg | ORAL_TABLET | Freq: Every day | ORAL | 0 refills | Status: DC
Start: 2023-12-03 — End: 2024-02-06

## 2023-12-03 NOTE — Progress Notes (Signed)
 [facility] MD Virtual Progress Note   Patient Location: Home Provider Location: Office  I connect with patient by video and verified that I am speaking with correct person by using two identifiers. I discussed the limitations of evaluation and management by telemedicine and the availability of in person appointments. I also discussed with the patient that there may be a patient responsible charge related to this service. The patient expressed understanding and agreed to proceed.  Erik Smith [id number] 58 y.o.  12/03/2023 3:32 PM  History of Present Illness:  Patient is evaluated by video session.  He reported things are going okay but sometimes he gets frustrated and irritable without any trigger.  He noted some time when his wife cut him off he got upset.  Patient told last month he enjoyed the wedding of his stepson in [place] and next month he is doing of a small family reunion with his daughter and his son and other family members.  He is taking Lamictal  50 mg 2 times a day.  He has no rash, itching tremors or shakes.  He is stressed because he need to clean the backyard which he has not done in a while.  He sleeps fair.  He denies any mania psychosis, hallucination or any highs and lows.  He lives with his wife who is a Risk analyst.  His energy level is fair.  He does not feel getting impulsive or agitated but gets some time irritable.  He is no longer taking Zepbound  because insurance did not approve but he was able to lose weight which she like to keep it stable.  He is seeing Dr. Gutterman for therapy.  Denies any hallucination, suicidal thoughts.  He denies any panic attack.  Past Psychiatric History: History of inpatient in 2004.  History of overdose, mania, depression, panic attacks.  History of impulsive eating and excessive buying and taking multiple decisions.  Tried Lexapro, Celexa , lithium , risperidone .  Saw Dr. Rojelio Molt who retired.   Past Medical  History:  Diagnosis Date   Arthritis    Asthma    as a child   Bipolar disorder, unspecified (HCC)    hospitalization 2004 for depression, bipolar, ambien overdose   Cancer (HCC) 10/20/2015   skin ca on nose removed   Cholesteatoma 05/2010   s/p R ear surgery, residual tinnitus, planning on L ear surgery   Depression    bipolar   Family history of ischemic heart disease    History of asthma    Migraine without aura, without mention of intractable migraine without mention of status migrainosus    Rosacea 2017   Spinal stenosis    Unspecified adjustment reaction     Outpatient Encounter Medications as of 12/03/2023  Medication Sig   ARIPiprazole  (ABILIFY ) 2 MG tablet Take 1 tablet (2 mg total) by mouth at bedtime.   atorvastatin  (LIPITOR) 20 MG tablet Take 1 tablet (20 mg total) by mouth daily.   fluticasone  (FLONASE ) 50 MCG/ACT nasal spray Place 2 sprays into both nostrils daily. (Patient taking differently: Place 2 sprays into both nostrils daily. As needed)   ibuprofen  (ADVIL ) 200 MG tablet Take 3 tablets (600 mg total) by mouth 2 (two) times daily as needed for moderate pain.   lamoTRIgine  (LAMICTAL ) 100 MG tablet Take 1 tablet (100 mg total) by mouth 2 (two) times daily.   levocetirizine (XYZAL ) 5 MG tablet Take 1 tablet (5 mg total) by mouth every evening. (Patient taking differently: Take 5 mg by mouth  every evening. As needed)   valACYclovir  (VALTREX ) 500 MG tablet TAKE 1 TABLET BY MOUTH TWICE DAILY 3 DAYS AT A TIME AS NEEDED   No facility-administered encounter medications on file as of 12/03/2023.    Recent Results (from the past 2160 hours)  PSA     Status: None   Collection Time: 10/25/23  7:38 AM  Result Value Ref Range   PSA 0.21 0.10 - 4.00 ng/mL    Comment: Test performed using Access Hybritech PSA Assay, a parmagnetic partical, chemiluminecent immunoassay.  CBC with Differential/Platelet     Status: None   Collection Time: 10/25/23  7:38 AM  Result Value Ref  Range   WBC 5.6 4.0 - 10.5 K/uL   RBC 4.81 4.22 - 5.81 Mil/uL   Hemoglobin 14.2 13.0 - 17.0 g/dL   HCT 58.1 60.9 - 47.9 %   MCV 86.9 78.0 - 100.0 fl   MCHC 33.9 30.0 - 36.0 g/dL   RDW 86.2 88.4 - 84.4 %   Platelets 219.0 150.0 - 400.0 K/uL   Neutrophils Relative % 53.0 43.0 - 77.0 %   Lymphocytes Relative 31.8 12.0 - 46.0 %   Monocytes Relative 10.5 3.0 - 12.0 %   Eosinophils Relative 3.9 0.0 - 5.0 %   Basophils Relative 0.8 0.0 - 3.0 %   Neutro Abs 2.9 1.4 - 7.7 K/uL   Lymphs Abs 1.8 0.7 - 4.0 K/uL   Monocytes Absolute 0.6 0.1 - 1.0 K/uL   Eosinophils Absolute 0.2 0.0 - 0.7 K/uL   Basophils Absolute 0.0 0.0 - 0.1 K/uL  Comprehensive metabolic panel with GFR     Status: None   Collection Time: 10/25/23  7:38 AM  Result Value Ref Range   Sodium 138 135 - 145 mEq/L   Potassium 4.5 3.5 - 5.1 mEq/L   Chloride 105 96 - 112 mEq/L   CO2 28 19 - 32 mEq/L   Glucose, Bld 85 70 - 99 mg/dL   BUN 15 6 - 23 mg/dL   Creatinine, Ser 9.14 0.40 - 1.50 mg/dL   Total Bilirubin 0.7 0.2 - 1.2 mg/dL   Alkaline Phosphatase 64 39 - 117 U/L   AST 18 0 - 37 U/L   ALT 19 0 - 53 U/L   Total Protein 6.5 6.0 - 8.3 g/dL   Albumin 4.3 3.5 - 5.2 g/dL   GFR 04.79 >39.99 mL/min    Comment: Calculated using the CKD-EPI Creatinine Equation (2021)   Calcium  9.0 8.4 - 10.5 mg/dL  Lipid panel     Status: Abnormal   Collection Time: 10/25/23  7:38 AM  Result Value Ref Range   Cholesterol 201 (H) 0 - 200 mg/dL    Comment: ATP III Classification       Desirable:  < 200 mg/dL               Borderline High:  200 - 239 mg/dL          High:  > = 759 mg/dL   Triglycerides 27.9 0.0 - 149.0 mg/dL    Comment: Normal:  <849 mg/dLBorderline High:  150 - 199 mg/dL   HDL 36.89 >60.99 mg/dL   VLDL 85.5 0.0 - 59.9 mg/dL   LDL Cholesterol 876 (H) 0 - 99 mg/dL   Total CHOL/HDL Ratio 3     Comment:                Men          Women1/2 Average Risk  3.4          3.3Average Risk          5.0          4.42X Average Risk           9.6          7.13X Average Risk          15.0          11.0                       NonHDL 137.44     Comment: NOTE:  Non-HDL goal should be 30 mg/dL higher than patient's LDL goal (i.e. LDL goal of < 70 mg/dL, would have non-HDL goal of < 100 mg/dL)     Psychiatric Specialty Exam: Physical Exam  Review of Systems  Weight 228 lb (103.4 kg).There is no height or weight on file to calculate BMI.  General Appearance: Casual  Eye Contact:  Good  Speech:  Clear and Coherent  Volume:  Normal  Mood:  Euthymic  Affect:  Appropriate  Thought Process:  Descriptions of Associations: Intact  Orientation:  Full (Time, Place, and Person)  Thought Content:  Logical  Suicidal Thoughts:  No  Homicidal Thoughts:  No  Memory:  Immediate;   Good Recent;   Good Remote;   Good  Judgement:  Good  Insight:  Present  Psychomotor Activity:  Normal  Concentration:  Concentration: Good and Attention Span: Good  Recall:  Good  Fund of Knowledge:  Good  Language:  Good  Akathisia:  No  Handed:  Right  AIMS (if indicated):     Assets:  Communication Skills Desire for Improvement Housing Resilience Social Support Transportation  ADL's:  Intact  Cognition:  WNL  Sleep: Okay       11/02/2023    9:11 AM 10/30/2022    8:06 AM 10/28/2021    9:01 AM 10/22/2020    9:18 AM 10/21/2019   11:46 AM  Depression screen PHQ 2/9  Decreased Interest 0 0 1 0 0  Down, Depressed, Hopeless 0 1 1 1  0  PHQ - 2 Score 0 1 2 1  0  Altered sleeping 0 0 0 0   Tired, decreased energy 0 0 1 0   Change in appetite 1 2 3  0   Feeling bad or failure about yourself  0 1 2 1    Trouble concentrating 0 0 1 0   Moving slowly or fidgety/restless 0 0 0 0   Suicidal thoughts 0 0 0 0   PHQ-9 Score 1 4 9 2    Difficult doing work/chores Not difficult at all Not difficult at all Somewhat difficult      Assessment/Plan: Bipolar 1 disorder (HCC) - Plan: ARIPiprazole  (ABILIFY ) 2 MG tablet, lamoTRIgine  (LAMICTAL ) 150 MG tablet  Panic  attacks - Plan: lamoTRIgine  (LAMICTAL ) 150 MG tablet  Discussed episodic irritability and frustration.  Recommend to trial Lamictal  75 mg twice a day.  In the past he had tried 100 mg 2 times a day but make him dizzy.  He is agreed to give a try total 150 in divided doses.  Will continue Abilify  2 mg daily.  Encourage walking, exercise.  Encouraged to continue therapy with Mr. Gutterman.  Will follow-up in 3 months.  Recommend to call back if is any question or any concern.   Follow Up Instructions:     I discussed the assessment and treatment plan with the patient.  The patient was provided an opportunity to ask questions and all were answered. The patient agreed with the plan and demonstrated an understanding of the instructions.   The patient was advised to call back or seek an in-person evaluation if the symptoms worsen or if the condition fails to improve as anticipated.    Collaboration of Care: Other provider involved in patient's care AEB notes are available in epic to review  Patient/Guardian was advised Release of Information must be obtained prior to any record release in order to collaborate their care with an outside provider. Patient/Guardian was advised if they have not already done so to contact the registration department to sign all necessary forms in order for us  to release information regarding their care.   Consent: Patient/Guardian gives verbal consent for treatment and assignment of benefits for services provided during this visit. Patient/Guardian expressed understanding and agreed to proceed.     Total encounter time 26 minutes which includes face-to-face time, chart reviewed, care coordination, order entry and documentation during this encounter.   Note: This document was prepared by [employer] voice dictation technology and any errors that results from this process are unintentional.    Leni ONEIDA Client, MD 12/03/2023

## 2023-12-10 ENCOUNTER — Ambulatory Visit (INDEPENDENT_AMBULATORY_CARE_PROVIDER_SITE_OTHER): Admitting: Psychology

## 2023-12-10 DIAGNOSIS — F319 Bipolar disorder, unspecified: Secondary | ICD-10-CM | POA: Diagnosis not present

## 2023-12-10 NOTE — Progress Notes (Signed)
 V                                                                              Name: Erik Smith Date: 12/10/2023 MRN: [id number] DOB: 15-Nov-1965 PCP: Rilla Baller, MD     [name] participated from home, via video, and consented to treatment. Therapist participated from home office.   Guardian/Payee:  N/A    Paperwork requested: No   Reason for Visit /Presenting Problem: Improve/manage feeling of agitation  Mental Status Exam: Appearance:   Fairly Groomed     Behavior:  Appropriate  Motor:  Normal  Speech/Language:   Normal Rate  Affect:  Appropriate  Mood:  normal  Thought process:  normal  Thought content:    WNL  Sensory/Perceptual disturbances:    WNL  Orientation:  oriented to person, place, and situation  Attention:  Good  Concentration:  Good  Memory:  WNL  Fund of knowledge:   Good  Insight:    Good  Judgment:   Good  Impulse Control:  Good     Reported Symptoms:  Agitation, frustration, worry  Risk Assessment: Danger to Self:  No Self-injurious Behavior: No Danger to Others: No Duty to Warn:no Physical Aggression / Violence:No  Access to Firearms a concern: unknown Gang Involvement:No  Patient / guardian was educated about steps to take if suicide or homicide risk level increases between visits: n/a While future psychiatric events cannot be accurately predicted, the patient does not currently require acute inpatient psychiatric care and does not currently meet [place]  involuntary commitment criteria.  Substance Abuse History: Current substance abuse: No     Past Psychiatric History:   Previous psychological history is significant for Bipolar Mania Outpatient Providers:Dr. Slater Darner History of Psych Hospitalization: Yes  Psychological Testing:  N/A   Abuse History:  Victim of: No., N/A   Report needed: No. Victim of Neglect:No. Perpetrator of N/A  Witness / Exposure to Domestic Violence: No   Protective Services Involvement: No  Witness to [employer] Violence:  No   Family History:  Family History  Problem Relation Age of Onset   Sickle cell anemia Mother    Asthma Mother        smoker   Mental illness Mother    Coronary artery disease Father 16   Bladder Cancer Father    Post-traumatic stress disorder Brother    Lung cancer Paternal Grandmother 34       smoker   Prostate cancer Paternal Grandfather 68       deceased from prostate CA   Breast cancer Paternal Uncle    Stroke Neg Hx    Diabetes Neg Hx    Colon cancer Neg Hx    Colon polyps Neg Hx    Esophageal cancer Neg Hx    Rectal cancer Neg Hx    Stomach cancer Neg Hx  Living situation: the patient lives with their spouse  Sexual Orientation: Straight  Relationship Status: married  Name of spouse / other:[name] If a parent, number of children / ages:2 boys, 24 & 15  Support Systems: family  Financial Stress:  No   Income/Employment/Disability: Employment  Financial planner: unknown  Educational History: Education: post Engineer, maintenance (IT) work or degree  Religion/Sprituality/World View: unknown  Any cultural differences that may affect / interfere with treatment:  not applicable   Recreation/Hobbies: woodworking  Stressors: Occupational concerns    Strengths: Family  Barriers:  unknown   Legal History: Pending legal issue / charges: The patient has no significant history of legal issues. History of legal issue / charges: N/A  Medical History/Surgical History: not reviewed Past Medical History:  Diagnosis Date   Arthritis    Asthma    as a child   Bipolar disorder, unspecified (HCC)    hospitalization 2004 for depression, bipolar, ambien overdose   Cancer (HCC) 10/20/2015   skin ca on nose removed   Cholesteatoma 05/2010   s/p  R ear surgery, residual tinnitus, planning on L ear surgery   Depression    bipolar   Family history of ischemic heart disease    History of asthma    Migraine without aura, without mention of intractable migraine without mention of status migrainosus    Rosacea 2017   Spinal stenosis    Unspecified adjustment reaction     Past Surgical History:  Procedure Laterality Date   CARPOMETACARPEL SUSPENSION PLASTY Left 10/18/2017   Procedure: LEFT THUMB TRAPEZIECTOMY AND SUSPENSIONPLASTY;  Surgeon: Murrell Drivers, MD;  Location: [facility];  Service: Orthopedics;  Laterality: Left;   CARPOMETACARPEL SUSPENSION PLASTY Right 03/28/2018   Procedure: CARPOMETACARPEL [facility]) SUSPENSION PLASTY AND TRAPEZIECTOMY;  Surgeon: Murrell Drivers, MD;  Location: [facility];  Service: Orthopedics;  Laterality: Right;   COLONOSCOPY  1999   normal (in [place] )   ESOPHAGUS SURGERY  2003   torn esophagus   INNER EAR SURGERY  1975, 1995, 2012, 2013   for cholesteatoma, bilateral first L then R   NECK SURGERY  2002   spinal stenosis and arthritis   perianal abscess  1998    Medications: Current Outpatient Medications  Medication Sig Dispense Refill   ARIPiprazole  (ABILIFY ) 2 MG tablet Take 1 tablet (2 mg total) by mouth at bedtime. 90 tablet 0   atorvastatin  (LIPITOR) 20 MG tablet Take 1 tablet (20 mg total) by mouth daily. 90 tablet 3   fluticasone  (FLONASE ) 50 MCG/ACT nasal spray Place 2 sprays into both nostrils daily. (Patient taking differently: Place 2 sprays into both nostrils daily. As needed) 16 g 0   ibuprofen  (ADVIL ) 200 MG tablet Take 3 tablets (600 mg total) by mouth 2 (two) times daily as needed for moderate pain.     lamoTRIgine  (LAMICTAL ) 150 MG tablet Take 1/2 tab twice daily 90 tablet 0   levocetirizine (XYZAL ) 5 MG tablet Take 1 tablet (5 mg total) by mouth every evening. (Patient taking differently: Take 5 mg by mouth every evening. As needed) 30 tablet 0    valACYclovir  (VALTREX ) 500 MG tablet TAKE 1 TABLET BY MOUTH TWICE DAILY 3 DAYS AT A TIME AS NEEDED 30 tablet 3   No current facility-administered medications for this visit.    Allergies  Allergen Reactions   Morphine And Codeine  Itching            Goals/Plan: Patient is seeking counseling for mood stabilization. Will utilize cognitive  strategies to help manage feelings of anxiety and despair. Revised target date is 12-25 Monitor medication compliance -Ongoing.   Patient agrees to a video Public affairs consultant) meeting and is aware of platform limitations. He is at home and provider is in home office.  Session Notes: Brax was at [employer] all weekend and now home preparing for a big family gathering. His psychiatrist increased his Limictal to 150mg  from 100mg . Brandy isn't sure why, but he will comply. Was not explained to him. Discussed his work situation, which has stalled. Wife has poor job satisfaction and he feels powerless to help. Strategies to improve overall satisfaction both individually and within family.                                                                                                                                              Diagnoses:  Bipolar Disorder  Plan of Care: Outpatient Therapy and psychotropic medication   CONI ALM KERNS, PhD Time:4:15p-5:00p 45 min.

## 2023-12-13 ENCOUNTER — Encounter: Payer: Self-pay | Admitting: Internal Medicine

## 2023-12-13 ENCOUNTER — Ambulatory Visit (AMBULATORY_SURGERY_CENTER)

## 2023-12-13 VITALS — Ht 70.0 in | Wt 225.0 lb

## 2023-12-13 DIAGNOSIS — Z1211 Encounter for screening for malignant neoplasm of colon: Secondary | ICD-10-CM

## 2023-12-13 MED ORDER — NA SULFATE-K SULFATE-MG SULF 17.5-3.13-1.6 GM/177ML PO SOLN: 1.0000 | Freq: Once | ORAL | 0 refills | Status: AC

## 2023-12-13 NOTE — Progress Notes (Signed)

## 2023-12-20 ENCOUNTER — Encounter: Payer: Self-pay | Admitting: Internal Medicine

## 2023-12-20 ENCOUNTER — Ambulatory Visit (AMBULATORY_SURGERY_CENTER): Admitting: Internal Medicine

## 2023-12-20 VITALS — BP 131/78 | HR 65 | Temp 97.9°F | Resp 14 | Ht 70.0 in | Wt 225.0 lb

## 2023-12-20 DIAGNOSIS — K5730 Diverticulosis of large intestine without perforation or abscess without bleeding: Secondary | ICD-10-CM | POA: Diagnosis not present

## 2023-12-20 DIAGNOSIS — Z1211 Encounter for screening for malignant neoplasm of colon: Secondary | ICD-10-CM

## 2023-12-20 DIAGNOSIS — K648 Other hemorrhoids: Secondary | ICD-10-CM | POA: Diagnosis not present

## 2023-12-20 DIAGNOSIS — D125 Benign neoplasm of sigmoid colon: Secondary | ICD-10-CM

## 2023-12-20 MED ORDER — SODIUM CHLORIDE 0.9 % IV SOLN: 500.0000 mL | INTRAVENOUS | Status: DC

## 2023-12-20 NOTE — Progress Notes (Signed)
 GASTROENTEROLOGY PROCEDURE H&P NOTE   Primary Care Physician: Rilla Baller, MD    Reason for Procedure:   Colon cancer screening  Plan:    Colonoscopy  Patient is appropriate for endoscopic procedure(s) in the ambulatory ([facility]) setting.  The nature of the procedure, as well as the risks, benefits, and alternatives were carefully and thoroughly reviewed with the patient. Ample time for discussion and questions allowed. The patient understood, was satisfied, and agreed to proceed.     HPI: Erik Smith is a 58 y.o. male who presents for colonoscopy for colon cancer screening. Denies blood in stools, changes in bowel habits, or unintentional weight loss. Denies family history of colon cancer. Last colonoscopy was in 1998 and was normal.  Past Medical History:  Diagnosis Date   Arthritis    Asthma    as a child   Bipolar disorder, unspecified (HCC)    hospitalization 2004 for depression, bipolar, ambien overdose   Cancer (HCC) 10/20/2015   skin ca on nose removed   Cholesteatoma 05/2010   s/p R ear surgery, residual tinnitus, planning on L ear surgery   Depression    bipolar   Family history of ischemic heart disease    History of asthma    Hyperlipidemia    Migraine without aura, without mention of intractable migraine without mention of status migrainosus    Rosacea 2017   Spinal stenosis    Unspecified adjustment reaction     Past Surgical History:  Procedure Laterality Date   CARPOMETACARPEL SUSPENSION PLASTY Left 10/18/2017   Procedure: LEFT THUMB TRAPEZIECTOMY AND SUSPENSIONPLASTY;  Surgeon: Murrell Drivers, MD;  Location: [facility];  Service: Orthopedics;  Laterality: Left;   CARPOMETACARPEL SUSPENSION PLASTY Right 03/28/2018   Procedure: CARPOMETACARPEL [facility]) SUSPENSION PLASTY AND TRAPEZIECTOMY;  Surgeon: Murrell Drivers, MD;  Location: [facility];  Service: Orthopedics;  Laterality: Right;   COLONOSCOPY  1999   normal (in  [place] )   ESOPHAGUS SURGERY  2003   torn esophagus   INNER EAR SURGERY  1975, 1995, 2012, 2013   for cholesteatoma, bilateral first L then R   NECK SURGERY  2002   spinal stenosis and arthritis   perianal abscess  1998   SYNOVIAL CYST EXCISION     1987 approx    Prior to Admission medications   Medication Sig Start Date End Date Taking? Authorizing Provider  ARIPiprazole  (ABILIFY ) 2 MG tablet Take 1 tablet (2 mg total) by mouth at bedtime. 12/03/23  Yes Arfeen, Leni DASEN, MD  atorvastatin  (LIPITOR) 20 MG tablet Take 1 tablet (20 mg total) by mouth daily. 11/30/23  Yes Rilla Baller, MD  doxycycline (VIBRAMYCIN) 100 MG capsule Take 100 mg by mouth 2 (two) times daily. 12/07/23  Yes [provider]  lamoTRIgine  (LAMICTAL ) 150 MG tablet Take 1/2 tab twice daily 12/03/23  Yes Arfeen, Leni DASEN, MD  fluticasone  (FLONASE ) 50 MCG/ACT nasal spray Place 2 sprays into both nostrils daily. 04/28/21   Wendee Lynwood HERO, NP  ibuprofen  (ADVIL ) 200 MG tablet Take 3 tablets (600 mg total) by mouth 2 (two) times daily as needed for moderate pain. 10/21/19   Rilla Baller, MD  levocetirizine (XYZAL ) 5 MG tablet Take 1 tablet (5 mg total) by mouth every evening. 04/28/21   Wendee Lynwood HERO, NP  valACYclovir  (VALTREX ) 500 MG tablet TAKE 1 TABLET BY MOUTH TWICE DAILY 3 DAYS AT A TIME AS NEEDED 07/10/23   Rilla Baller, MD    Current Outpatient Medications  Medication  Sig Dispense Refill   ARIPiprazole  (ABILIFY ) 2 MG tablet Take 1 tablet (2 mg total) by mouth at bedtime. 90 tablet 0   atorvastatin  (LIPITOR) 20 MG tablet Take 1 tablet (20 mg total) by mouth daily. 90 tablet 3   doxycycline (VIBRAMYCIN) 100 MG capsule Take 100 mg by mouth 2 (two) times daily.     lamoTRIgine  (LAMICTAL ) 150 MG tablet Take 1/2 tab twice daily 90 tablet 0   fluticasone  (FLONASE ) 50 MCG/ACT nasal spray Place 2 sprays into both nostrils daily. 16 g 0   ibuprofen  (ADVIL ) 200 MG tablet Take 3 tablets (600 mg total) by mouth  2 (two) times daily as needed for moderate pain.     levocetirizine (XYZAL ) 5 MG tablet Take 1 tablet (5 mg total) by mouth every evening. 30 tablet 0   valACYclovir  (VALTREX ) 500 MG tablet TAKE 1 TABLET BY MOUTH TWICE DAILY 3 DAYS AT A TIME AS NEEDED 30 tablet 3   Current Facility-Administered Medications  Medication Dose Route Frequency Provider Last Rate Last Admin   0.9 %  sodium chloride  infusion  500 mL Intravenous Continuous Federico Rosario BROCKS, MD        Allergies as of 12/20/2023 - Review Complete 12/20/2023  Allergen Reaction Noted   Morphine and codeine  Itching 12/10/2012    Family History  Problem Relation Age of Onset   Sickle cell anemia Mother    Asthma Mother        smoker   Mental illness Mother    Coronary artery disease Father 33   Bladder Cancer Father    Post-traumatic stress disorder Brother    Lung cancer Paternal Grandmother 48       smoker   Prostate cancer Paternal Grandfather 63       deceased from prostate CA   Breast cancer Paternal Uncle    Stroke Neg Hx    Diabetes Neg Hx    Colon cancer Neg Hx    Colon polyps Neg Hx    Esophageal cancer Neg Hx    Rectal cancer Neg Hx    Stomach cancer Neg Hx     Social History   Socioeconomic History   Marital status: Married    Spouse name: Not on file   Number of children: 2   Years of education: Not on file   Highest education level: Not on file  Occupational History   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     Employer: [employer]  Tobacco Use   Smoking status: Never   Smokeless tobacco: Never  Vaping Use   Vaping status: Never Used  Substance and Sexual Activity   Alcohol use: Yes    Alcohol/week: 0.0 standard drinks of alcohol    Comment: 1 beer/day   Drug use: No   Sexual activity: Yes  Other Topics Concern   Not on file  Social History Narrative   Lives with wife    Occ: 5th grade teacher at [employer], now Geophysicist/field seismologist principal at [employer].    Activity: occ  running   Diet: Not a lot of fast food   Social Drivers of Health   Financial Resource Strain: Not on file  Food Insecurity: Not on file  Transportation Needs: Not on file  Physical Activity: Not on file  Stress: Not on file  Social Connections: Not on file  Intimate Partner Violence: Not on file    Physical Exam: Vital signs in last 24 hours: BP (!) 128/92   Pulse 70  Temp 97.9 F (36.6 C) (Temporal)   Ht 5' 10 (1.778 m)   Wt 225 lb (102.1 kg)   SpO2 98%   BMI 32.28 kg/m  GEN: NAD EYE: Sclerae anicteric ENT: MMM CV: Non-tachycardic Pulm: No increased work of breathing GI: Soft, NT/ND NEURO:  Alert & Oriented   Estefana Kidney, MD [facility]  12/20/2023 1:32 PM

## 2023-12-20 NOTE — Progress Notes (Signed)
 Called to room to assist during endoscopic procedure.  Patient ID and intended procedure confirmed with present staff. Received instructions for my participation in the procedure from the performing physician.

## 2023-12-20 NOTE — Patient Instructions (Signed)
 You may resume all of your previous medications today as ordered. The biopsy results will be back in about two weeks. Read your discharge instructions.  YOU HAD AN ENDOSCOPIC PROCEDURE TODAY AT THE [facility]:   Refer to the procedure report that was given to you for any specific questions about what was found during the examination.  If the procedure report does not answer your questions, please call your gastroenterologist to clarify.  If you requested that your care partner not be given the details of your procedure findings, then the procedure report has been included in a sealed envelope for you to review at your convenience later.  YOU SHOULD EXPECT: Some feelings of bloating in the abdomen. Passage of more gas than usual.  Walking can help get rid of the air that was put into your GI tract during the procedure and reduce the bloating. If you had a lower endoscopy (such as a colonoscopy or flexible sigmoidoscopy) you may notice spotting of blood in your stool or on the toilet paper. If you underwent a bowel prep for your procedure, you may not have a normal bowel movement for a few days.  Please Note:  You might notice some irritation and congestion in your nose or some drainage.  This is from the oxygen used during your procedure.  There is no need for concern and it should clear up in a day or so.  SYMPTOMS TO REPORT IMMEDIATELY:  Following lower endoscopy (colonoscopy or flexible sigmoidoscopy):  Excessive amounts of blood in the stool  Significant tenderness or worsening of abdominal pains  Swelling of the abdomen that is new, acute  Fever of 100F or higher   For urgent or emergent issues, a gastroenterologist can be reached at any hour by calling [phone]. Do not use MyChart messaging for urgent concerns.    DIET:  We do recommend a small meal at first, but then you may proceed to your regular diet. You need to eat a high fiber diet, and  drink a lot of water.   Drink plenty of fluids but you should avoid alcoholic beverages for 24 hours.  ACTIVITY:  You should plan to take it easy for the rest of today and you should NOT DRIVE or use heavy machinery until tomorrow (because of the sedation medicines used during the test).    FOLLOW UP: Our staff will call the number listed on your records the next business day following your procedure.  We will call around 7:15- 8:00 am to check on you and address any questions or concerns that you may have regarding the information given to you following your procedure. If we do not reach you, we will leave a message.     If any biopsies were taken you will be contacted by phone or by letter within the next 1-3 weeks.  Please call us  at [phone] if you have not heard about the biopsies in 3 weeks.    SIGNATURES/CONFIDENTIALITY: You and/or your care partner have signed paperwork which will be entered into your electronic medical record.  These signatures attest to the fact that that the information above on your After Visit Summary has been reviewed and is understood.  Full responsibility of the confidentiality of this discharge information lies with you and/or your care-partner.

## 2023-12-20 NOTE — Progress Notes (Signed)
 Pt's states no medical or surgical changes since previsit or office visit.

## 2023-12-20 NOTE — Progress Notes (Signed)
 Vss nad trans to pacu

## 2023-12-20 NOTE — Op Note (Signed)
 [facility] Patient Name: Erik Smith Procedure Date: 12/20/2023 12:30 PM MRN: [id number] Endoscopist: Rosario Estefana Kidney , , [id number] Age: 58 Referring MD:  Date of Birth: [birth year] Gender: Male Account #: [id number] Procedure:                Colonoscopy Indications:              Screening for colorectal malignant neoplasm Medicines:                Monitored Anesthesia Care Procedure:                Pre-Anesthesia Assessment:                           - Prior to the procedure, a History and Physical                            was performed, and patient medications and                            allergies were reviewed. The patient's tolerance of                            previous anesthesia was also reviewed. The risks                            and benefits of the procedure and the sedation                            options and risks were discussed with the patient.                            All questions were answered, and informed consent                            was obtained. Prior Anticoagulants: The patient has                            taken no anticoagulant or antiplatelet agents. ASA                            Grade Assessment: II - A patient with mild systemic                            disease. After reviewing the risks and benefits,                            the patient was deemed in satisfactory condition to                            undergo the procedure.                           After obtaining informed consent, the colonoscope  was passed under direct vision. Throughout the                            procedure, the patient's blood pressure, pulse, and                            oxygen saturations were monitored continuously. The                            Olympus Scope SN: [id number] was introduced through                            the anus and advanced to the the terminal ileum.                            The  colonoscopy was performed without difficulty.                            The patient tolerated the procedure well. The                            quality of the bowel preparation was good. The                            terminal ileum, ileocecal valve, appendiceal                            orifice, and rectum were photographed. Scope In: 1:37:16 PM Scope Out: 1:53:04 PM Scope Withdrawal Time: 0 hours 10 minutes 52 seconds  Total Procedure Duration: 0 hours 15 minutes 48 seconds  Findings:                 The terminal ileum appeared normal.                           Multiple diverticula were found in the sigmoid                            colon, descending colon and transverse colon.                           A 10 mm polyp was found in the sigmoid colon. The                            polyp was pedunculated. The polyp was removed with                            a hot snare. Resection and retrieval were complete.                           Non-bleeding internal hemorrhoids were found during                            retroflexion. Complications:  No immediate complications. Estimated Blood Loss:     Estimated blood loss was minimal. Impression:               - The examined portion of the ileum was normal.                           - Diverticulosis in the sigmoid colon, in the                            descending colon and in the transverse colon.                           - One 10 mm polyp in the sigmoid colon, removed                            with a hot snare. Resected and retrieved.                           - Non-bleeding internal hemorrhoids. Recommendation:           - Discharge patient to home (with escort).                           - Await pathology results.                           - The findings and recommendations were discussed                            with the patient. Dr Estefana Federico Rosario Estefana Federico,  12/20/2023 1:59:44 PM

## 2023-12-21 ENCOUNTER — Telehealth: Payer: Self-pay | Admitting: *Deleted

## 2023-12-21 NOTE — Telephone Encounter (Signed)
  Follow up Call-     12/20/2023    1:01 PM  Call back number  Post procedure Call Back phone  # [phone]  Permission to leave phone message Yes     Patient questions:  Do you have a fever, pain , or abdominal swelling? No. Pain Score  0 *  Have you tolerated food without any problems? Yes.    Have you been able to return to your normal activities? Yes.    Do you have any questions about your discharge instructions: Diet   No. Medications  No. Follow up visit  No.  Do you have questions or concerns about your Care? No.  Actions: * If pain score is 4 or above: No action needed, pain <4.

## 2023-12-24 ENCOUNTER — Ambulatory Visit (INDEPENDENT_AMBULATORY_CARE_PROVIDER_SITE_OTHER): Admitting: Psychology

## 2023-12-24 DIAGNOSIS — F319 Bipolar disorder, unspecified: Secondary | ICD-10-CM

## 2023-12-24 NOTE — Progress Notes (Signed)
 V                                                                              Name: Erik Smith Date: 12/24/2023 MRN: [id number] DOB: 1966-03-03 PCP: Rilla Baller, MD     [name] participated from home, via video, and consented to treatment. Therapist participated from home office.   Guardian/Payee:  N/A    Paperwork requested: No   Reason for Visit /Presenting Problem: Improve/manage feeling of agitation  Mental Status Exam: Appearance:   Fairly Groomed     Behavior:  Appropriate  Motor:  Normal  Speech/Language:   Normal Rate  Affect:  Appropriate  Mood:  normal  Thought process:  normal  Thought content:    WNL  Sensory/Perceptual disturbances:    WNL  Orientation:  oriented to person, place, and situation  Attention:  Good  Concentration:  Good  Memory:  WNL  Fund of knowledge:   Good  Insight:    Good  Judgment:   Good  Impulse Control:  Good     Reported Symptoms:  Agitation, frustration, worry  Risk Assessment: Danger to Self:  No Self-injurious Behavior: No Danger to Others: No Duty to Warn:no Physical Aggression / Violence:No  Access to Firearms a concern: unknown Gang Involvement:No  Patient / guardian was educated about steps to take if suicide or homicide risk level increases between visits: n/a While future psychiatric events cannot be accurately predicted, the patient does not currently require acute inpatient psychiatric care and does not currently meet [place]  involuntary commitment criteria.  Substance Abuse History: Current substance abuse: No     Past Psychiatric History:   Previous psychological history is significant for Bipolar Mania Outpatient Providers:Dr. Slater Darner History of Psych  Hospitalization: Yes  Psychological Testing: N/A   Abuse History:  Victim of: No., N/A   Report needed: No. Victim of Neglect:No. Perpetrator of N/A  Witness / Exposure to Domestic Violence: No   Protective Services Involvement: No  Witness to [employer] Violence:  No   Family History:  Family History  Problem Relation Age of Onset   Sickle cell anemia Mother    Asthma Mother        smoker   Mental illness Mother    Coronary artery disease Father 40   Bladder Cancer Father    Post-traumatic stress disorder Brother    Lung cancer Paternal Grandmother 38       smoker   Prostate cancer Paternal Grandfather 25       deceased from prostate CA   Breast cancer Paternal Uncle    Stroke Neg Hx    Diabetes Neg Hx    Colon cancer Neg Hx    Colon polyps Neg Hx    Esophageal cancer Neg  Hx    Rectal cancer Neg Hx    Stomach cancer Neg Hx     Living situation: the patient lives with their spouse  Sexual Orientation: Straight  Relationship Status: married  Name of spouse / other:[name] If a parent, number of children / ages:2 boys, 55 & 15  Support Systems: family  Financial Stress:  No   Income/Employment/Disability: Employment  Financial planner: unknown  Educational History: Education: post Engineer, maintenance (IT) work or degree  Religion/Sprituality/World View: unknown  Any cultural differences that may affect / interfere with treatment:  not applicable   Recreation/Hobbies: woodworking  Stressors: Occupational concerns    Strengths: Family  Barriers:  unknown   Legal History: Pending legal issue / charges: The patient has no significant history of legal issues. History of legal issue / charges: N/A  Medical History/Surgical History: not reviewed Past Medical History:  Diagnosis Date   Arthritis    Asthma    as a child   Bipolar disorder, unspecified (HCC)    hospitalization 2004 for depression, bipolar, ambien overdose   Cancer (HCC) 10/20/2015   skin ca  on nose removed   Cholesteatoma 05/2010   s/p R ear surgery, residual tinnitus, planning on L ear surgery   Depression    bipolar   Family history of ischemic heart disease    History of asthma    Hyperlipidemia    Migraine without aura, without mention of intractable migraine without mention of status migrainosus    Rosacea 2017   Spinal stenosis    Unspecified adjustment reaction     Past Surgical History:  Procedure Laterality Date   CARPOMETACARPEL SUSPENSION PLASTY Left 10/18/2017   Procedure: LEFT THUMB TRAPEZIECTOMY AND SUSPENSIONPLASTY;  Surgeon: Murrell Drivers, MD;  Location: [facility];  Service: Orthopedics;  Laterality: Left;   CARPOMETACARPEL SUSPENSION PLASTY Right 03/28/2018   Procedure: CARPOMETACARPEL [facility]) SUSPENSION PLASTY AND TRAPEZIECTOMY;  Surgeon: Murrell Drivers, MD;  Location: [facility];  Service: Orthopedics;  Laterality: Right;   COLONOSCOPY  1999   normal (in [place] )   ESOPHAGUS SURGERY  2003   torn esophagus   INNER EAR SURGERY  1975, 1995, 2012, 2013   for cholesteatoma, bilateral first L then R   NECK SURGERY  2002   spinal stenosis and arthritis   perianal abscess  1998   SYNOVIAL CYST EXCISION     1987 approx    Medications: Current Outpatient Medications  Medication Sig Dispense Refill   ARIPiprazole  (ABILIFY ) 2 MG tablet Take 1 tablet (2 mg total) by mouth at bedtime. 90 tablet 0   atorvastatin  (LIPITOR) 20 MG tablet Take 1 tablet (20 mg total) by mouth daily. 90 tablet 3   doxycycline (VIBRAMYCIN) 100 MG capsule Take 100 mg by mouth 2 (two) times daily.     fluticasone  (FLONASE ) 50 MCG/ACT nasal spray Place 2 sprays into both nostrils daily. 16 g 0   ibuprofen  (ADVIL ) 200 MG tablet Take 3 tablets (600 mg total) by mouth 2 (two) times daily as needed for moderate pain.     lamoTRIgine  (LAMICTAL ) 150 MG tablet Take 1/2 tab twice daily 90 tablet 0   levocetirizine (XYZAL ) 5 MG tablet Take 1 tablet (5 mg total)  by mouth every evening. 30 tablet 0   valACYclovir  (VALTREX ) 500 MG tablet TAKE 1 TABLET BY MOUTH TWICE DAILY 3 DAYS AT A TIME AS NEEDED 30 tablet 3   No current facility-administered medications for this visit.    Allergies  Allergen Reactions  Morphine And Codeine  Itching            Goals/Plan: Patient is seeking counseling for mood stabilization. Will utilize cognitive strategies to help manage feelings of anxiety and despair. Revised target date is 12-25 Monitor medication compliance -Ongoing.   Patient agrees to a video Public affairs consultant) meeting and is aware of platform limitations. He is at home and provider is in home office.  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 The big party at the house was on Saturday. There were 30 people at the event. He is going to [place]  next week for 3 days. Needs to check on mother. His brother has blocked him from having communication. He will be meeting with attorneys to help mother manage affairs. He feels stable even though upset of mother's situation. Confident he will get it all resolved.                                                                                                                                                     Diagnoses:  Bipolar Disorder  Plan of Care: Outpatient Therapy and psychotropic medication   CONI ALM KERNS, PhD Time:4:15p-5:00p 45 min.

## 2023-12-25 LAB — SURGICAL PATHOLOGY

## 2023-12-26 ENCOUNTER — Ambulatory Visit: Payer: Self-pay | Admitting: Internal Medicine

## 2024-01-21 ENCOUNTER — Ambulatory Visit: Admitting: Psychology

## 2024-01-21 DIAGNOSIS — F319 Bipolar disorder, unspecified: Secondary | ICD-10-CM | POA: Diagnosis not present

## 2024-01-21 NOTE — Progress Notes (Signed)
 V                                                                              Name: Erik Smith Date: 01/21/2024 MRN: [id number] DOB: 27-Apr-1966 PCP: Rilla Baller, MD     Erik Smith participated from home, via video, and consented to treatment. Therapist participated from home office.   Guardian/Payee:  N/A    Paperwork requested: No   Reason for Visit /Presenting Problem: Improve/manage feeling of agitation  Mental Status Exam: Appearance:   Fairly Groomed     Behavior:  Appropriate  Motor:  Normal  Speech/Language:   Normal Rate  Affect:  Appropriate  Mood:  normal  Thought process:  normal  Thought content:    WNL  Sensory/Perceptual disturbances:    WNL  Orientation:  oriented to person, place, and situation  Attention:  Good  Concentration:  Good  Memory:  WNL  Fund of knowledge:   Good  Insight:    Good  Judgment:   Good  Impulse Control:  Good     Reported Symptoms:  Agitation, frustration, worry  Risk Assessment: Danger to Self:  No Self-injurious Behavior: No Danger to Others: No Duty to Warn:no Physical Aggression / Violence:No  Access to Firearms a concern: unknown Gang Involvement:No  Patient / guardian was educated about steps to take if suicide or homicide risk level increases between visits: n/a While future psychiatric events cannot be accurately predicted, the patient does not currently require acute inpatient psychiatric care and does not currently meet [place]  involuntary commitment criteria.  Substance Abuse History: Current substance abuse: No     Past Psychiatric History:   Previous psychological history is significant for Bipolar Mania Outpatient Providers:Erik Smith History of Psych  Hospitalization: Yes  Psychological Testing: N/A   Abuse History:  Victim of: No., N/A   Report needed: No. Victim of Neglect:No. Perpetrator of N/A  Witness / Exposure to Domestic Violence: No   Protective Services Involvement: No  Witness to [employer] Violence:  No   Family History:  Family History  Problem Relation Age of Onset   Sickle cell anemia Mother    Asthma Mother        smoker   Mental illness Mother    Coronary artery disease Father 53   Bladder Cancer Father    Post-traumatic stress disorder Brother    Lung cancer Paternal Grandmother 71       smoker   Prostate cancer Paternal Grandfather 87       deceased from prostate CA   Breast cancer Paternal Uncle    Stroke Neg Hx    Diabetes Neg Hx    Colon cancer Neg Hx    Colon polyps Neg Hx    Esophageal cancer Neg  Hx    Rectal cancer Neg Hx    Stomach cancer Neg Hx     Living situation: the patient lives with their spouse  Sexual Orientation: Straight  Relationship Status: married  Name of spouse / other:Erik Smith If a parent, number of children / ages:2 boys, 35 & 15  Support Systems: family  Financial Stress:  No   Income/Employment/Disability: Employment  Financial planner: unknown  Educational History: Education: post Engineer, maintenance (IT) work or degree  Religion/Sprituality/World View: unknown  Any cultural differences that may affect / interfere with treatment:  not applicable   Recreation/Hobbies: woodworking  Stressors: Occupational concerns    Strengths: Family  Barriers:  unknown   Legal History: Pending legal issue / charges: The patient has no significant history of legal issues. History of legal issue / charges: N/A  Medical History/Surgical History: not reviewed Past Medical History:  Diagnosis Date   Arthritis    Asthma    as a child   Bipolar disorder, unspecified (HCC)    hospitalization 2004 for depression, bipolar, ambien overdose   Cancer (HCC) 10/20/2015   skin ca  on nose removed   Cholesteatoma 05/2010   s/p R ear surgery, residual tinnitus, planning on L ear surgery   Depression    bipolar   Family history of ischemic heart disease    History of asthma    Hyperlipidemia    Migraine without aura, without mention of intractable migraine without mention of status migrainosus    Rosacea 2017   Spinal stenosis    Unspecified adjustment reaction     Past Surgical History:  Procedure Laterality Date   CARPOMETACARPEL SUSPENSION PLASTY Left 10/18/2017   Procedure: LEFT THUMB TRAPEZIECTOMY AND SUSPENSIONPLASTY;  Surgeon: Murrell Drivers, MD;  Location: [facility];  Service: Orthopedics;  Laterality: Left;   CARPOMETACARPEL SUSPENSION PLASTY Right 03/28/2018   Procedure: CARPOMETACARPEL [facility]) SUSPENSION PLASTY AND TRAPEZIECTOMY;  Surgeon: Murrell Drivers, MD;  Location: [facility];  Service: Orthopedics;  Laterality: Right;   COLONOSCOPY  1999   normal (in [place] )   ESOPHAGUS SURGERY  2003   torn esophagus   INNER EAR SURGERY  1975, 1995, 2012, 2013   for cholesteatoma, bilateral first L then R   NECK SURGERY  2002   spinal stenosis and arthritis   perianal abscess  1998   SYNOVIAL CYST EXCISION     1987 approx    Medications: Current Outpatient Medications  Medication Sig Dispense Refill   ARIPiprazole  (ABILIFY ) 2 MG tablet Take 1 tablet (2 mg total) by mouth at bedtime. 90 tablet 0   atorvastatin  (LIPITOR) 20 MG tablet Take 1 tablet (20 mg total) by mouth daily. 90 tablet 3   doxycycline (VIBRAMYCIN) 100 MG capsule Take 100 mg by mouth 2 (two) times daily.     fluticasone  (FLONASE ) 50 MCG/ACT nasal spray Place 2 sprays into both nostrils daily. 16 g 0   ibuprofen  (ADVIL ) 200 MG tablet Take 3 tablets (600 mg total) by mouth 2 (two) times daily as needed for moderate pain.     lamoTRIgine  (LAMICTAL ) 150 MG tablet Take 1/2 tab twice daily 90 tablet 0   levocetirizine (XYZAL ) 5 MG tablet Take 1 tablet (5 mg total)  by mouth every evening. 30 tablet 0   valACYclovir  (VALTREX ) 500 MG tablet TAKE 1 TABLET BY MOUTH TWICE DAILY 3 DAYS AT A TIME AS NEEDED 30 tablet 3   No current facility-administered medications for this visit.    Allergies  Allergen Reactions  Morphine And Codeine  Itching            Goals/Plan: Patient is seeking counseling for mood stabilization. Will utilize cognitive strategies to help manage feelings of anxiety and despair. Revised target date is 12-25 Monitor medication compliance -Ongoing.   Patient agrees to a video Public affairs consultant) meeting and is aware of platform limitations. He is at home and provider is in home office.  Session Notes: Garnet says that he is back at school full time and it is fine and he is tired. Lots of start of the year paperwork. There is nothing new with his mother and he says there is nothing he can do about the situation with his brother. Kurt says he is not going to visit because he feels it will be useless. Son is applying to colleges and it is stressful. His moods are stable and he feels good about the coming year.                                                                                                                                                       Diagnoses:  Bipolar Disorder  Plan of Care: Outpatient Therapy and psychotropic medication   CONI ALM KERNS, PhD Time:4:15p-5:00p 45 min.

## 2024-02-04 ENCOUNTER — Ambulatory Visit: Admitting: Psychology

## 2024-02-04 DIAGNOSIS — F319 Bipolar disorder, unspecified: Secondary | ICD-10-CM | POA: Diagnosis not present

## 2024-02-04 NOTE — Progress Notes (Signed)
 V                                                                              Name: Erik Smith Date: 02/04/2024 MRN: [id number] DOB: 1965/08/16 PCP: Rilla Baller, MD     [name] participated from home, via video, and consented to treatment. Therapist participated from home office.   Guardian/Payee:  N/A    Paperwork requested: No   Reason for Visit /Presenting Problem: Improve/manage feeling of agitation  Mental Status Exam: Appearance:   Fairly Groomed     Behavior:  Appropriate  Motor:  Normal  Speech/Language:   Normal Rate  Affect:  Appropriate  Mood:  normal  Thought process:  normal  Thought content:    WNL  Sensory/Perceptual disturbances:    WNL  Orientation:  oriented to person, place, and situation  Attention:  Good  Concentration:  Good  Memory:  WNL  Fund of knowledge:   Good  Insight:    Good  Judgment:   Good  Impulse Control:  Good     Reported Symptoms:  Agitation, frustration, worry  Risk Assessment: Danger to Self:  No Self-injurious Behavior: No Danger to Others: No Duty to Warn:no Physical Aggression / Violence:No  Access to Firearms a concern: unknown Gang Involvement:No  Patient / guardian was educated about steps to take if suicide or homicide risk level increases between visits: n/a While future psychiatric events cannot be accurately predicted, the patient does not currently require acute inpatient psychiatric care and does not currently meet [place]  involuntary commitment criteria.  Substance Abuse History: Current substance abuse: No     Past Psychiatric History:   Previous psychological history is significant for Bipolar Mania Outpatient Providers:Dr. Slater Darner History of Psych Hospitalization: Yes  Psychological Testing: N/A   Abuse History:  Victim of: No., N/A   Report needed: No. Victim of Neglect:No. Perpetrator of N/A  Witness / Exposure to Domestic Violence: No   Protective Services Involvement: No  Witness to [employer] Violence:  No   Family History:  Family History  Problem Relation Age of Onset   Sickle cell anemia Mother    Asthma Mother        smoker   Mental illness Mother    Coronary artery disease Father 49   Bladder Cancer Father    Post-traumatic stress disorder Brother    Lung cancer Paternal Grandmother 35       smoker   Prostate cancer Paternal Grandfather 44       deceased from prostate CA   Breast cancer Paternal Uncle    Stroke Neg Hx    Diabetes Neg Hx    Colon cancer  Neg Hx    Colon polyps Neg Hx    Esophageal cancer Neg Hx    Rectal cancer Neg Hx    Stomach cancer Neg Hx     Living situation: the patient lives with their spouse  Sexual Orientation: Straight  Relationship Status: married  Name of spouse / other:[name] If a parent, number of children / ages:2 boys, 35 & 15  Support Systems: family  Financial Stress:  No   Income/Employment/Disability: Employment  Financial planner: unknown  Educational History: Education: post Engineer, maintenance (IT) work or degree  Religion/Sprituality/World View: unknown  Any cultural differences that may affect / interfere with treatment:  not applicable   Recreation/Hobbies: woodworking  Stressors: Occupational concerns    Strengths: Family  Barriers:  unknown   Legal History: Pending legal issue / charges: The patient has no significant history of legal issues. History of legal issue / charges: N/A  Medical History/Surgical History: not reviewed Past Medical History:  Diagnosis Date   Arthritis    Asthma    as a child   Bipolar disorder, unspecified (HCC)    hospitalization 2004 for depression, bipolar, ambien overdose   Cancer  (HCC) 10/20/2015   skin ca on nose removed   Cholesteatoma 05/2010   s/p R ear surgery, residual tinnitus, planning on L ear surgery   Depression    bipolar   Family history of ischemic heart disease    History of asthma    Hyperlipidemia    Migraine without aura, without mention of intractable migraine without mention of status migrainosus    Rosacea 2017   Spinal stenosis    Unspecified adjustment reaction     Past Surgical History:  Procedure Laterality Date   CARPOMETACARPEL SUSPENSION PLASTY Left 10/18/2017   Procedure: LEFT THUMB TRAPEZIECTOMY AND SUSPENSIONPLASTY;  Surgeon: Murrell Drivers, MD;  Location: [facility];  Service: Orthopedics;  Laterality: Left;   CARPOMETACARPEL SUSPENSION PLASTY Right 03/28/2018   Procedure: CARPOMETACARPEL [facility]) SUSPENSION PLASTY AND TRAPEZIECTOMY;  Surgeon: Murrell Drivers, MD;  Location: [facility];  Service: Orthopedics;  Laterality: Right;   COLONOSCOPY  1999   normal (in [place] )   ESOPHAGUS SURGERY  2003   torn esophagus   INNER EAR SURGERY  1975, 1995, 2012, 2013   for cholesteatoma, bilateral first L then R   NECK SURGERY  2002   spinal stenosis and arthritis   perianal abscess  1998   SYNOVIAL CYST EXCISION     1987 approx    Medications: Current Outpatient Medications  Medication Sig Dispense Refill   ARIPiprazole  (ABILIFY ) 2 MG tablet Take 1 tablet (2 mg total) by mouth at bedtime. 90 tablet 0   atorvastatin  (LIPITOR) 20 MG tablet Take 1 tablet (20 mg total) by mouth daily. 90 tablet 3   doxycycline (VIBRAMYCIN) 100 MG capsule Take 100 mg by mouth 2 (two) times daily.     fluticasone  (FLONASE ) 50 MCG/ACT nasal spray Place 2 sprays into both nostrils daily. 16 g 0   ibuprofen  (ADVIL ) 200 MG tablet Take 3 tablets (600 mg total) by mouth 2 (two) times daily as needed for moderate pain.     lamoTRIgine  (LAMICTAL ) 150 MG tablet Take 1/2 tab twice daily 90 tablet 0   levocetirizine (XYZAL ) 5 MG tablet  Take 1 tablet (5 mg total) by mouth every evening. 30 tablet 0   valACYclovir  (VALTREX ) 500 MG tablet TAKE 1 TABLET BY MOUTH TWICE DAILY 3 DAYS AT A TIME AS NEEDED 30 tablet 3  No current facility-administered medications for this visit.    Allergies  Allergen Reactions   Morphine And Codeine  Itching            Goals/Plan: Patient is seeking counseling for mood stabilization. Will utilize cognitive strategies to help manage feelings of anxiety and despair. Revised target date is 12-25 Monitor medication compliance -Ongoing.   Patient agrees to a video Public affairs consultant) meeting and is aware of platform limitations. He is at home and provider is in home office.  Session Notes: Alucard talked about his brother who contacted his school to say he should be fired because of a post regarding Bebe Ahle. Ejay is not on social media, but he sent something to his rational brother (that was without judgement) who forwarded it to his disturbed brother. His younger brother is trying to get him fired. He will be talking to his attorney to see about his rights as his mother's legal guardian. He was contacted by his employer to explain what he had written. They told him he is not in trouble or in danger of losing his job. We discussed his next steps with family. This was a terrible experience, but Issiac kept his cool and did not over-react.                                                                                                                                                           Diagnoses:  Bipolar Disorder  Plan of Care: Outpatient Therapy and psychotropic medication   CONI ALM KERNS, PhD Time:4:15p-5:00p 45 min.

## 2024-02-05 ENCOUNTER — Encounter (HOSPITAL_COMMUNITY): Payer: Self-pay

## 2024-02-05 ENCOUNTER — Telehealth (HOSPITAL_BASED_OUTPATIENT_CLINIC_OR_DEPARTMENT_OTHER): Payer: Self-pay | Admitting: Psychiatry

## 2024-02-05 DIAGNOSIS — Z91199 Patient's noncompliance with other medical treatment and regimen due to unspecified reason: Secondary | ICD-10-CM

## 2024-02-05 NOTE — Progress Notes (Signed)
 Patient is not on video platform.  Tried to reach on his cell, no one pick up the phone and left a message to call back

## 2024-02-06 ENCOUNTER — Telehealth (HOSPITAL_COMMUNITY): Admitting: Psychiatry

## 2024-02-06 ENCOUNTER — Encounter (HOSPITAL_COMMUNITY): Payer: Self-pay | Admitting: Psychiatry

## 2024-02-06 VITALS — Wt 220.0 lb

## 2024-02-06 DIAGNOSIS — F410 Panic disorder [episodic paroxysmal anxiety] without agoraphobia: Secondary | ICD-10-CM

## 2024-02-06 DIAGNOSIS — F319 Bipolar disorder, unspecified: Secondary | ICD-10-CM | POA: Diagnosis not present

## 2024-02-06 MED ORDER — ARIPIPRAZOLE 2 MG PO TABS: 2.0000 mg | ORAL_TABLET | Freq: Every day | ORAL | 0 refills | Status: AC

## 2024-02-06 MED ORDER — LAMOTRIGINE 150 MG PO TABS: ORAL_TABLET | ORAL | 0 refills | Status: AC

## 2024-02-06 NOTE — Progress Notes (Signed)
 [facility] MD Virtual Progress Note   Patient Location: Work Provider Location: Home Office  I connect with patient by video and verified that I am speaking with correct person by using two identifiers. I discussed the limitations of evaluation and management by telemedicine and the availability of in person appointments. I also discussed with the patient that there may be a patient responsible charge related to this service. The patient expressed understanding and agreed to proceed.  Erik Smith [id number] 58 y.o.  02/06/2024 11:21 AM  History of Present Illness:  Patient is evaluated by video session.  He apologized missing his last appointment.  He reported things are going very well and increased dose of Lamictal  is helping his mood and irritability.  He does not get upset or agitated and he when he got upset it is not as intense.  He reported a better relationship with his wife.  He is sleeping good.  He [place] his [place] home and now he has planned to spend weekends there.  He denies any rash, itching, tremors or shakes.  His stepson who lives in [place] is getting married in November.  Patient told his own son who is a Holiday representative at school is doing very well.  Patient reported job is also going very well and he is busy.  Patient lives with his wife who is a Risk analyst.  Patient works for [employer] system.  He is in therapy with Dr. Gutterman.  He denies any hallucination, paranoia, active or passive suicidal thoughts or homicidal thoughts.  He denies any mania or panic attack.  He has no tremor or shakes or any EPS.  He is consistent with Abilify  2 mg and Lamictal  75 mg twice a day.  His appetite is okay.  He is losing weight.  His insurance did not approve Zepbound  but he is watching his calorie intake and exercise.  Denies drinking or using any illegal substances.  Past Psychiatric History: History of inpatient in 2004.  History of overdose,  mania, depression, panic attacks.  History of impulsive eating and excessive buying and taking multiple decisions.  Tried Lexapro, Celexa , lithium , risperidone .  Saw Dr. Rojelio Molt who retired.   Past Medical History:  Diagnosis Date   Arthritis    Asthma    as a child   Bipolar disorder, unspecified (HCC)    hospitalization 2004 for depression, bipolar, ambien overdose   Cancer (HCC) 10/20/2015   skin ca on nose removed   Cholesteatoma 05/2010   s/p R ear surgery, residual tinnitus, planning on L ear surgery   Depression    bipolar   Family history of ischemic heart disease    History of asthma    Hyperlipidemia    Migraine without aura, without mention of intractable migraine without mention of status migrainosus    Rosacea 2017   Spinal stenosis    Unspecified adjustment reaction     Outpatient Encounter Medications as of 02/06/2024  Medication Sig   ARIPiprazole  (ABILIFY ) 2 MG tablet Take 1 tablet (2 mg total) by mouth at bedtime.   atorvastatin  (LIPITOR) 20 MG tablet Take 1 tablet (20 mg total) by mouth daily.   doxycycline (VIBRAMYCIN) 100 MG capsule Take 100 mg by mouth 2 (two) times daily.   fluticasone  (FLONASE ) 50 MCG/ACT nasal spray Place 2 sprays into both nostrils daily.   ibuprofen  (ADVIL ) 200 MG tablet Take 3 tablets (600 mg total) by mouth 2 (two) times daily as needed for moderate  pain.   lamoTRIgine  (LAMICTAL ) 150 MG tablet Take 1/2 tab twice daily   levocetirizine (XYZAL ) 5 MG tablet Take 1 tablet (5 mg total) by mouth every evening.   valACYclovir  (VALTREX ) 500 MG tablet TAKE 1 TABLET BY MOUTH TWICE DAILY 3 DAYS AT A TIME AS NEEDED   No facility-administered encounter medications on file as of 02/06/2024.    Recent Results (from the past 2160 hours)  Surgical pathology ([facility])     Status: None   Collection Time: 12/20/23 12:00 AM  Result Value Ref Range   SURGICAL PATHOLOGY      SURGICAL PATHOLOGY [facility] [address],  [address] [place], [place] [address] Telephone [phone] or [phone] Fax [phone]  REPORT OF SURGICAL PATHOLOGY   Accession #: [id number] Patient Name: UNDREA, SHIPES Visit # : [id number]  MRN: [id number] Physician: Federico Rosario Stagger DOB/Age [birth year] (Age: 89) Gender: M Collected Date: 12/20/2023 Received Date: 12/24/2023  FINAL DIAGNOSIS       1. Surgical [P], colon, sigmoid, polyp (1) :       - TRADITIONAL SERRATED ADENOMA (TSA)      - NEGATIVE FOR HIGH-GRADE DYSPLASIA OR MALIGNANCY       DATE SIGNED OUT: 12/25/2023 ELECTRONIC SIGNATURE : Rebbecca Md, Nilesh, Pathologist, Electronic Signature  MICROSCOPIC DESCRIPTION  CASE COMMENTS STAINS USED IN DIAGNOSIS: H&E H&E-2 H&E-3    CLINICAL HISTORY  SPECIMEN(S) OBTAINED 1. Surgical [P], Colon, Sigmoid, Polyp (1)  SPECIMEN COMMENTS: 1. Special screening for malignant neoplasms, colon; benign neoplasm of sigmoid c olon SPECIMEN CLINICAL INFORMATION: 1. R/O adenoma    Gross Description 1. Received in formalin are tan, soft tissue fragments that are submitted in toto. Number: 1 Size: 0.5 cm = 2 sec, (1B) ( TA )        Report signed out from the following location(s) [facility] [address] [place], [place] [address] CLIA #: [id number]  [facility] [address] [place], [place] [address] CLIA #: [id number]      Psychiatric Specialty Exam: Physical Exam  Review of Systems  Weight 220 lb (99.8 kg).There is no height or weight on file to calculate BMI.  General Appearance: Casual  Eye Contact:  Good  Speech:  Clear and Coherent and Normal Rate  Volume:  Normal  Mood:  Euthymic  Affect:  Congruent  Thought Process:  Goal Directed  Orientation:  Full (Time, Place, and Person)  Thought Content:  Logical  Suicidal Thoughts:  No  Homicidal Thoughts:  No  Memory:  Immediate;   Good Recent;   Good Remote;   Good  Judgement:  Good  Insight:   Good  Psychomotor Activity:  Normal  Concentration:  Concentration: Good and Attention Span: Good  Recall:  Good  Fund of Knowledge:  Good  Language:  Good  Akathisia:  No  Handed:  Right  AIMS (if indicated):     Assets:  Communication Skills Desire for Improvement Housing Social Support Talents/Skills Transportation  ADL's:  Intact  Cognition:  WNL  Sleep:  ok       11/02/2023    9:11 AM 10/30/2022    8:06 AM 10/28/2021    9:01 AM 10/22/2020    9:18 AM 10/21/2019   11:46 AM  Depression screen PHQ 2/9  Decreased Interest 0 0 1 0 0  Down, Depressed, Hopeless 0 1 1 1  0  PHQ - 2 Score 0 1 2 1  0  Altered sleeping  0 0 0 0   Tired, decreased energy 0 0 1 0   Change in appetite 1 2 3  0   Feeling bad or failure about yourself  0 1 2 1    Trouble concentrating 0 0 1 0   Moving slowly or fidgety/restless 0 0 0 0   Suicidal thoughts 0 0 0 0   PHQ-9 Score 1 4 9 2    Difficult doing work/chores Not difficult at all Not difficult at all Somewhat difficult      Assessment/Plan: Bipolar 1 disorder (HCC) - Plan: ARIPiprazole  (ABILIFY ) 2 MG tablet, lamoTRIgine  (LAMICTAL ) 150 MG tablet  Panic attacks - Plan: lamoTRIgine  (LAMICTAL ) 150 MG tablet  Patient doing better since increased dose of Lamictal  on the last appointment.  He is taking now 75 mg twice a day.  So far no dizziness and tolerating very well.  Encouraged to continue Lamictal  150 mg in divided doses, Abilify  2 mg daily.  He has no panic attack or any mania.  Encourage walking, exercise and continue therapy with Mr. Gutterman.  Recommend to call back if is any question or any concern.  Follow-up in 3 months.   Follow Up Instructions:     I discussed the assessment and treatment plan with the patient. The patient was provided an opportunity to ask questions and all were answered. The patient agreed with the plan and demonstrated an understanding of the instructions.   The patient was advised to call back or seek an in-person  evaluation if the symptoms worsen or if the condition fails to improve as anticipated.    Collaboration of Care: Other provider involved in patient's care AEB notes are available in epic to review  Patient/Guardian was advised Release of Information must be obtained prior to any record release in order to collaborate their care with an outside provider. Patient/Guardian was advised if they have not already done so to contact the registration department to sign all necessary forms in order for us  to release information regarding their care.   Consent: Patient/Guardian gives verbal consent for treatment and assignment of benefits for services provided during this visit. Patient/Guardian expressed understanding and agreed to proceed.     Total encounter time 15 minutes which includes face-to-face time, chart reviewed, care coordination, order entry and documentation during this encounter.   Note: This document was prepared by [employer] voice dictation technology and any errors that results from this process are unintentional.    Leni ONEIDA Client, MD 02/06/2024

## 2024-02-18 ENCOUNTER — Ambulatory Visit: Admitting: Psychology

## 2024-03-03 ENCOUNTER — Ambulatory Visit (INDEPENDENT_AMBULATORY_CARE_PROVIDER_SITE_OTHER): Admitting: Psychology

## 2024-03-03 DIAGNOSIS — F319 Bipolar disorder, unspecified: Secondary | ICD-10-CM

## 2024-03-03 NOTE — Progress Notes (Signed)
 Name: Erik Smith Date: 03/03/2024 MRN: [id number] DOB: May 12, 1965 PCP: Rilla Baller, MD     [name] participated from home, via video, and consented to treatment. Therapist participated from home office.   Guardian/Payee:  N/A    Paperwork requested: No   Reason for Visit /Presenting Problem: Improve/manage feeling of agitation  Mental Status Exam: Appearance:   Fairly Groomed     Behavior:  Appropriate  Motor:  Normal  Speech/Language:   Normal Rate  Affect:  Appropriate  Mood:  normal  Thought process:  normal  Thought content:    WNL  Sensory/Perceptual disturbances:    WNL  Orientation:  oriented to person, place, and situation  Attention:  Good  Concentration:  Good  Memory:  WNL  Fund of knowledge:   Good  Insight:    Good  Judgment:   Good  Impulse Control:  Good     Reported Symptoms:  Agitation, frustration, worry  Risk Assessment: Danger to Self:  No Self-injurious Behavior: No Danger to Others: No Duty to Warn:no Physical Aggression / Violence:No  Access to Firearms a concern: unknown Gang Involvement:No  Patient / guardian was educated about steps to take if suicide or homicide risk level increases between visits: n/a While future psychiatric events cannot be accurately predicted, the patient does not currently require acute inpatient psychiatric care and does not currently meet [place]  involuntary commitment criteria.  Substance Abuse History: Current substance abuse: No     Past Psychiatric History:   Previous psychological history is significant for Bipolar Mania Outpatient Providers:Dr. Slater Darner History of Psych Hospitalization: Yes  Psychological Testing: N/A   Abuse History:  Victim of: No., N/A   Report needed: No. Victim of Neglect:No. Perpetrator of N/A  Witness / Exposure to Domestic Violence: No   Protective Services Involvement: No  Witness to [employer] Violence:  No   Family History:  Family  History  Problem Relation Age of Onset   Sickle cell anemia Mother    Asthma Mother        smoker   Mental illness Mother    Coronary artery disease Father 23   Bladder Cancer Father    Post-traumatic stress disorder Brother    Lung cancer Paternal Grandmother 37       smoker   Prostate cancer Paternal Grandfather 26       deceased from prostate CA   Breast cancer Paternal Uncle    Stroke Neg Hx    Diabetes Neg Hx    Colon cancer Neg Hx    Colon polyps Neg Hx    Esophageal cancer Neg Hx    Rectal cancer Neg Hx    Stomach cancer Neg Hx     Living situation: the patient lives with their spouse  Sexual Orientation: Straight  Relationship Status: married  Name of spouse / other:Erik Smith If a parent, number of children / ages:2 boys, 5 & 15  Support Systems: family  Financial Stress:  No   Income/Employment/Disability: Employment  Financial Planner: unknown  Educational History: Education: post engineer, maintenance (it) work or degree  Religion/Sprituality/World View: unknown  Any cultural differences that may affect / interfere with treatment:  not applicable   Recreation/Hobbies: woodworking  Stressors: Occupational concerns    Strengths: Family  Barriers:  unknown   Legal History: Pending legal issue / charges: The patient has no significant history of legal issues. History of legal issue / charges: N/A  Medical History/Surgical History: not reviewed Past Medical History:  Diagnosis Date   Arthritis    Asthma    as a child   Bipolar disorder, unspecified (HCC)    hospitalization 2004 for depression, bipolar, ambien overdose   Cancer (HCC) 10/20/2015   skin ca on nose removed   Cholesteatoma 05/2010   s/p R ear surgery, residual tinnitus, planning on L ear surgery   Depression    bipolar   Family history of ischemic heart disease    History of asthma    Hyperlipidemia    Migraine without aura, without mention of intractable migraine without mention of  status migrainosus    Rosacea 2017   Spinal stenosis    Unspecified adjustment reaction     Past Surgical History:  Procedure Laterality Date   CARPOMETACARPEL SUSPENSION PLASTY Left 10/18/2017   Procedure: LEFT THUMB TRAPEZIECTOMY AND SUSPENSIONPLASTY;  Surgeon: Murrell Drivers, MD;  Location: [facility];  Service: Orthopedics;  Laterality: Left;   CARPOMETACARPEL SUSPENSION PLASTY Right 03/28/2018   Procedure: CARPOMETACARPEL [facility]) SUSPENSION PLASTY AND TRAPEZIECTOMY;  Surgeon: Murrell Drivers, MD;  Location:  SURGERY CENTER;  Service: Orthopedics;  Laterality: Right;   COLONOSCOPY  1999   normal (in [place] )   ESOPHAGUS SURGERY  2003   torn esophagus   INNER EAR SURGERY  1975, 1995, 2012, 2013   for cholesteatoma, bilateral first L then R   NECK SURGERY  2002   spinal stenosis and arthritis   perianal abscess  1998   SYNOVIAL CYST EXCISION     1987 approx    Medications: Current Outpatient Medications  Medication Sig Dispense Refill   ARIPiprazole  (ABILIFY ) 2 MG tablet Take 1 tablet (2 mg total) by mouth at bedtime. 90 tablet 0   atorvastatin  (LIPITOR) 20 MG tablet Take 1 tablet (20 mg total) by mouth daily. 90 tablet 3   doxycycline (VIBRAMYCIN) 100 MG capsule Take 100 mg by mouth 2 (two) times daily.     fluticasone  (FLONASE ) 50 MCG/ACT nasal spray Place 2 sprays into both nostrils daily. 16 g 0   ibuprofen  (ADVIL ) 200 MG tablet Take 3 tablets (600 mg total) by mouth 2 (two) times daily as needed for moderate pain.     lamoTRIgine  (LAMICTAL ) 150 MG tablet Take 1/2 tab twice daily 90 tablet 0   levocetirizine (XYZAL ) 5 MG tablet Take 1 tablet (5 mg total) by mouth every evening. 30 tablet 0   valACYclovir  (VALTREX ) 500 MG tablet TAKE 1 TABLET BY MOUTH TWICE DAILY 3 DAYS AT A TIME AS NEEDED 30 tablet 3   No current facility-administered medications for this visit.    Allergies  Allergen Reactions   Morphine And Codeine  Itching             Goals/Plan: Patient is seeking counseling for mood stabilization. Will utilize cognitive strategies to help manage feelings of anxiety and despair. Revised target date is 12-25 Monitor medication compliance -Ongoing.   Patient agrees to a video Public Affairs Consultant) meeting and is aware of platform limitations. He is at home and provider is in home office.  Session Notes: [place] talked about his son applying to college. He shared the struggles with his brother and mother. Things are relatively the same, but he is making slow progress to have access to his mother. He has his job applications on hold, but will wait until spring and will apply to jobs closer to his camper. Step son getting married in November.  Diagnoses:  Bipolar Disorder  Plan of Care: Outpatient Therapy and psychotropic medication   CONI ALM KERNS, PhD Time:4:15p-5:00p 45 min.

## 2024-04-14 ENCOUNTER — Ambulatory Visit: Admitting: Psychology

## 2024-04-14 DIAGNOSIS — F319 Bipolar disorder, unspecified: Secondary | ICD-10-CM
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
## 2024-04-16 ENCOUNTER — Encounter: Payer: Self-pay | Admitting: Family Medicine

## 2024-04-20 ENCOUNTER — Encounter: Payer: Self-pay | Admitting: Family Medicine

## 2024-04-25 MED ORDER — TIRZEPATIDE-WEIGHT MANAGEMENT 5 MG/0.5ML ~~LOC~~ SOLN: 5.0000 mg | SUBCUTANEOUS | 1 refills | Status: AC

## 2024-04-25 MED ORDER — TIRZEPATIDE-WEIGHT MANAGEMENT 2.5 MG/0.5ML ~~LOC~~ SOLN: 2.5000 mg | SUBCUTANEOUS | 0 refills | Status: DC

## 2024-04-25 NOTE — Telephone Encounter (Signed)
Already responded in other message.

## 2024-04-28 ENCOUNTER — Ambulatory Visit: Admitting: Psychology

## 2024-04-28 DIAGNOSIS — F319 Bipolar disorder, unspecified: Secondary | ICD-10-CM | POA: Diagnosis not present

## 2024-04-28 NOTE — Progress Notes (Signed)
 "                                 Name: Erik Smith Date: 04/28/2024 MRN: [id number] DOB: 10-29-1965 PCP: Rilla Baller, MD     [name] participated from home, via video, and consented to treatment. Therapist participated from home office.   Guardian/Payee:  N/A    Paperwork requested: No   Reason for Visit /Presenting Problem: Improve/manage feeling of agitation  Mental Status Exam: Appearance:   Fairly Groomed     Behavior:  Appropriate  Motor:  Normal  Speech/Language:   Normal Rate  Affect:  Appropriate  Mood:  normal  Thought process:  normal  Thought content:    WNL  Sensory/Perceptual disturbances:    WNL  Orientation:  oriented to person, place, and situation  Attention:  Good  Concentration:  Good  Memory:  WNL  Fund of knowledge:   Good  Insight:    Good  Judgment:   Good  Impulse Control:  Good     Reported Symptoms:  Agitation, frustration, worry  Risk Assessment: Danger to Self:  No Self-injurious Behavior: No Danger to Others: No Duty to Warn:no Physical Aggression / Violence:No  Access to Firearms a concern: unknown Gang Involvement:No  Patient / guardian was educated about steps to take if suicide or homicide risk level increases between visits: n/a While future psychiatric events cannot be accurately predicted, the patient does not currently require acute inpatient psychiatric care and does not currently meet [place]  involuntary commitment criteria.  Substance Abuse History: Current substance abuse: No     Past Psychiatric History:   Previous psychological history is significant for Bipolar Mania Outpatient Providers:Dr. Slater Darner History of Psych Hospitalization: Yes  Psychological Testing: N/A   Abuse History:  Victim of: No., N/A   Report needed: No. Victim of Neglect:No. Perpetrator of N/A  Witness / Exposure to Domestic Violence: No   Protective Services Involvement: No  Witness to  [employer] Violence:  No   Family History:  Family History  Problem Relation Age of Onset   Sickle cell anemia Mother    Asthma Mother        smoker   Mental illness Mother    Coronary artery disease Father 40   Bladder Cancer Father    Post-traumatic stress disorder Brother    Lung cancer Paternal Grandmother 44       smoker   Prostate cancer Paternal Grandfather 78       deceased from prostate CA   Breast cancer Paternal Uncle    Stroke Neg Hx    Diabetes Neg Hx    Colon cancer Neg Hx    Colon polyps Neg Hx    Esophageal cancer Neg Hx    Rectal cancer Neg Hx    Stomach cancer Neg Hx     Living situation: the patient lives with their spouse  Sexual Orientation: Straight  Relationship Status: married  Name of spouse / other:Erik Smith If a parent, number of children / ages:2 boys, 66 & 15  Support Systems: family  Financial Stress:  No   Income/Employment/Disability: Employment  Financial Planner: unknown  Educational History: Education: post engineer, maintenance (it) work or degree  Religion/Sprituality/World View: unknown  Any cultural differences that may affect / interfere with treatment:  not applicable   Recreation/Hobbies: woodworking  Stressors: Occupational concerns    Strengths: Family  Barriers:  unknown   Legal History:  Pending legal issue / charges: The patient has no significant history of legal issues. History of legal issue / charges: N/A  Medical History/Surgical History: not reviewed Past Medical History:  Diagnosis Date   Arthritis    Asthma    as a child   Bipolar disorder, unspecified (HCC)    hospitalization 2004 for depression, bipolar, ambien overdose   Cancer (HCC) 10/20/2015   skin ca on nose removed   Cholesteatoma 05/2010   s/p R ear surgery, residual tinnitus, planning on L ear surgery   Depression    bipolar   Family history of ischemic heart disease    History of asthma    Hyperlipidemia    Migraine without aura, without  mention of intractable migraine without mention of status migrainosus    Rosacea 2017   Spinal stenosis    Unspecified adjustment reaction     Past Surgical History:  Procedure Laterality Date   CARPOMETACARPEL SUSPENSION PLASTY Left 10/18/2017   Procedure: LEFT THUMB TRAPEZIECTOMY AND SUSPENSIONPLASTY;  Surgeon: Murrell Drivers, MD;  Location: [facility];  Service: Orthopedics;  Laterality: Left;   CARPOMETACARPEL SUSPENSION PLASTY Right 03/28/2018   Procedure: CARPOMETACARPEL [facility]) SUSPENSION PLASTY AND TRAPEZIECTOMY;  Surgeon: Murrell Drivers, MD;  Location: [facility];  Service: Orthopedics;  Laterality: Right;   COLONOSCOPY  1999   normal (in [place] )   ESOPHAGUS SURGERY  2003   torn esophagus   INNER EAR SURGERY  1975, 1995, 2012, 2013   for cholesteatoma, bilateral first L then R   NECK SURGERY  2002   spinal stenosis and arthritis   perianal abscess  1998   SYNOVIAL CYST EXCISION     1987 approx    Medications: Current Outpatient Medications  Medication Sig Dispense Refill   ARIPiprazole  (ABILIFY ) 2 MG tablet Take 1 tablet (2 mg total) by mouth at bedtime. 90 tablet 0   atorvastatin  (LIPITOR) 20 MG tablet Take 1 tablet (20 mg total) by mouth daily. 90 tablet 3   doxycycline (VIBRAMYCIN) 100 MG capsule Take 100 mg by mouth 2 (two) times daily.     fluticasone  (FLONASE ) 50 MCG/ACT nasal spray Place 2 sprays into both nostrils daily. 16 g 0   ibuprofen  (ADVIL ) 200 MG tablet Take 3 tablets (600 mg total) by mouth 2 (two) times daily as needed for moderate pain.     lamoTRIgine  (LAMICTAL ) 150 MG tablet Take 1/2 tab twice daily 90 tablet 0   levocetirizine (XYZAL ) 5 MG tablet Take 1 tablet (5 mg total) by mouth every evening. 30 tablet 0   tirzepatide  (ZEPBOUND ) 2.5 MG/0.5ML injection vial Inject 2.5 mg into the skin once a week. 2 mL 0   [START ON 05/23/2024] tirzepatide  5 MG/0.5ML injection vial Inject 5 mg into the skin once a week. 2 mL 1    valACYclovir  (VALTREX ) 500 MG tablet TAKE 1 TABLET BY MOUTH TWICE DAILY 3 DAYS AT A TIME AS NEEDED 30 tablet 3   No current facility-administered medications for this visit.    Allergies  Allergen Reactions   Morphine And Codeine  Itching            Goals/Plan: Patient is seeking counseling for mood stabilization. Will utilize cognitive strategies to help manage feelings of anxiety and despair. He is also struggling to understand why there are trust issues between he and his mother and brother. Will utilize family systems theories to better understand FOO and relationship dynamics.  Revised target date is 12-26 Monitor medication compliance -Ongoing.  Patient agrees to a video Public Affairs Consultant) meeting and is aware of platform limitations. He is at home and provider is in home office.  Session Notes: Zakar is very frustrated because his brother is blocking his access to their mother. He is trying to have regular contact with his mother and cannot understand why his brother is obstructing their communication. We talked about the most appropriate response, which is to enlist legal support rather than having direct conflict with his brother. These situations contribute to his feelings of powerlessness and agitation.                                                                                                                                                                  Diagnoses:  Bipolar Disorder  Plan of Care: Outpatient Therapy and psychotropic medication   CONI ALM KERNS, PhD Time:4:15p-5:00p 45 min.     "

## 2024-05-12 ENCOUNTER — Ambulatory Visit: Admitting: Psychology

## 2024-05-12 DIAGNOSIS — F319 Bipolar disorder, unspecified: Secondary | ICD-10-CM | POA: Diagnosis not present

## 2024-05-12 NOTE — Progress Notes (Signed)
 "                                                Name: Erik Smith Date: 05/12/2024 MRN: [id number] DOB: May 31, 1965 PCP: Rilla Baller, MD     [name] participated from home, via video, and consented to treatment. Therapist participated from home office.   Guardian/Payee:  N/A    Paperwork requested: No   Reason for Visit /Presenting Problem: Improve/manage feeling of agitation  Mental Status Exam: Appearance:   Fairly Groomed     Behavior:  Appropriate  Motor:  Normal  Speech/Language:   Normal Rate  Affect:  Appropriate  Mood:  normal  Thought process:  normal  Thought content:    WNL  Sensory/Perceptual disturbances:    WNL  Orientation:  oriented to person, place, and situation  Attention:  Good  Concentration:  Good  Memory:  WNL  Fund of knowledge:   Good  Insight:    Good  Judgment:   Good  Impulse Control:  Good     Reported Symptoms:  Agitation, frustration, worry  Risk Assessment: Danger to Self:  No Self-injurious Behavior: No Danger to Others: No Duty to Warn:no Physical Aggression / Violence:No  Access to Firearms a concern: unknown Gang Involvement:No  Patient / guardian was educated about steps to take if suicide or homicide risk level increases between visits: n/a While future psychiatric events cannot be accurately predicted, the patient does not currently require acute inpatient psychiatric care and does not currently meet [place]  involuntary commitment criteria.  Substance Abuse History: Current substance abuse: No     Past Psychiatric History:   Previous psychological history is significant for Bipolar Mania Outpatient Providers:Dr. Slater Darner History of Psych Hospitalization: Yes  Psychological Testing: N/A   Abuse History:  Victim of: No., N/A   Report needed: No. Victim of Neglect:No. Perpetrator of N/A  Witness / Exposure to Domestic Violence: No   Protective Services  Involvement: No  Witness to [employer] Violence:  No   Family History:  Family History  Problem Relation Age of Onset   Sickle cell anemia Mother    Asthma Mother        smoker   Mental illness Mother    Coronary artery disease Father 66   Bladder Cancer Father    Post-traumatic stress disorder Brother    Lung cancer Paternal Grandmother 70       smoker   Prostate cancer Paternal Grandfather 19       deceased from prostate CA   Breast cancer Paternal Uncle    Stroke Neg Hx    Diabetes Neg Hx    Colon cancer Neg Hx    Colon polyps Neg Hx    Esophageal cancer Neg Hx    Rectal cancer Neg Hx    Stomach cancer Neg Hx     Living situation: the patient lives with their spouse  Sexual Orientation: Straight  Relationship Status: married  Name of spouse / other:Erik Smith If a parent, number of children / ages:2 boys, 75 & 15  Support Systems: family  Financial Stress:  No   Income/Employment/Disability: Employment  Financial Planner: unknown  Educational History: Education: post engineer, maintenance (it) work or degree  Religion/Sprituality/World View: unknown  Any cultural differences that may affect / interfere with treatment:  not applicable   Recreation/Hobbies: woodworking  Stressors:  Occupational concerns    Strengths: Family  Barriers:  unknown   Legal History: Pending legal issue / charges: The patient has no significant history of legal issues. History of legal issue / charges: N/A  Medical History/Surgical History: not reviewed Past Medical History:  Diagnosis Date   Arthritis    Asthma    as a child   Bipolar disorder, unspecified (HCC)    hospitalization 2004 for depression, bipolar, ambien overdose   Cancer (HCC) 10/20/2015   skin ca on nose removed   Cholesteatoma 05/2010   s/p R ear surgery, residual tinnitus, planning on L ear surgery   Depression    bipolar   Family history of ischemic heart disease    History of asthma    Hyperlipidemia     Migraine without aura, without mention of intractable migraine without mention of status migrainosus    Rosacea 2017   Spinal stenosis    Unspecified adjustment reaction     Past Surgical History:  Procedure Laterality Date   CARPOMETACARPEL SUSPENSION PLASTY Left 10/18/2017   Procedure: LEFT THUMB TRAPEZIECTOMY AND SUSPENSIONPLASTY;  Surgeon: Murrell Drivers, MD;  Location: [facility];  Service: Orthopedics;  Laterality: Left;   CARPOMETACARPEL SUSPENSION PLASTY Right 03/28/2018   Procedure: CARPOMETACARPEL [facility]) SUSPENSION PLASTY AND TRAPEZIECTOMY;  Surgeon: Murrell Drivers, MD;  Location: [facility];  Service: Orthopedics;  Laterality: Right;   COLONOSCOPY  1999   normal (in [place] )   ESOPHAGUS SURGERY  2003   torn esophagus   INNER EAR SURGERY  1975, 1995, 2012, 2013   for cholesteatoma, bilateral first L then R   NECK SURGERY  2002   spinal stenosis and arthritis   perianal abscess  1998   SYNOVIAL CYST EXCISION     1987 approx    Medications: Current Outpatient Medications  Medication Sig Dispense Refill   ARIPiprazole  (ABILIFY ) 2 MG tablet Take 1 tablet (2 mg total) by mouth at bedtime. 90 tablet 0   atorvastatin  (LIPITOR) 20 MG tablet Take 1 tablet (20 mg total) by mouth daily. 90 tablet 3   doxycycline (VIBRAMYCIN) 100 MG capsule Take 100 mg by mouth 2 (two) times daily.     fluticasone  (FLONASE ) 50 MCG/ACT nasal spray Place 2 sprays into both nostrils daily. 16 g 0   ibuprofen  (ADVIL ) 200 MG tablet Take 3 tablets (600 mg total) by mouth 2 (two) times daily as needed for moderate pain.     lamoTRIgine  (LAMICTAL ) 150 MG tablet Take 1/2 tab twice daily 90 tablet 0   levocetirizine (XYZAL ) 5 MG tablet Take 1 tablet (5 mg total) by mouth every evening. 30 tablet 0   tirzepatide  (ZEPBOUND ) 2.5 MG/0.5ML injection vial Inject 2.5 mg into the skin once a week. 2 mL 0   [START ON 05/23/2024] tirzepatide  5 MG/0.5ML injection vial Inject 5 mg into the  skin once a week. 2 mL 1   valACYclovir  (VALTREX ) 500 MG tablet TAKE 1 TABLET BY MOUTH TWICE DAILY 3 DAYS AT A TIME AS NEEDED 30 tablet 3   No current facility-administered medications for this visit.    Allergies  Allergen Reactions   Morphine And Codeine  Itching            Goals/Plan: Patient is seeking counseling for mood stabilization. Will utilize cognitive strategies to help manage feelings of anxiety and despair. He is also struggling to understand why there are trust issues between he and his mother and brother. Will utilize family systems theories to better  understand FOO and relationship dynamics.  Revised target date is 12-26 Monitor medication compliance -Ongoing.   Patient agrees to a video Public Affairs Consultant) meeting and is aware of platform limitations. He is at home and provider is in home office.  Session Notes: Cassey said everything for the holiday worked out well. Their daughter Saddie was with them and his son hung around for one of the days for Christmas. He and wife went to [place] for New [place]'s Eve and day. He can actually speak with mother now that she got a new phone system at her home. Moods and meds stable.                                                                                                                                                                    Diagnoses:  Bipolar Disorder  Plan of Care: Outpatient Therapy and psychotropic medication   CONI ALM KERNS, PhD Time:4:15p-5:00p 45 min.     "

## 2024-05-26 ENCOUNTER — Ambulatory Visit: Admitting: Psychology

## 2024-06-09 ENCOUNTER — Other Ambulatory Visit: Payer: Self-pay | Admitting: Family Medicine

## 2024-06-09 ENCOUNTER — Ambulatory Visit: Admitting: Psychology

## 2024-06-09 DIAGNOSIS — F319 Bipolar disorder, unspecified: Secondary | ICD-10-CM

## 2024-06-23 ENCOUNTER — Ambulatory Visit: Admitting: Psychology

## 2024-07-07 ENCOUNTER — Ambulatory Visit: Admitting: Psychology

## 2024-07-21 ENCOUNTER — Ambulatory Visit: Admitting: Psychology

## 2024-08-04 ENCOUNTER — Ambulatory Visit: Admitting: Psychology

## 2024-08-18 ENCOUNTER — Ambulatory Visit: Admitting: Psychology

## 2024-09-01 ENCOUNTER — Ambulatory Visit: Admitting: Psychology

## 2024-11-06 ENCOUNTER — Other Ambulatory Visit

## 2024-11-14 ENCOUNTER — Encounter: Admitting: Family Medicine
